# Patient Record
Sex: Female | Born: 1955 | Race: Black or African American | Hispanic: No | Smoking: Current every day smoker
Health system: Southern US, Community
[De-identification: ages and names within clinical notes are randomized; demographics above are authoritative.]

## PROBLEM LIST (undated history)

## (undated) DIAGNOSIS — R05 Cough: Secondary | ICD-10-CM

## (undated) DIAGNOSIS — J439 Emphysema, unspecified: Secondary | ICD-10-CM

## (undated) DIAGNOSIS — M549 Dorsalgia, unspecified: Secondary | ICD-10-CM

## (undated) DIAGNOSIS — M25569 Pain in unspecified knee: Secondary | ICD-10-CM

## (undated) DIAGNOSIS — R059 Cough, unspecified: Secondary | ICD-10-CM

## (undated) DIAGNOSIS — F32A Depression, unspecified: Secondary | ICD-10-CM

## (undated) DIAGNOSIS — F329 Major depressive disorder, single episode, unspecified: Secondary | ICD-10-CM

## (undated) DIAGNOSIS — G8929 Other chronic pain: Secondary | ICD-10-CM

## (undated) DIAGNOSIS — R0600 Dyspnea, unspecified: Secondary | ICD-10-CM

## (undated) DIAGNOSIS — C3490 Malignant neoplasm of unspecified part of unspecified bronchus or lung: Secondary | ICD-10-CM

## (undated) DIAGNOSIS — B182 Chronic viral hepatitis C: Secondary | ICD-10-CM

## (undated) DIAGNOSIS — M25519 Pain in unspecified shoulder: Secondary | ICD-10-CM

## (undated) DIAGNOSIS — R011 Cardiac murmur, unspecified: Secondary | ICD-10-CM

## (undated) DIAGNOSIS — Z9851 Tubal ligation status: Secondary | ICD-10-CM

## (undated) DIAGNOSIS — R1310 Dysphagia, unspecified: Secondary | ICD-10-CM

## (undated) DIAGNOSIS — I10 Essential (primary) hypertension: Secondary | ICD-10-CM

## (undated) HISTORY — DX: Cough, unspecified: R05.9

## (undated) HISTORY — DX: Dorsalgia, unspecified: M54.9

## (undated) HISTORY — DX: Chronic viral hepatitis C: B18.2

## (undated) HISTORY — PX: LUNG BIOPSY: SHX232

## (undated) HISTORY — DX: Emphysema, unspecified: J43.9

## (undated) HISTORY — DX: Malignant neoplasm of unspecified part of unspecified bronchus or lung: C34.90

## (undated) HISTORY — DX: Depression, unspecified: F32.A

## (undated) HISTORY — PX: HEMORROIDECTOMY: SUR656

## (undated) HISTORY — DX: Major depressive disorder, single episode, unspecified: F32.9

## (undated) HISTORY — DX: Dysphagia, unspecified: R13.10

## (undated) HISTORY — PX: TUBAL LIGATION: SHX77

## (undated) HISTORY — DX: Other chronic pain: G89.29

## (undated) HISTORY — DX: Dyspnea, unspecified: R06.00

## (undated) HISTORY — DX: Cough: R05

## (undated) HISTORY — DX: Cardiac murmur, unspecified: R01.1

---

## 2004-08-12 ENCOUNTER — Ambulatory Visit: Payer: Self-pay

## 2005-07-20 ENCOUNTER — Emergency Department: Payer: Self-pay | Admitting: Emergency Medicine

## 2005-07-22 ENCOUNTER — Emergency Department: Payer: Self-pay | Admitting: Emergency Medicine

## 2005-07-23 ENCOUNTER — Other Ambulatory Visit: Payer: Self-pay

## 2005-09-17 ENCOUNTER — Emergency Department: Payer: Self-pay | Admitting: Internal Medicine

## 2005-09-22 ENCOUNTER — Emergency Department: Payer: Self-pay | Admitting: Emergency Medicine

## 2005-10-17 ENCOUNTER — Emergency Department: Payer: Self-pay | Admitting: Emergency Medicine

## 2006-01-26 ENCOUNTER — Emergency Department: Payer: Self-pay | Admitting: Emergency Medicine

## 2006-05-24 ENCOUNTER — Emergency Department: Payer: Self-pay | Admitting: Internal Medicine

## 2007-04-03 ENCOUNTER — Other Ambulatory Visit: Payer: Self-pay

## 2007-04-03 ENCOUNTER — Inpatient Hospital Stay: Payer: Self-pay | Admitting: Internal Medicine

## 2008-02-06 ENCOUNTER — Emergency Department: Payer: Self-pay | Admitting: Emergency Medicine

## 2008-11-08 ENCOUNTER — Emergency Department: Payer: Self-pay | Admitting: Emergency Medicine

## 2009-11-06 ENCOUNTER — Ambulatory Visit: Payer: Self-pay | Admitting: Adult Health

## 2011-05-02 ENCOUNTER — Inpatient Hospital Stay: Payer: Self-pay | Admitting: Internal Medicine

## 2011-07-08 ENCOUNTER — Ambulatory Visit: Payer: Self-pay | Admitting: Internal Medicine

## 2011-07-17 DIAGNOSIS — C3490 Malignant neoplasm of unspecified part of unspecified bronchus or lung: Secondary | ICD-10-CM

## 2011-07-17 HISTORY — DX: Malignant neoplasm of unspecified part of unspecified bronchus or lung: C34.90

## 2011-07-21 ENCOUNTER — Ambulatory Visit: Payer: Self-pay | Admitting: Oncology

## 2011-07-21 LAB — CBC CANCER CENTER
Basophil %: 0.9 %
Eosinophil #: 0 x10 3/mm (ref 0.0–0.7)
Eosinophil %: 0.5 %
HGB: 15.5 g/dL (ref 12.0–16.0)
Lymphocyte #: 1.5 x10 3/mm (ref 1.0–3.6)
Lymphocyte %: 24.9 %
MCHC: 34.3 g/dL (ref 32.0–36.0)
Monocyte #: 0.8 x10 3/mm — ABNORMAL HIGH (ref 0.0–0.7)
Monocyte %: 13.4 %
Neutrophil #: 3.6 x10 3/mm (ref 1.4–6.5)
Neutrophil %: 60.3 %
RDW: 14.6 % — ABNORMAL HIGH (ref 11.5–14.5)
WBC: 6 x10 3/mm (ref 3.6–11.0)

## 2011-07-21 LAB — PROTIME-INR
INR: 1
Prothrombin Time: 14 secs (ref 11.5–14.7)

## 2011-07-22 LAB — CEA: CEA: 2.5 ng/mL (ref 0.0–4.7)

## 2011-07-23 ENCOUNTER — Ambulatory Visit: Payer: Self-pay | Admitting: Oncology

## 2011-08-03 ENCOUNTER — Ambulatory Visit: Payer: Self-pay | Admitting: Oncology

## 2011-08-03 LAB — PROTIME-INR
INR: 1.1
Prothrombin Time: 14.2 secs (ref 11.5–14.7)

## 2011-08-03 LAB — APTT: Activated PTT: 26.6 secs (ref 23.6–35.9)

## 2011-08-07 LAB — PATHOLOGY REPORT

## 2011-08-17 ENCOUNTER — Ambulatory Visit: Payer: Self-pay | Admitting: Oncology

## 2011-08-19 LAB — CBC CANCER CENTER
Basophil %: 0.4 %
Eosinophil #: 0 x10 3/mm (ref 0.0–0.7)
Eosinophil %: 0.4 %
HCT: 41.7 % (ref 35.0–47.0)
HGB: 14.5 g/dL (ref 12.0–16.0)
Lymphocyte #: 1 x10 3/mm (ref 1.0–3.6)
MCV: 88 fL (ref 80–100)
Neutrophil %: 65.9 %
Platelet: 204 x10 3/mm (ref 150–440)
RBC: 4.74 10*6/uL (ref 3.80–5.20)
WBC: 4.7 x10 3/mm (ref 3.6–11.0)

## 2011-08-19 LAB — BASIC METABOLIC PANEL
Anion Gap: 11 (ref 7–16)
BUN: 9 mg/dL (ref 7–18)
Chloride: 96 mmol/L — ABNORMAL LOW (ref 98–107)
Co2: 25 mmol/L (ref 21–32)
EGFR (African American): >60
Glucose: 129 mg/dL — ABNORMAL HIGH (ref 65–99)
Potassium: 3.2 mmol/L — ABNORMAL LOW (ref 3.5–5.1)
Sodium: 132 mmol/L — ABNORMAL LOW (ref 136–145)

## 2011-08-26 LAB — BASIC METABOLIC PANEL
Calcium, Total: 8.8 mg/dL (ref 8.5–10.1)
Chloride: 102 mmol/L (ref 98–107)
EGFR (African American): >60
EGFR (Non-African Amer.): >60
Glucose: 122 mg/dL — ABNORMAL HIGH (ref 65–99)
Potassium: 3.6 mmol/L (ref 3.5–5.1)
Sodium: 137 mmol/L (ref 136–145)

## 2011-08-26 LAB — CBC CANCER CENTER
Basophil #: 0 x10 3/mm (ref 0.0–0.1)
Basophil %: 0.7 %
Eosinophil #: 0 x10 3/mm (ref 0.0–0.7)
Eosinophil %: 1.1 %
HGB: 13.6 g/dL (ref 12.0–16.0)
Lymphocyte %: 15.8 %
MCH: 30.7 pg (ref 26.0–34.0)
MCHC: 34.6 g/dL (ref 32.0–36.0)
MCV: 89 fL (ref 80–100)
Neutrophil %: 72.9 %
Platelet: 289 x10 3/mm (ref 150–440)
RBC: 4.42 10*6/uL (ref 3.80–5.20)
RDW: 14.8 % — ABNORMAL HIGH (ref 11.5–14.5)

## 2011-09-02 LAB — CBC CANCER CENTER
Basophil %: 0.5 %
Eosinophil %: 0.6 %
HCT: 39.8 % (ref 35.0–47.0)
HGB: 13 g/dL (ref 12.0–16.0)
Lymphocyte %: 11.8 %
MCH: 29.7 pg (ref 26.0–34.0)
MCHC: 32.7 g/dL (ref 32.0–36.0)
Monocyte #: 0.3 x10 3/mm (ref 0.2–0.9)
Monocyte %: 13.5 %
Neutrophil #: 1.8 x10 3/mm (ref 1.4–6.5)
Neutrophil %: 73.6 %
WBC: 2.5 x10 3/mm — ABNORMAL LOW (ref 3.6–11.0)

## 2011-09-02 LAB — BASIC METABOLIC PANEL
Anion Gap: 11 (ref 7–16)
BUN: 9 mg/dL (ref 7–18)
Chloride: 99 mmol/L (ref 98–107)
Glucose: 168 mg/dL — ABNORMAL HIGH (ref 65–99)
Osmolality: 275 (ref 275–301)
Potassium: 3.6 mmol/L (ref 3.5–5.1)
Sodium: 136 mmol/L (ref 136–145)

## 2011-09-09 LAB — COMPREHENSIVE METABOLIC PANEL
Albumin: 3.3 g/dL — ABNORMAL LOW (ref 3.4–5.0)
Alkaline Phosphatase: 247 U/L — ABNORMAL HIGH (ref 50–136)
Anion Gap: 8 (ref 7–16)
BUN: 13 mg/dL (ref 7–18)
Bilirubin,Total: 0.4 mg/dL (ref 0.2–1.0)
Co2: 29 mmol/L (ref 21–32)
Creatinine: 0.51 mg/dL — ABNORMAL LOW (ref 0.60–1.30)
EGFR (African American): >60
EGFR (Non-African Amer.): >60
Glucose: 122 mg/dL — ABNORMAL HIGH (ref 65–99)
Osmolality: 281 (ref 275–301)
Potassium: 3.8 mmol/L (ref 3.5–5.1)
SGOT(AST): 66 U/L — ABNORMAL HIGH (ref 15–37)
Sodium: 140 mmol/L (ref 136–145)

## 2011-09-09 LAB — APTT: Activated PTT: 25.8 secs (ref 23.6–35.9)

## 2011-09-09 LAB — CBC CANCER CENTER
Eosinophil %: 0.1 %
HCT: 38.6 % (ref 35.0–47.0)
HGB: 12.9 g/dL (ref 12.0–16.0)
Lymphocyte #: 0.4 x10 3/mm — ABNORMAL LOW (ref 1.0–3.6)
MCH: 30.3 pg (ref 26.0–34.0)
MCHC: 33.4 g/dL (ref 32.0–36.0)
MCV: 91 fL (ref 80–100)
Monocyte #: 0.4 x10 3/mm (ref 0.2–0.9)
Neutrophil %: 72.8 %
RBC: 4.24 10*6/uL (ref 3.80–5.20)
RDW: 14.7 % — ABNORMAL HIGH (ref 11.5–14.5)

## 2011-09-09 LAB — PROTIME-INR
INR: 1
Prothrombin Time: 13.1 secs (ref 11.5–14.7)

## 2011-09-16 ENCOUNTER — Ambulatory Visit: Payer: Self-pay | Admitting: Oncology

## 2011-09-16 ENCOUNTER — Ambulatory Visit: Payer: Self-pay

## 2011-09-16 LAB — CBC CANCER CENTER
Basophil #: 0 x10 3/mm (ref 0.0–0.1)
Basophil %: 0.8 %
Eosinophil #: 0 x10 3/mm (ref 0.0–0.7)
Eosinophil %: 0.3 %
HGB: 12.3 g/dL (ref 12.0–16.0)
Lymphocyte #: 0.3 x10 3/mm — ABNORMAL LOW (ref 1.0–3.6)
Lymphocyte %: 14.2 %
MCH: 30.4 pg (ref 26.0–34.0)
MCHC: 33.5 g/dL (ref 32.0–36.0)
Monocyte #: 0.3 x10 3/mm (ref 0.2–0.9)
Monocyte %: 18 %
Neutrophil #: 1.3 x10 3/mm — ABNORMAL LOW (ref 1.4–6.5)
Neutrophil %: 66.7 %
Platelet: 202 x10 3/mm (ref 150–440)
RBC: 4.06 10*6/uL (ref 3.80–5.20)
RDW: 15.3 % — ABNORMAL HIGH (ref 11.5–14.5)
WBC: 1.9 x10 3/mm — CL (ref 3.6–11.0)

## 2011-09-16 LAB — COMPREHENSIVE METABOLIC PANEL
Alkaline Phosphatase: 200 U/L — ABNORMAL HIGH (ref 50–136)
BUN: 10 mg/dL (ref 7–18)
Bilirubin,Total: 0.5 mg/dL (ref 0.2–1.0)
Calcium, Total: 9 mg/dL (ref 8.5–10.1)
Chloride: 106 mmol/L (ref 98–107)
Co2: 24 mmol/L (ref 21–32)
EGFR (Non-African Amer.): >60
Total Protein: 8.2 g/dL (ref 6.4–8.2)

## 2011-09-16 LAB — PROTIME-INR: INR: 0.8

## 2011-09-18 LAB — CBC CANCER CENTER
Basophil #: 0 x10 3/mm (ref 0.0–0.1)
Basophil %: 0.4 %
Eosinophil %: 0.2 %
HCT: 37.5 % (ref 35.0–47.0)
HGB: 12.5 g/dL (ref 12.0–16.0)
Lymphocyte #: 0.4 x10 3/mm — ABNORMAL LOW (ref 1.0–3.6)
Lymphocyte %: 15 %
MCHC: 33.3 g/dL (ref 32.0–36.0)
MCV: 91 fL (ref 80–100)
RBC: 4.11 10*6/uL (ref 3.80–5.20)

## 2011-09-18 LAB — COMPREHENSIVE METABOLIC PANEL
Albumin: 3.4 g/dL (ref 3.4–5.0)
Alkaline Phosphatase: 228 U/L — ABNORMAL HIGH (ref 50–136)
BUN: 9 mg/dL (ref 7–18)
Bilirubin,Total: 0.6 mg/dL (ref 0.2–1.0)
Calcium, Total: 9.2 mg/dL (ref 8.5–10.1)
Chloride: 102 mmol/L (ref 98–107)
Co2: 25 mmol/L (ref 21–32)
Creatinine: 0.59 mg/dL — ABNORMAL LOW (ref 0.60–1.30)
EGFR (African American): >60
Glucose: 102 mg/dL — ABNORMAL HIGH (ref 65–99)
Osmolality: 275 (ref 275–301)
Potassium: 3.8 mmol/L (ref 3.5–5.1)
SGOT(AST): 92 U/L — ABNORMAL HIGH (ref 15–37)
SGPT (ALT): 87 U/L — ABNORMAL HIGH
Total Protein: 8.4 g/dL — ABNORMAL HIGH (ref 6.4–8.2)

## 2011-09-23 LAB — CBC CANCER CENTER
Basophil #: 0 x10 3/mm (ref 0.0–0.1)
Basophil %: 0.7 %
Eosinophil #: 0 x10 3/mm (ref 0.0–0.7)
Eosinophil %: 0.4 %
HCT: 35.9 % (ref 35.0–47.0)
HGB: 12 g/dL (ref 12.0–16.0)
Lymphocyte #: 0.3 x10 3/mm — ABNORMAL LOW (ref 1.0–3.6)
Lymphocyte %: 15.4 %
MCH: 30.8 pg (ref 26.0–34.0)
MCHC: 33.4 g/dL (ref 32.0–36.0)
MCV: 92 fL (ref 80–100)
Monocyte #: 0.1 x10 3/mm — ABNORMAL LOW (ref 0.2–0.9)
Monocyte %: 8.4 %
Neutrophil #: 1.2 x10 3/mm — ABNORMAL LOW (ref 1.4–6.5)
Neutrophil %: 75.1 %
Platelet: 187 x10 3/mm (ref 150–440)
RBC: 3.89 10*6/uL (ref 3.80–5.20)
RDW: 16.4 % — ABNORMAL HIGH (ref 11.5–14.5)
WBC: 1.6 x10 3/mm — CL (ref 3.6–11.0)

## 2011-09-24 ENCOUNTER — Ambulatory Visit: Payer: Self-pay | Admitting: Vascular Surgery

## 2011-09-30 LAB — COMPREHENSIVE METABOLIC PANEL
Alkaline Phosphatase: 197 U/L — ABNORMAL HIGH (ref 50–136)
Anion Gap: 10 (ref 7–16)
Bilirubin,Total: 0.5 mg/dL (ref 0.2–1.0)
Calcium, Total: 8.8 mg/dL (ref 8.5–10.1)
Chloride: 103 mmol/L (ref 98–107)
Creatinine: 0.59 mg/dL — ABNORMAL LOW (ref 0.60–1.30)
EGFR (African American): >60
Osmolality: 281 (ref 275–301)
Potassium: 4 mmol/L (ref 3.5–5.1)
Total Protein: 8.1 g/dL (ref 6.4–8.2)

## 2011-09-30 LAB — CBC CANCER CENTER
Basophil #: 0 x10 3/mm (ref 0.0–0.1)
HCT: 35.5 % (ref 35.0–47.0)
Lymphocyte #: 0.4 x10 3/mm — ABNORMAL LOW (ref 1.0–3.6)
MCHC: 33.5 g/dL (ref 32.0–36.0)
MCV: 94 fL (ref 80–100)
Neutrophil #: 1.2 x10 3/mm — ABNORMAL LOW (ref 1.4–6.5)
Neutrophil %: 53.2 %
WBC: 2.3 x10 3/mm — ABNORMAL LOW (ref 3.6–11.0)

## 2011-10-07 LAB — COMPREHENSIVE METABOLIC PANEL
Alkaline Phosphatase: 163 U/L — ABNORMAL HIGH (ref 50–136)
BUN: 12 mg/dL (ref 7–18)
Calcium, Total: 9 mg/dL (ref 8.5–10.1)
Chloride: 103 mmol/L (ref 98–107)
EGFR (African American): >60
EGFR (Non-African Amer.): >60
Glucose: 103 mg/dL — ABNORMAL HIGH (ref 65–99)
SGPT (ALT): 90 U/L — ABNORMAL HIGH
Total Protein: 7.9 g/dL (ref 6.4–8.2)

## 2011-10-07 LAB — CBC CANCER CENTER
Eosinophil %: 0.3 %
HCT: 34.7 % — ABNORMAL LOW (ref 35.0–47.0)
HGB: 11.6 g/dL — ABNORMAL LOW (ref 12.0–16.0)
Lymphocyte #: 0.3 x10 3/mm — ABNORMAL LOW (ref 1.0–3.6)
Lymphocyte %: 12.3 %
Monocyte #: 0.3 x10 3/mm (ref 0.2–0.9)
Monocyte %: 12.5 %
Neutrophil %: 74.8 %
RDW: 20.6 % — ABNORMAL HIGH (ref 11.5–14.5)
WBC: 2.7 x10 3/mm — ABNORMAL LOW (ref 3.6–11.0)

## 2011-10-17 ENCOUNTER — Ambulatory Visit: Payer: Self-pay | Admitting: Oncology

## 2011-11-04 LAB — CBC CANCER CENTER
Basophil #: 0 x10 3/mm (ref 0.0–0.1)
Eosinophil #: 0 x10 3/mm (ref 0.0–0.7)
HCT: 39.4 % (ref 35.0–47.0)
Monocyte #: 0.5 x10 3/mm (ref 0.2–0.9)
Neutrophil #: 2.1 x10 3/mm (ref 1.4–6.5)
Neutrophil %: 57.5 %
Platelet: 158 x10 3/mm (ref 150–440)
RBC: 3.91 10*6/uL (ref 3.80–5.20)
RDW: 21.5 % — ABNORMAL HIGH (ref 11.5–14.5)

## 2011-11-04 LAB — COMPREHENSIVE METABOLIC PANEL
Albumin: 3.6 g/dL (ref 3.4–5.0)
Anion Gap: 9 (ref 7–16)
BUN: 12 mg/dL (ref 7–18)
Bilirubin,Total: 0.6 mg/dL (ref 0.2–1.0)
Calcium, Total: 9.5 mg/dL (ref 8.5–10.1)
Chloride: 103 mmol/L (ref 98–107)
EGFR (Non-African Amer.): >60
Glucose: 99 mg/dL (ref 65–99)
Potassium: 4.2 mmol/L (ref 3.5–5.1)
SGPT (ALT): 66 U/L
Sodium: 136 mmol/L (ref 136–145)
Total Protein: 8.7 g/dL — ABNORMAL HIGH (ref 6.4–8.2)

## 2011-11-16 ENCOUNTER — Ambulatory Visit: Payer: Self-pay | Admitting: Oncology

## 2011-12-02 LAB — CBC CANCER CENTER
Basophil #: 0 x10 3/mm (ref 0.0–0.1)
Eosinophil #: 0 x10 3/mm (ref 0.0–0.7)
Eosinophil %: 1.1 %
HCT: 42.9 % (ref 35.0–47.0)
HGB: 14.4 g/dL (ref 12.0–16.0)
MCH: 34.8 pg — ABNORMAL HIGH (ref 26.0–34.0)
MCHC: 33.6 g/dL (ref 32.0–36.0)
MCV: 104 fL — ABNORMAL HIGH (ref 80–100)
Monocyte #: 0.4 x10 3/mm (ref 0.2–0.9)
Platelet: 176 x10 3/mm (ref 150–440)
RDW: 15.7 % — ABNORMAL HIGH (ref 11.5–14.5)

## 2011-12-02 LAB — COMPREHENSIVE METABOLIC PANEL
Albumin: 3.6 g/dL (ref 3.4–5.0)
Alkaline Phosphatase: 186 U/L — ABNORMAL HIGH (ref 50–136)
Bilirubin,Total: 0.6 mg/dL (ref 0.2–1.0)
Co2: 24 mmol/L (ref 21–32)
Creatinine: 0.66 mg/dL (ref 0.60–1.30)
EGFR (Non-African Amer.): >60
Glucose: 94 mg/dL (ref 65–99)
Osmolality: 275 (ref 275–301)
Sodium: 138 mmol/L (ref 136–145)
Total Protein: 8.7 g/dL — ABNORMAL HIGH (ref 6.4–8.2)

## 2011-12-09 LAB — CBC CANCER CENTER
Basophil %: 0.7 %
Eosinophil #: 0 x10 3/mm (ref 0.0–0.7)
Eosinophil %: 0.9 %
Lymphocyte #: 0.6 x10 3/mm — ABNORMAL LOW (ref 1.0–3.6)
Lymphocyte %: 21.9 %
MCH: 35.1 pg — ABNORMAL HIGH (ref 26.0–34.0)
MCV: 105 fL — ABNORMAL HIGH (ref 80–100)
Monocyte %: 15.1 %
Neutrophil #: 1.8 x10 3/mm (ref 1.4–6.5)
Platelet: 184 x10 3/mm (ref 150–440)
RBC: 4.03 10*6/uL (ref 3.80–5.20)
RDW: 14.5 % (ref 11.5–14.5)
WBC: 2.9 x10 3/mm — ABNORMAL LOW (ref 3.6–11.0)

## 2011-12-09 LAB — COMPREHENSIVE METABOLIC PANEL
Albumin: 3.6 g/dL (ref 3.4–5.0)
Alkaline Phosphatase: 197 U/L — ABNORMAL HIGH (ref 50–136)
Anion Gap: 10 (ref 7–16)
BUN: 10 mg/dL (ref 7–18)
Calcium, Total: 9.4 mg/dL (ref 8.5–10.1)
Chloride: 103 mmol/L (ref 98–107)
Creatinine: 0.67 mg/dL (ref 0.60–1.30)
EGFR (African American): >60
Glucose: 138 mg/dL — ABNORMAL HIGH (ref 65–99)
Potassium: 2.9 mmol/L — ABNORMAL LOW (ref 3.5–5.1)
SGOT(AST): 113 U/L — ABNORMAL HIGH (ref 15–37)
SGPT (ALT): 87 U/L — ABNORMAL HIGH
Total Protein: 8.7 g/dL — ABNORMAL HIGH (ref 6.4–8.2)

## 2011-12-16 LAB — CBC CANCER CENTER
Basophil #: 0 x10 3/mm (ref 0.0–0.1)
Eosinophil #: 0 x10 3/mm (ref 0.0–0.7)
HGB: 13.6 g/dL (ref 12.0–16.0)
Lymphocyte %: 31.1 %
MCH: 36.1 pg — ABNORMAL HIGH (ref 26.0–34.0)
MCV: 105 fL — ABNORMAL HIGH (ref 80–100)
Monocyte #: 0.1 x10 3/mm — ABNORMAL LOW (ref 0.2–0.9)
Monocyte %: 5.6 %
Neutrophil #: 0.8 x10 3/mm — ABNORMAL LOW (ref 1.4–6.5)
Neutrophil %: 60.5 %
RBC: 3.76 10*6/uL — ABNORMAL LOW (ref 3.80–5.20)

## 2011-12-17 ENCOUNTER — Ambulatory Visit: Payer: Self-pay | Admitting: Oncology

## 2011-12-23 LAB — CBC CANCER CENTER
Basophil %: 1.3 %
Eosinophil #: 0 x10 3/mm (ref 0.0–0.7)
HCT: 39.2 % (ref 35.0–47.0)
HGB: 13.7 g/dL (ref 12.0–16.0)
MCH: 36.5 pg — ABNORMAL HIGH (ref 26.0–34.0)
MCHC: 35 g/dL (ref 32.0–36.0)
Monocyte #: 0.8 x10 3/mm (ref 0.2–0.9)
Neutrophil #: 0.2 x10 3/mm — ABNORMAL LOW (ref 1.4–6.5)

## 2011-12-30 LAB — BASIC METABOLIC PANEL
Anion Gap: 10 (ref 7–16)
BUN: 12 mg/dL (ref 7–18)
Calcium, Total: 9.1 mg/dL (ref 8.5–10.1)
Co2: 25 mmol/L (ref 21–32)
Creatinine: 0.65 mg/dL (ref 0.60–1.30)
EGFR (Non-African Amer.): >60
Glucose: 102 mg/dL — ABNORMAL HIGH (ref 65–99)
Osmolality: 272 (ref 275–301)
Potassium: 3.7 mmol/L (ref 3.5–5.1)
Sodium: 136 mmol/L (ref 136–145)

## 2011-12-30 LAB — CBC CANCER CENTER
Eosinophil %: 0.2 %
Lymphocyte %: 25.6 %
MCH: 34.6 pg — ABNORMAL HIGH (ref 26.0–34.0)
Monocyte #: 0.6 x10 3/mm (ref 0.2–0.9)
Monocyte %: 17.4 %
Neutrophil %: 56.2 %
Platelet: 112 x10 3/mm — ABNORMAL LOW (ref 150–440)
RBC: 3.9 10*6/uL (ref 3.80–5.20)
WBC: 3.2 x10 3/mm — ABNORMAL LOW (ref 3.6–11.0)

## 2011-12-30 LAB — MAGNESIUM: Magnesium: 1.7 mg/dL — ABNORMAL LOW

## 2012-01-06 LAB — CBC CANCER CENTER
Basophil #: 0 x10 3/mm (ref 0.0–0.1)
Eosinophil #: 0 x10 3/mm (ref 0.0–0.7)
HCT: 37.1 % (ref 35.0–47.0)
Lymphocyte #: 0.4 x10 3/mm — ABNORMAL LOW (ref 1.0–3.6)
Lymphocyte %: 29.4 %
MCHC: 33.7 g/dL (ref 32.0–36.0)
MCV: 103 fL — ABNORMAL HIGH (ref 80–100)
Monocyte #: 0.1 x10 3/mm — ABNORMAL LOW (ref 0.2–0.9)
Monocyte %: 4.9 %
Neutrophil #: 0.9 x10 3/mm — ABNORMAL LOW (ref 1.4–6.5)
Platelet: 63 x10 3/mm — ABNORMAL LOW (ref 150–440)
RDW: 13.3 % (ref 11.5–14.5)
WBC: 1.5 x10 3/mm — CL (ref 3.6–11.0)

## 2012-01-06 LAB — COMPREHENSIVE METABOLIC PANEL
Alkaline Phosphatase: 204 U/L — ABNORMAL HIGH (ref 50–136)
BUN: 10 mg/dL (ref 7–18)
Bilirubin,Total: 0.6 mg/dL (ref 0.2–1.0)
Chloride: 101 mmol/L (ref 98–107)
Co2: 28 mmol/L (ref 21–32)
Creatinine: 0.68 mg/dL (ref 0.60–1.30)
EGFR (African American): >60
Osmolality: 275 (ref 275–301)
Potassium: 3.5 mmol/L (ref 3.5–5.1)
SGPT (ALT): 122 U/L — ABNORMAL HIGH (ref 12–78)
Sodium: 138 mmol/L (ref 136–145)
Total Protein: 8.5 g/dL — ABNORMAL HIGH (ref 6.4–8.2)

## 2012-01-13 LAB — CBC CANCER CENTER
Basophil #: 0 x10 3/mm (ref 0.0–0.1)
HCT: 35.8 % (ref 35.0–47.0)
HGB: 12 g/dL (ref 12.0–16.0)
Lymphocyte #: 0.5 x10 3/mm — ABNORMAL LOW (ref 1.0–3.6)
MCH: 34.6 pg — ABNORMAL HIGH (ref 26.0–34.0)
MCHC: 33.7 g/dL (ref 32.0–36.0)
MCV: 103 fL — ABNORMAL HIGH (ref 80–100)
Monocyte #: 0.7 x10 3/mm (ref 0.2–0.9)
Monocyte %: 43.3 %
RDW: 13.4 % (ref 11.5–14.5)

## 2012-01-13 LAB — COMPREHENSIVE METABOLIC PANEL
Albumin: 3.3 g/dL — ABNORMAL LOW (ref 3.4–5.0)
Alkaline Phosphatase: 233 U/L — ABNORMAL HIGH (ref 50–136)
Anion Gap: 9 (ref 7–16)
BUN: 8 mg/dL (ref 7–18)
Bilirubin,Total: 0.5 mg/dL (ref 0.2–1.0)
Creatinine: 0.7 mg/dL (ref 0.60–1.30)
EGFR (African American): >60
Glucose: 102 mg/dL — ABNORMAL HIGH (ref 65–99)
Osmolality: 270 (ref 275–301)
Potassium: 3.4 mmol/L — ABNORMAL LOW (ref 3.5–5.1)
SGOT(AST): 110 U/L — ABNORMAL HIGH (ref 15–37)
SGPT (ALT): 90 U/L — ABNORMAL HIGH (ref 12–78)
Total Protein: 8.6 g/dL — ABNORMAL HIGH (ref 6.4–8.2)

## 2012-01-17 ENCOUNTER — Ambulatory Visit: Payer: Self-pay | Admitting: Oncology

## 2012-01-20 LAB — CBC CANCER CENTER
Basophil #: 0 x10 3/mm (ref 0.0–0.1)
Basophil %: 0.4 %
Eosinophil #: 0 x10 3/mm (ref 0.0–0.7)
HCT: 36.4 % (ref 35.0–47.0)
HGB: 12.3 g/dL (ref 12.0–16.0)
Lymphocyte %: 13.6 %
MCH: 35 pg — ABNORMAL HIGH (ref 26.0–34.0)
MCHC: 33.9 g/dL (ref 32.0–36.0)
MCV: 103 fL — ABNORMAL HIGH (ref 80–100)
Monocyte #: 0.5 x10 3/mm (ref 0.2–0.9)
Neutrophil #: 2.6 x10 3/mm (ref 1.4–6.5)
Neutrophil %: 71.6 %
RBC: 3.52 10*6/uL — ABNORMAL LOW (ref 3.80–5.20)
RDW: 13.8 % (ref 11.5–14.5)
WBC: 3.7 x10 3/mm (ref 3.6–11.0)

## 2012-01-20 LAB — COMPREHENSIVE METABOLIC PANEL
Alkaline Phosphatase: 222 U/L — ABNORMAL HIGH (ref 50–136)
Calcium, Total: 9.3 mg/dL (ref 8.5–10.1)
Creatinine: 0.9 mg/dL (ref 0.60–1.30)
EGFR (African American): >60
EGFR (Non-African Amer.): >60
Glucose: 102 mg/dL — ABNORMAL HIGH (ref 65–99)
SGOT(AST): 108 U/L — ABNORMAL HIGH (ref 15–37)
SGPT (ALT): 93 U/L — ABNORMAL HIGH (ref 12–78)
Sodium: 140 mmol/L (ref 136–145)
Total Protein: 9 g/dL — ABNORMAL HIGH (ref 6.4–8.2)

## 2012-02-16 ENCOUNTER — Ambulatory Visit: Payer: Self-pay | Admitting: Oncology

## 2012-03-18 ENCOUNTER — Ambulatory Visit: Payer: Self-pay | Admitting: Oncology

## 2012-06-09 ENCOUNTER — Ambulatory Visit: Payer: Self-pay | Admitting: Oncology

## 2012-06-13 ENCOUNTER — Ambulatory Visit: Payer: Self-pay | Admitting: Oncology

## 2012-06-18 ENCOUNTER — Ambulatory Visit: Payer: Self-pay | Admitting: Oncology

## 2012-07-26 ENCOUNTER — Ambulatory Visit: Payer: Self-pay

## 2012-08-16 ENCOUNTER — Ambulatory Visit: Payer: Self-pay | Admitting: Oncology

## 2012-09-12 LAB — COMPREHENSIVE METABOLIC PANEL
Albumin: 3.7 g/dL (ref 3.4–5.0)
Anion Gap: 14 (ref 7–16)
BUN: 10 mg/dL (ref 7–18)
Bilirubin,Total: 0.9 mg/dL (ref 0.2–1.0)
Calcium, Total: 9.7 mg/dL (ref 8.5–10.1)
Chloride: 98 mmol/L (ref 98–107)
Co2: 23 mmol/L (ref 21–32)
EGFR (Non-African Amer.): >60
Glucose: 116 mg/dL — ABNORMAL HIGH (ref 65–99)
Potassium: 3.2 mmol/L — ABNORMAL LOW (ref 3.5–5.1)
SGOT(AST): 164 U/L — ABNORMAL HIGH (ref 15–37)
SGPT (ALT): 122 U/L — ABNORMAL HIGH (ref 12–78)
Total Protein: 9.7 g/dL — ABNORMAL HIGH (ref 6.4–8.2)

## 2012-09-12 LAB — CBC CANCER CENTER
Basophil %: 1.1 %
Eosinophil %: 1.9 %
HCT: 46.4 % (ref 35.0–47.0)
HGB: 16 g/dL (ref 12.0–16.0)
Lymphocyte #: 1.1 x10 3/mm (ref 1.0–3.6)
Lymphocyte %: 28.2 %
MCH: 33.2 pg (ref 26.0–34.0)
MCV: 96 fL (ref 80–100)
Neutrophil #: 2.2 x10 3/mm (ref 1.4–6.5)
RBC: 4.83 10*6/uL (ref 3.80–5.20)

## 2012-09-15 ENCOUNTER — Ambulatory Visit: Payer: Self-pay | Admitting: Oncology

## 2012-09-21 ENCOUNTER — Ambulatory Visit: Payer: Self-pay | Admitting: Oncology

## 2012-10-16 ENCOUNTER — Ambulatory Visit: Payer: Self-pay | Admitting: Oncology

## 2012-11-30 ENCOUNTER — Other Ambulatory Visit: Payer: Self-pay | Admitting: Family Medicine

## 2012-12-16 ENCOUNTER — Ambulatory Visit: Payer: Self-pay | Admitting: Oncology

## 2012-12-22 LAB — PROTIME-INR: Prothrombin Time: 13.7 secs (ref 11.5–14.7)

## 2012-12-22 LAB — CBC CANCER CENTER
Eosinophil #: 0.1 x10 3/mm (ref 0.0–0.7)
HCT: 43.7 % (ref 35.0–47.0)
HGB: 15.5 g/dL (ref 12.0–16.0)
Lymphocyte #: 1 x10 3/mm (ref 1.0–3.6)
MCH: 33.7 pg (ref 26.0–34.0)
MCV: 95 fL (ref 80–100)
Monocyte %: 11.3 %
Neutrophil %: 52 %
Platelet: 213 x10 3/mm (ref 150–440)
RBC: 4.59 10*6/uL (ref 3.80–5.20)
RDW: 14.4 % (ref 11.5–14.5)
WBC: 3 x10 3/mm — ABNORMAL LOW (ref 3.6–11.0)

## 2012-12-22 LAB — COMPREHENSIVE METABOLIC PANEL
Alkaline Phosphatase: 205 U/L — ABNORMAL HIGH (ref 50–136)
Anion Gap: 12 (ref 7–16)
Bilirubin,Total: 0.6 mg/dL (ref 0.2–1.0)
Calcium, Total: 9.4 mg/dL (ref 8.5–10.1)
Co2: 26 mmol/L (ref 21–32)
Potassium: 3.1 mmol/L — ABNORMAL LOW (ref 3.5–5.1)
SGPT (ALT): 120 U/L — ABNORMAL HIGH (ref 12–78)

## 2012-12-27 ENCOUNTER — Ambulatory Visit: Payer: Self-pay | Admitting: Urgent Care

## 2013-01-12 LAB — COMPREHENSIVE METABOLIC PANEL
Albumin: 3.4 g/dL (ref 3.4–5.0)
Alkaline Phosphatase: 206 U/L — ABNORMAL HIGH (ref 50–136)
Anion Gap: 9 (ref 7–16)
Bilirubin,Total: 0.5 mg/dL (ref 0.2–1.0)
Calcium, Total: 9.6 mg/dL (ref 8.5–10.1)
Chloride: 101 mmol/L (ref 98–107)
Co2: 28 mmol/L (ref 21–32)
Creatinine: 0.69 mg/dL (ref 0.60–1.30)
EGFR (African American): >60
Osmolality: 272 (ref 275–301)
SGOT(AST): 87 U/L — ABNORMAL HIGH (ref 15–37)
SGPT (ALT): 94 U/L — ABNORMAL HIGH (ref 12–78)
Sodium: 138 mmol/L (ref 136–145)
Total Protein: 9.3 g/dL — ABNORMAL HIGH (ref 6.4–8.2)

## 2013-01-12 LAB — CBC CANCER CENTER
Basophil #: 0.1 x10 3/mm (ref 0.0–0.1)
Basophil %: 1.1 %
Eosinophil %: 4.7 %
HCT: 42.7 % (ref 35.0–47.0)
HGB: 14.8 g/dL (ref 12.0–16.0)
Lymphocyte %: 20.2 %
MCHC: 34.6 g/dL (ref 32.0–36.0)
MCV: 95 fL (ref 80–100)
Monocyte #: 0.6 x10 3/mm (ref 0.2–0.9)
Monocyte %: 13.3 %
Neutrophil #: 2.9 x10 3/mm (ref 1.4–6.5)
Platelet: 252 x10 3/mm (ref 150–440)

## 2013-01-16 ENCOUNTER — Ambulatory Visit: Payer: Self-pay | Admitting: Oncology

## 2013-02-16 ENCOUNTER — Ambulatory Visit: Payer: Self-pay | Admitting: Oncology

## 2013-02-16 LAB — CBC CANCER CENTER
Eosinophil #: 0.1 x10 3/mm (ref 0.0–0.7)
Eosinophil %: 2.1 %
HGB: 16 g/dL (ref 12.0–16.0)
Lymphocyte #: 1.1 x10 3/mm (ref 1.0–3.6)
Lymphocyte %: 21.2 %
MCH: 33.3 pg (ref 26.0–34.0)
MCHC: 34.5 g/dL (ref 32.0–36.0)
Monocyte #: 0.6 x10 3/mm (ref 0.2–0.9)
Neutrophil %: 63.6 %
RBC: 4.8 10*6/uL (ref 3.80–5.20)
RDW: 14.6 % — ABNORMAL HIGH (ref 11.5–14.5)

## 2013-02-16 LAB — COMPREHENSIVE METABOLIC PANEL
Anion Gap: 9 (ref 7–16)
Bilirubin,Total: 0.4 mg/dL (ref 0.2–1.0)
Chloride: 101 mmol/L (ref 98–107)
Co2: 27 mmol/L (ref 21–32)
Creatinine: 0.7 mg/dL (ref 0.60–1.30)
EGFR (African American): >60
EGFR (Non-African Amer.): >60
Glucose: 148 mg/dL — ABNORMAL HIGH (ref 65–99)
Sodium: 137 mmol/L (ref 136–145)
Total Protein: 9.3 g/dL — ABNORMAL HIGH (ref 6.4–8.2)

## 2013-03-18 ENCOUNTER — Ambulatory Visit: Payer: Self-pay | Admitting: Oncology

## 2013-05-18 ENCOUNTER — Ambulatory Visit: Payer: Self-pay | Admitting: Oncology

## 2013-05-22 LAB — URINALYSIS, COMPLETE
Bacteria: NONE SEEN
Glucose,UR: NEGATIVE mg/dL (ref 0–75)
Ketone: NEGATIVE
NITRITE: NEGATIVE
Ph: 6 (ref 4.5–8.0)
Protein: 75
Specific Gravity: 1.025 (ref 1.003–1.030)

## 2013-05-22 LAB — BASIC METABOLIC PANEL
ANION GAP: 4 — AB (ref 7–16)
BUN: 18 mg/dL (ref 7–18)
CALCIUM: 9.2 mg/dL (ref 8.5–10.1)
Chloride: 98 mmol/L (ref 98–107)
Co2: 28 mmol/L (ref 21–32)
Creatinine: 0.84 mg/dL (ref 0.60–1.30)
EGFR (African American): >60
EGFR (Non-African Amer.): >60
GLUCOSE: 116 mg/dL — AB (ref 65–99)
Osmolality: 264 (ref 275–301)
Potassium: 3 mmol/L — ABNORMAL LOW (ref 3.5–5.1)
Sodium: 130 mmol/L — ABNORMAL LOW (ref 136–145)

## 2013-05-22 LAB — CBC
HCT: 42.6 % (ref 35.0–47.0)
HGB: 14.7 g/dL (ref 12.0–16.0)
MCH: 32 pg (ref 26.0–34.0)
MCHC: 34.5 g/dL (ref 32.0–36.0)
MCV: 93 fL (ref 80–100)
Platelet: 221 10*3/uL (ref 150–440)
RBC: 4.6 10*6/uL (ref 3.80–5.20)
RDW: 14.2 % (ref 11.5–14.5)
WBC: 10.7 10*3/uL (ref 3.6–11.0)

## 2013-05-22 LAB — TROPONIN I: Troponin-I: <0.02 ng/mL

## 2013-05-23 ENCOUNTER — Inpatient Hospital Stay: Payer: Self-pay | Admitting: Specialist

## 2013-05-23 LAB — BASIC METABOLIC PANEL
Anion Gap: 6 — ABNORMAL LOW (ref 7–16)
BUN: 17 mg/dL (ref 7–18)
CALCIUM: 9.6 mg/dL (ref 8.5–10.1)
CHLORIDE: 101 mmol/L (ref 98–107)
Co2: 24 mmol/L (ref 21–32)
Creatinine: 0.78 mg/dL (ref 0.60–1.30)
EGFR (African American): >60
EGFR (Non-African Amer.): >60
GLUCOSE: 199 mg/dL — AB (ref 65–99)
OSMOLALITY: 270 (ref 275–301)
Potassium: 3.4 mmol/L — ABNORMAL LOW (ref 3.5–5.1)
Sodium: 131 mmol/L — ABNORMAL LOW (ref 136–145)

## 2013-05-23 LAB — MAGNESIUM: Magnesium: 2.2 mg/dL

## 2013-05-23 LAB — RAPID INFLUENZA A&B ANTIGENS (ARMC ONLY)

## 2013-05-24 LAB — CBC WITH DIFFERENTIAL/PLATELET
BASOS PCT: 0.4 %
Basophil #: 0 10*3/uL (ref 0.0–0.1)
EOS ABS: 0 10*3/uL (ref 0.0–0.7)
EOS PCT: 0.2 %
HCT: 36.4 % (ref 35.0–47.0)
HGB: 12.7 g/dL (ref 12.0–16.0)
Lymphocyte #: 1.2 10*3/uL (ref 1.0–3.6)
Lymphocyte %: 14 %
MCH: 32.4 pg (ref 26.0–34.0)
MCHC: 34.9 g/dL (ref 32.0–36.0)
MCV: 93 fL (ref 80–100)
Monocyte #: 1 x10 3/mm — ABNORMAL HIGH (ref 0.2–0.9)
Monocyte %: 12.5 %
Neutrophil #: 6.1 10*3/uL (ref 1.4–6.5)
Neutrophil %: 72.9 %
PLATELETS: 222 10*3/uL (ref 150–440)
RBC: 3.92 10*6/uL (ref 3.80–5.20)
RDW: 14 % (ref 11.5–14.5)
WBC: 8.4 10*3/uL (ref 3.6–11.0)

## 2013-05-24 LAB — BASIC METABOLIC PANEL
Anion Gap: 7 (ref 7–16)
BUN: 11 mg/dL (ref 7–18)
CO2: 22 mmol/L (ref 21–32)
CREATININE: 0.73 mg/dL (ref 0.60–1.30)
Calcium, Total: 9 mg/dL (ref 8.5–10.1)
Chloride: 107 mmol/L (ref 98–107)
EGFR (Non-African Amer.): >60
Glucose: 102 mg/dL — ABNORMAL HIGH (ref 65–99)
OSMOLALITY: 272 (ref 275–301)
POTASSIUM: 3.4 mmol/L — AB (ref 3.5–5.1)
Sodium: 136 mmol/L (ref 136–145)

## 2013-05-24 LAB — URINE CULTURE

## 2013-05-27 LAB — EXPECTORATED SPUTUM ASSESSMENT W REFEX TO RESP CULTURE

## 2013-05-29 ENCOUNTER — Ambulatory Visit: Payer: Self-pay | Admitting: Oncology

## 2013-06-07 LAB — CBC CANCER CENTER
BASOS ABS: 0.1 x10 3/mm (ref 0.0–0.1)
BASOS PCT: 0.9 %
EOS ABS: 0.1 x10 3/mm (ref 0.0–0.7)
Eosinophil %: 1 %
HCT: 41.9 % (ref 35.0–47.0)
HGB: 14.1 g/dL (ref 12.0–16.0)
Lymphocyte #: 1.3 x10 3/mm (ref 1.0–3.6)
Lymphocyte %: 21.5 %
MCH: 31.8 pg (ref 26.0–34.0)
MCHC: 33.7 g/dL (ref 32.0–36.0)
MCV: 94 fL (ref 80–100)
MONO ABS: 0.7 x10 3/mm (ref 0.2–0.9)
Monocyte %: 11.3 %
NEUTROS PCT: 65.3 %
Neutrophil #: 4.1 x10 3/mm (ref 1.4–6.5)
PLATELETS: 240 x10 3/mm (ref 150–440)
RBC: 4.45 10*6/uL (ref 3.80–5.20)
RDW: 14.5 % (ref 11.5–14.5)
WBC: 6.2 x10 3/mm (ref 3.6–11.0)

## 2013-06-07 LAB — COMPREHENSIVE METABOLIC PANEL
ALBUMIN: 2.9 g/dL — AB (ref 3.4–5.0)
ANION GAP: 10 (ref 7–16)
Alkaline Phosphatase: 159 U/L — ABNORMAL HIGH
BILIRUBIN TOTAL: 0.4 mg/dL (ref 0.2–1.0)
BUN: 9 mg/dL (ref 7–18)
Calcium, Total: 9.9 mg/dL (ref 8.5–10.1)
Chloride: 101 mmol/L (ref 98–107)
Co2: 25 mmol/L (ref 21–32)
Creatinine: 0.72 mg/dL (ref 0.60–1.30)
EGFR (African American): >60
EGFR (Non-African Amer.): >60
Glucose: 119 mg/dL — ABNORMAL HIGH (ref 65–99)
Osmolality: 272 (ref 275–301)
Potassium: 3.7 mmol/L (ref 3.5–5.1)
SGOT(AST): 56 U/L — ABNORMAL HIGH (ref 15–37)
SGPT (ALT): 57 U/L (ref 12–78)
SODIUM: 136 mmol/L (ref 136–145)
Total Protein: 8.6 g/dL — ABNORMAL HIGH (ref 6.4–8.2)

## 2013-06-07 LAB — PROTEIN, URINE, RANDOM: Protein, Random Urine: 45 mg/dL — ABNORMAL HIGH

## 2013-06-14 LAB — CBC CANCER CENTER
BASOS ABS: 0 x10 3/mm (ref 0.0–0.1)
Basophil %: 0.4 %
Eosinophil #: 0 x10 3/mm (ref 0.0–0.7)
Eosinophil %: 0.2 %
HCT: 43.8 % (ref 35.0–47.0)
HGB: 15 g/dL (ref 12.0–16.0)
LYMPHS ABS: 1.1 x10 3/mm (ref 1.0–3.6)
LYMPHS PCT: 12 %
MCH: 31.6 pg (ref 26.0–34.0)
MCHC: 34.2 g/dL (ref 32.0–36.0)
MCV: 93 fL (ref 80–100)
Monocyte #: 1.7 x10 3/mm — ABNORMAL HIGH (ref 0.2–0.9)
Monocyte %: 18.9 %
Neutrophil #: 6.3 x10 3/mm (ref 1.4–6.5)
Neutrophil %: 68.5 %
Platelet: 151 x10 3/mm (ref 150–440)
RBC: 4.74 10*6/uL (ref 3.80–5.20)
RDW: 14.6 % — AB (ref 11.5–14.5)
WBC: 9.2 x10 3/mm (ref 3.6–11.0)

## 2013-06-14 LAB — COMPREHENSIVE METABOLIC PANEL
ALBUMIN: 2.9 g/dL — AB (ref 3.4–5.0)
ANION GAP: 7 (ref 7–16)
Alkaline Phosphatase: 149 U/L — ABNORMAL HIGH
BUN: 11 mg/dL (ref 7–18)
Bilirubin,Total: 0.8 mg/dL (ref 0.2–1.0)
CALCIUM: 8.4 mg/dL — AB (ref 8.5–10.1)
CHLORIDE: 94 mmol/L — AB (ref 98–107)
CREATININE: 0.73 mg/dL (ref 0.60–1.30)
Co2: 28 mmol/L (ref 21–32)
EGFR (African American): >60
EGFR (Non-African Amer.): >60
GLUCOSE: 121 mg/dL — AB (ref 65–99)
Osmolality: 260 (ref 275–301)
POTASSIUM: 3.2 mmol/L — AB (ref 3.5–5.1)
SGOT(AST): 72 U/L — ABNORMAL HIGH (ref 15–37)
SGPT (ALT): 80 U/L — ABNORMAL HIGH (ref 12–78)
SODIUM: 129 mmol/L — AB (ref 136–145)
Total Protein: 8 g/dL (ref 6.4–8.2)

## 2013-06-18 ENCOUNTER — Ambulatory Visit: Payer: Self-pay | Admitting: Oncology

## 2013-06-21 LAB — CBC CANCER CENTER
BASOS PCT: 1.1 %
Basophil #: 0.1 x10 3/mm (ref 0.0–0.1)
EOS ABS: 0 x10 3/mm (ref 0.0–0.7)
EOS PCT: 0 %
HCT: 43.7 % (ref 35.0–47.0)
HGB: 14.7 g/dL (ref 12.0–16.0)
Lymphocyte #: 1.3 x10 3/mm (ref 1.0–3.6)
Lymphocyte %: 14.7 %
MCH: 31.4 pg (ref 26.0–34.0)
MCHC: 33.7 g/dL (ref 32.0–36.0)
MCV: 93 fL (ref 80–100)
MONO ABS: 1.2 x10 3/mm — AB (ref 0.2–0.9)
Monocyte %: 13.7 %
Neutrophil #: 6.2 x10 3/mm (ref 1.4–6.5)
Neutrophil %: 70.5 %
PLATELETS: 187 x10 3/mm (ref 150–440)
RBC: 4.69 10*6/uL (ref 3.80–5.20)
RDW: 15 % — ABNORMAL HIGH (ref 11.5–14.5)
WBC: 8.8 x10 3/mm (ref 3.6–11.0)

## 2013-06-28 LAB — CBC CANCER CENTER
Basophil #: 0.1 x10 3/mm (ref 0.0–0.1)
Basophil %: 1.1 %
Eosinophil #: 0 x10 3/mm (ref 0.0–0.7)
Eosinophil %: 0.3 %
HCT: 41.8 % (ref 35.0–47.0)
HGB: 14 g/dL (ref 12.0–16.0)
Lymphocyte #: 1.1 x10 3/mm (ref 1.0–3.6)
Lymphocyte %: 17.2 %
MCH: 31.2 pg (ref 26.0–34.0)
MCHC: 33.5 g/dL (ref 32.0–36.0)
MCV: 93 fL (ref 80–100)
Monocyte #: 0.5 x10 3/mm (ref 0.2–0.9)
Monocyte %: 8.7 %
Neutrophil #: 4.5 x10 3/mm (ref 1.4–6.5)
Neutrophil %: 72.7 %
PLATELETS: 310 x10 3/mm (ref 150–440)
RBC: 4.5 10*6/uL (ref 3.80–5.20)
RDW: 14.9 % — ABNORMAL HIGH (ref 11.5–14.5)
WBC: 6.2 x10 3/mm (ref 3.6–11.0)

## 2013-07-12 LAB — CBC CANCER CENTER
BASOS PCT: 1.1 %
Basophil #: 0.1 x10 3/mm (ref 0.0–0.1)
Eosinophil #: 0.1 x10 3/mm (ref 0.0–0.7)
Eosinophil %: 1.5 %
HCT: 41.5 % (ref 35.0–47.0)
HGB: 13.9 g/dL (ref 12.0–16.0)
Lymphocyte #: 1.1 x10 3/mm (ref 1.0–3.6)
Lymphocyte %: 17.4 %
MCH: 31.7 pg (ref 26.0–34.0)
MCHC: 33.4 g/dL (ref 32.0–36.0)
MCV: 95 fL (ref 80–100)
MONO ABS: 0.9 x10 3/mm (ref 0.2–0.9)
MONOS PCT: 13.9 %
NEUTROS ABS: 4.2 x10 3/mm (ref 1.4–6.5)
Neutrophil %: 66.1 %
Platelet: 281 x10 3/mm (ref 150–440)
RBC: 4.38 10*6/uL (ref 3.80–5.20)
RDW: 15.4 % — ABNORMAL HIGH (ref 11.5–14.5)
WBC: 6.3 x10 3/mm (ref 3.6–11.0)

## 2013-07-12 LAB — COMPREHENSIVE METABOLIC PANEL
ALBUMIN: 3.3 g/dL — AB (ref 3.4–5.0)
ALT: 59 U/L (ref 12–78)
ANION GAP: 12 (ref 7–16)
AST: 57 U/L — AB (ref 15–37)
Alkaline Phosphatase: 186 U/L — ABNORMAL HIGH
BUN: 11 mg/dL (ref 7–18)
Bilirubin,Total: 1.2 mg/dL — ABNORMAL HIGH (ref 0.2–1.0)
CALCIUM: 9 mg/dL (ref 8.5–10.1)
CHLORIDE: 99 mmol/L (ref 98–107)
CREATININE: 0.76 mg/dL (ref 0.60–1.30)
Co2: 24 mmol/L (ref 21–32)
EGFR (African American): >60
EGFR (Non-African Amer.): >60
Glucose: 137 mg/dL — ABNORMAL HIGH (ref 65–99)
Osmolality: 272 (ref 275–301)
Potassium: 3.2 mmol/L — ABNORMAL LOW (ref 3.5–5.1)
Sodium: 135 mmol/L — ABNORMAL LOW (ref 136–145)
TOTAL PROTEIN: 8.7 g/dL — AB (ref 6.4–8.2)

## 2013-07-12 LAB — PROTEIN, URINE, RANDOM: Protein, Random Urine: 39 mg/dL — ABNORMAL HIGH (ref 0–12)

## 2013-07-16 ENCOUNTER — Ambulatory Visit: Payer: Self-pay | Admitting: Oncology

## 2013-07-19 LAB — CBC CANCER CENTER
Bands: 24 %
Basophil: 1 %
EOS PCT: 1 %
HCT: 40.9 % (ref 35.0–47.0)
HGB: 13.8 g/dL (ref 12.0–16.0)
Lymphocytes: 12 %
MCH: 31.5 pg (ref 26.0–34.0)
MCHC: 33.6 g/dL (ref 32.0–36.0)
MCV: 94 fL (ref 80–100)
METAMYELOCYTE: 4 %
MYELOCYTE: 1 %
Monocytes: 26 %
Platelet: 169 x10 3/mm (ref 150–440)
RBC: 4.36 10*6/uL (ref 3.80–5.20)
RDW: 15 % — ABNORMAL HIGH (ref 11.5–14.5)
Segmented Neutrophils: 27 %
VARIANT LYMPHOCYTE - H4-RLYMPH: 4 %
WBC: 10.9 x10 3/mm (ref 3.6–11.0)

## 2013-07-26 LAB — CBC CANCER CENTER
BASOS ABS: 0.1 x10 3/mm (ref 0.0–0.1)
BASOS PCT: 0.4 %
Eosinophil #: 0 x10 3/mm (ref 0.0–0.7)
Eosinophil %: 0 %
HCT: 38.8 % (ref 35.0–47.0)
HGB: 13.3 g/dL (ref 12.0–16.0)
LYMPHS PCT: 7.1 %
Lymphocyte #: 0.9 x10 3/mm — ABNORMAL LOW (ref 1.0–3.6)
MCH: 31.4 pg (ref 26.0–34.0)
MCHC: 34.3 g/dL (ref 32.0–36.0)
MCV: 92 fL (ref 80–100)
MONOS PCT: 14.7 %
Monocyte #: 1.8 x10 3/mm — ABNORMAL HIGH (ref 0.2–0.9)
NEUTROS ABS: 9.6 x10 3/mm — AB (ref 1.4–6.5)
NEUTROS PCT: 77.8 %
Platelet: 232 x10 3/mm (ref 150–440)
RBC: 4.23 10*6/uL (ref 3.80–5.20)
RDW: 14.8 % — AB (ref 11.5–14.5)
WBC: 12.3 x10 3/mm — AB (ref 3.6–11.0)

## 2013-07-26 LAB — COMPREHENSIVE METABOLIC PANEL
ALBUMIN: 2.4 g/dL — AB (ref 3.4–5.0)
ALK PHOS: 125 U/L — AB
ALT: 41 U/L (ref 12–78)
ANION GAP: 11 (ref 7–16)
BILIRUBIN TOTAL: 0.9 mg/dL (ref 0.2–1.0)
BUN: 11 mg/dL (ref 7–18)
CALCIUM: 9.1 mg/dL (ref 8.5–10.1)
CHLORIDE: 92 mmol/L — AB (ref 98–107)
CO2: 25 mmol/L (ref 21–32)
Creatinine: 0.83 mg/dL (ref 0.60–1.30)
Glucose: 130 mg/dL — ABNORMAL HIGH (ref 65–99)
OSMOLALITY: 258 (ref 275–301)
Potassium: 2.8 mmol/L — ABNORMAL LOW (ref 3.5–5.1)
SGOT(AST): 72 U/L — ABNORMAL HIGH (ref 15–37)
Sodium: 128 mmol/L — ABNORMAL LOW (ref 136–145)
TOTAL PROTEIN: 8 g/dL (ref 6.4–8.2)

## 2013-07-26 LAB — MAGNESIUM: Magnesium: 2.4 mg/dL

## 2013-08-02 LAB — CBC CANCER CENTER
Basophil #: 0.1 x10 3/mm (ref 0.0–0.1)
Basophil %: 1 %
EOS ABS: 0 x10 3/mm (ref 0.0–0.7)
Eosinophil %: 0.2 %
HCT: 40.4 % (ref 35.0–47.0)
HGB: 13.8 g/dL (ref 12.0–16.0)
Lymphocyte #: 1.3 x10 3/mm (ref 1.0–3.6)
Lymphocyte %: 17.3 %
MCH: 32 pg (ref 26.0–34.0)
MCHC: 34.3 g/dL (ref 32.0–36.0)
MCV: 93 fL (ref 80–100)
Monocyte #: 1.7 x10 3/mm — ABNORMAL HIGH (ref 0.2–0.9)
Monocyte %: 21.4 %
NEUTROS PCT: 60.1 %
Neutrophil #: 4.7 x10 3/mm (ref 1.4–6.5)
Platelet: 462 x10 3/mm — ABNORMAL HIGH (ref 150–440)
RBC: 4.32 10*6/uL (ref 3.80–5.20)
RDW: 15.8 % — ABNORMAL HIGH (ref 11.5–14.5)
WBC: 7.8 x10 3/mm (ref 3.6–11.0)

## 2013-08-09 LAB — COMPREHENSIVE METABOLIC PANEL
ALK PHOS: 177 U/L — AB
AST: 89 U/L — AB (ref 15–37)
Albumin: 2.7 g/dL — ABNORMAL LOW (ref 3.4–5.0)
Anion Gap: 11 (ref 7–16)
BUN: 9 mg/dL (ref 7–18)
Bilirubin,Total: 0.4 mg/dL (ref 0.2–1.0)
CALCIUM: 9.2 mg/dL (ref 8.5–10.1)
CHLORIDE: 103 mmol/L (ref 98–107)
CREATININE: 0.75 mg/dL (ref 0.60–1.30)
Co2: 26 mmol/L (ref 21–32)
EGFR (African American): >60
EGFR (Non-African Amer.): >60
Glucose: 148 mg/dL — ABNORMAL HIGH (ref 65–99)
Osmolality: 281 (ref 275–301)
POTASSIUM: 3.5 mmol/L (ref 3.5–5.1)
SGPT (ALT): 53 U/L (ref 12–78)
SODIUM: 140 mmol/L (ref 136–145)
Total Protein: 8.2 g/dL (ref 6.4–8.2)

## 2013-08-09 LAB — CBC CANCER CENTER
Basophil #: 0 x10 3/mm (ref 0.0–0.1)
Basophil %: 0.9 %
Eosinophil #: 0.1 x10 3/mm (ref 0.0–0.7)
Eosinophil %: 1.8 %
HCT: 37.2 % (ref 35.0–47.0)
HGB: 12.4 g/dL (ref 12.0–16.0)
Lymphocyte #: 1.2 x10 3/mm (ref 1.0–3.6)
Lymphocyte %: 23 %
MCH: 31.2 pg (ref 26.0–34.0)
MCHC: 33.3 g/dL (ref 32.0–36.0)
MCV: 94 fL (ref 80–100)
MONOS PCT: 12.7 %
Monocyte #: 0.6 x10 3/mm (ref 0.2–0.9)
NEUTROS ABS: 3.1 x10 3/mm (ref 1.4–6.5)
Neutrophil %: 61.6 %
Platelet: 330 x10 3/mm (ref 150–440)
RBC: 3.96 10*6/uL (ref 3.80–5.20)
RDW: 15.7 % — AB (ref 11.5–14.5)
WBC: 5.1 x10 3/mm (ref 3.6–11.0)

## 2013-08-16 ENCOUNTER — Ambulatory Visit: Payer: Self-pay | Admitting: Oncology

## 2013-08-16 LAB — CBC CANCER CENTER
Basophil #: 0 x10 3/mm (ref 0.0–0.1)
Basophil %: 0.9 %
Eosinophil #: 0.1 x10 3/mm (ref 0.0–0.7)
Eosinophil %: 2.9 %
HCT: 38.8 % (ref 35.0–47.0)
HGB: 12.9 g/dL (ref 12.0–16.0)
Lymphocyte #: 1.1 x10 3/mm (ref 1.0–3.6)
Lymphocyte %: 25.8 %
MCH: 31.7 pg (ref 26.0–34.0)
MCHC: 33.2 g/dL (ref 32.0–36.0)
MCV: 95 fL (ref 80–100)
Monocyte #: 0.7 x10 3/mm (ref 0.2–0.9)
Monocyte %: 15 %
Neutrophil #: 2.4 x10 3/mm (ref 1.4–6.5)
Neutrophil %: 55.4 %
PLATELETS: 329 x10 3/mm (ref 150–440)
RBC: 4.06 10*6/uL (ref 3.80–5.20)
RDW: 16.5 % — AB (ref 11.5–14.5)
WBC: 4.4 x10 3/mm (ref 3.6–11.0)

## 2013-08-30 LAB — CBC CANCER CENTER
BASOS ABS: 0 x10 3/mm (ref 0.0–0.1)
Basophil %: 1.1 %
Eosinophil #: 0.1 x10 3/mm (ref 0.0–0.7)
Eosinophil %: 1.4 %
HCT: 43.1 % (ref 35.0–47.0)
HGB: 14 g/dL (ref 12.0–16.0)
Lymphocyte #: 1.1 x10 3/mm (ref 1.0–3.6)
Lymphocyte %: 27.1 %
MCH: 31.1 pg (ref 26.0–34.0)
MCHC: 32.5 g/dL (ref 32.0–36.0)
MCV: 96 fL (ref 80–100)
Monocyte #: 0.6 x10 3/mm (ref 0.2–0.9)
Monocyte %: 13.5 %
Neutrophil #: 2.4 x10 3/mm (ref 1.4–6.5)
Neutrophil %: 56.9 %
Platelet: 235 x10 3/mm (ref 150–440)
RBC: 4.5 10*6/uL (ref 3.80–5.20)
RDW: 16.5 % — ABNORMAL HIGH (ref 11.5–14.5)
WBC: 4.1 x10 3/mm (ref 3.6–11.0)

## 2013-08-30 LAB — COMPREHENSIVE METABOLIC PANEL
ALK PHOS: 182 U/L — AB
ANION GAP: 13 (ref 7–16)
Albumin: 3.4 g/dL (ref 3.4–5.0)
BILIRUBIN TOTAL: 0.4 mg/dL (ref 0.2–1.0)
BUN: 8 mg/dL (ref 7–18)
CREATININE: 0.77 mg/dL (ref 0.60–1.30)
Calcium, Total: 9.6 mg/dL (ref 8.5–10.1)
Chloride: 104 mmol/L (ref 98–107)
Co2: 25 mmol/L (ref 21–32)
EGFR (African American): >60
EGFR (Non-African Amer.): >60
Glucose: 136 mg/dL — ABNORMAL HIGH (ref 65–99)
Osmolality: 284 (ref 275–301)
Potassium: 3.3 mmol/L — ABNORMAL LOW (ref 3.5–5.1)
SGOT(AST): 92 U/L — ABNORMAL HIGH (ref 15–37)
SGPT (ALT): 76 U/L (ref 12–78)
Sodium: 142 mmol/L (ref 136–145)
Total Protein: 9.3 g/dL — ABNORMAL HIGH (ref 6.4–8.2)

## 2013-09-13 LAB — COMPREHENSIVE METABOLIC PANEL
ALK PHOS: 157 U/L — AB
ANION GAP: 9 (ref 7–16)
AST: 73 U/L — AB (ref 15–37)
Albumin: 3.1 g/dL — ABNORMAL LOW (ref 3.4–5.0)
BILIRUBIN TOTAL: 0.3 mg/dL (ref 0.2–1.0)
BUN: 17 mg/dL (ref 7–18)
CHLORIDE: 105 mmol/L (ref 98–107)
CO2: 26 mmol/L (ref 21–32)
Calcium, Total: 9.4 mg/dL (ref 8.5–10.1)
Creatinine: 1.05 mg/dL (ref 0.60–1.30)
EGFR (African American): >60
EGFR (Non-African Amer.): 59 — ABNORMAL LOW
Glucose: 103 mg/dL — ABNORMAL HIGH (ref 65–99)
Osmolality: 281 (ref 275–301)
POTASSIUM: 3.4 mmol/L — AB (ref 3.5–5.1)
SGPT (ALT): 85 U/L — ABNORMAL HIGH (ref 12–78)
Sodium: 140 mmol/L (ref 136–145)
TOTAL PROTEIN: 8.8 g/dL — AB (ref 6.4–8.2)

## 2013-09-13 LAB — CBC CANCER CENTER
BASOS PCT: 1.3 %
Basophil #: 0.1 x10 3/mm (ref 0.0–0.1)
EOS ABS: 0.1 x10 3/mm (ref 0.0–0.7)
Eosinophil %: 1.7 %
HCT: 41.2 % (ref 35.0–47.0)
HGB: 14.1 g/dL (ref 12.0–16.0)
Lymphocyte #: 1.2 x10 3/mm (ref 1.0–3.6)
Lymphocyte %: 27.3 %
MCH: 32.1 pg (ref 26.0–34.0)
MCHC: 34.2 g/dL (ref 32.0–36.0)
MCV: 94 fL (ref 80–100)
MONO ABS: 0.7 x10 3/mm (ref 0.2–0.9)
MONOS PCT: 15 %
Neutrophil #: 2.4 x10 3/mm (ref 1.4–6.5)
Neutrophil %: 54.7 %
PLATELETS: 241 x10 3/mm (ref 150–440)
RBC: 4.39 10*6/uL (ref 3.80–5.20)
RDW: 15.3 % — ABNORMAL HIGH (ref 11.5–14.5)
WBC: 4.5 x10 3/mm (ref 3.6–11.0)

## 2013-09-15 ENCOUNTER — Ambulatory Visit: Payer: Self-pay | Admitting: Oncology

## 2013-09-27 LAB — COMPREHENSIVE METABOLIC PANEL
ALBUMIN: 3.5 g/dL (ref 3.4–5.0)
ANION GAP: 11 (ref 7–16)
Alkaline Phosphatase: 197 U/L — ABNORMAL HIGH
BUN: 9 mg/dL (ref 7–18)
Bilirubin,Total: 0.7 mg/dL (ref 0.2–1.0)
CO2: 25 mmol/L (ref 21–32)
Calcium, Total: 9.5 mg/dL (ref 8.5–10.1)
Chloride: 103 mmol/L (ref 98–107)
Creatinine: 0.68 mg/dL (ref 0.60–1.30)
EGFR (Non-African Amer.): >60
Glucose: 102 mg/dL — ABNORMAL HIGH (ref 65–99)
OSMOLALITY: 276 (ref 275–301)
POTASSIUM: 3.9 mmol/L (ref 3.5–5.1)
SGOT(AST): 96 U/L — ABNORMAL HIGH (ref 15–37)
SGPT (ALT): 80 U/L — ABNORMAL HIGH (ref 12–78)
Sodium: 139 mmol/L (ref 136–145)
TOTAL PROTEIN: 9.7 g/dL — AB (ref 6.4–8.2)

## 2013-09-27 LAB — CBC CANCER CENTER
Basophil #: 0 x10 3/mm (ref 0.0–0.1)
Basophil %: 0.2 %
EOS PCT: 0.7 %
Eosinophil #: 0 x10 3/mm (ref 0.0–0.7)
HCT: 45.5 % (ref 35.0–47.0)
HGB: 15.5 g/dL (ref 12.0–16.0)
LYMPHS PCT: 32.8 %
Lymphocyte #: 1.3 x10 3/mm (ref 1.0–3.6)
MCH: 31.9 pg (ref 26.0–34.0)
MCHC: 34.1 g/dL (ref 32.0–36.0)
MCV: 94 fL (ref 80–100)
Monocyte #: 0.5 x10 3/mm (ref 0.2–0.9)
Monocyte %: 12.9 %
NEUTROS ABS: 2.1 x10 3/mm (ref 1.4–6.5)
Neutrophil %: 53.4 %
PLATELETS: 234 x10 3/mm (ref 150–440)
RBC: 4.86 10*6/uL (ref 3.80–5.20)
RDW: 15.1 % — ABNORMAL HIGH (ref 11.5–14.5)
WBC: 4 x10 3/mm (ref 3.6–11.0)

## 2013-10-11 LAB — CBC CANCER CENTER
Basophil #: 0.3 x10 3/mm — ABNORMAL HIGH (ref 0.0–0.1)
Basophil %: 6.9 %
EOS PCT: 3 %
Eosinophil #: 0.1 x10 3/mm (ref 0.0–0.7)
HCT: 43.9 % (ref 35.0–47.0)
HGB: 14.9 g/dL (ref 12.0–16.0)
LYMPHS ABS: 0.6 x10 3/mm — AB (ref 1.0–3.6)
Lymphocyte %: 14.6 %
MCH: 31.9 pg (ref 26.0–34.0)
MCHC: 34 g/dL (ref 32.0–36.0)
MCV: 94 fL (ref 80–100)
MONO ABS: 0.6 x10 3/mm (ref 0.2–0.9)
MONOS PCT: 16.4 %
NEUTROS PCT: 59.1 %
Neutrophil #: 2.3 x10 3/mm (ref 1.4–6.5)
Platelet: 243 x10 3/mm (ref 150–440)
RBC: 4.68 10*6/uL (ref 3.80–5.20)
RDW: 14.2 % (ref 11.5–14.5)
WBC: 3.8 x10 3/mm (ref 3.6–11.0)

## 2013-10-16 ENCOUNTER — Ambulatory Visit: Payer: Self-pay | Admitting: Oncology

## 2013-10-25 LAB — COMPREHENSIVE METABOLIC PANEL
ALBUMIN: 3.6 g/dL (ref 3.4–5.0)
ALK PHOS: 192 U/L — AB
ALT: 85 U/L — AB (ref 12–78)
ANION GAP: 9 (ref 7–16)
BILIRUBIN TOTAL: 0.8 mg/dL (ref 0.2–1.0)
BUN: 16 mg/dL (ref 7–18)
CALCIUM: 10 mg/dL (ref 8.5–10.1)
CHLORIDE: 100 mmol/L (ref 98–107)
CO2: 28 mmol/L (ref 21–32)
Creatinine: 0.87 mg/dL (ref 0.60–1.30)
EGFR (African American): >60
GLUCOSE: 105 mg/dL — AB (ref 65–99)
Osmolality: 275 (ref 275–301)
POTASSIUM: 3 mmol/L — AB (ref 3.5–5.1)
SGOT(AST): 87 U/L — ABNORMAL HIGH (ref 15–37)
Sodium: 137 mmol/L (ref 136–145)
Total Protein: 10.2 g/dL — ABNORMAL HIGH (ref 6.4–8.2)

## 2013-10-25 LAB — CBC CANCER CENTER
BASOS PCT: 1.2 %
Basophil #: 0 x10 3/mm (ref 0.0–0.1)
Eosinophil #: 0 x10 3/mm (ref 0.0–0.7)
Eosinophil %: 1.2 %
HCT: 47.7 % — ABNORMAL HIGH (ref 35.0–47.0)
HGB: 16.3 g/dL — AB (ref 12.0–16.0)
LYMPHS PCT: 31.4 %
Lymphocyte #: 1.2 x10 3/mm (ref 1.0–3.6)
MCH: 31.9 pg (ref 26.0–34.0)
MCHC: 34.2 g/dL (ref 32.0–36.0)
MCV: 93 fL (ref 80–100)
MONOS PCT: 14.7 %
Monocyte #: 0.5 x10 3/mm (ref 0.2–0.9)
Neutrophil #: 1.9 x10 3/mm (ref 1.4–6.5)
Neutrophil %: 51.5 %
PLATELETS: 239 x10 3/mm (ref 150–440)
RBC: 5.12 10*6/uL (ref 3.80–5.20)
RDW: 14.5 % (ref 11.5–14.5)
WBC: 3.7 x10 3/mm (ref 3.6–11.0)

## 2013-11-08 LAB — CBC CANCER CENTER
Basophil #: 0 x10 3/mm (ref 0.0–0.1)
Basophil %: 0.2 %
EOS ABS: 0.1 x10 3/mm (ref 0.0–0.7)
EOS PCT: 2.9 %
HCT: 45.8 % (ref 35.0–47.0)
HGB: 16 g/dL (ref 12.0–16.0)
LYMPHS ABS: 1.6 x10 3/mm (ref 1.0–3.6)
LYMPHS PCT: 44.8 %
MCH: 31.6 pg (ref 26.0–34.0)
MCHC: 35 g/dL (ref 32.0–36.0)
MCV: 90 fL (ref 80–100)
MONO ABS: 0.4 x10 3/mm (ref 0.2–0.9)
MONOS PCT: 11.5 %
NEUTROS PCT: 40.6 %
Neutrophil #: 1.4 x10 3/mm (ref 1.4–6.5)
Platelet: 221 x10 3/mm (ref 150–440)
RBC: 5.07 10*6/uL (ref 3.80–5.20)
RDW: 14.2 % (ref 11.5–14.5)
WBC: 3.5 x10 3/mm — AB (ref 3.6–11.0)

## 2013-11-08 LAB — COMPREHENSIVE METABOLIC PANEL
AST: 103 U/L — AB (ref 15–37)
Albumin: 3.3 g/dL — ABNORMAL LOW (ref 3.4–5.0)
Alkaline Phosphatase: 185 U/L — ABNORMAL HIGH
Anion Gap: 5 — ABNORMAL LOW (ref 7–16)
BUN: 7 mg/dL (ref 7–18)
Bilirubin,Total: 0.5 mg/dL (ref 0.2–1.0)
CHLORIDE: 103 mmol/L (ref 98–107)
CREATININE: 0.77 mg/dL (ref 0.60–1.30)
Calcium, Total: 9.6 mg/dL (ref 8.5–10.1)
Co2: 30 mmol/L (ref 21–32)
EGFR (African American): >60
Glucose: 99 mg/dL (ref 65–99)
Osmolality: 274 (ref 275–301)
POTASSIUM: 3 mmol/L — AB (ref 3.5–5.1)
SGPT (ALT): 73 U/L (ref 12–78)
Sodium: 138 mmol/L (ref 136–145)
TOTAL PROTEIN: 9.4 g/dL — AB (ref 6.4–8.2)

## 2013-11-15 ENCOUNTER — Ambulatory Visit: Payer: Self-pay | Admitting: Oncology

## 2013-11-22 LAB — COMPREHENSIVE METABOLIC PANEL
ALBUMIN: 3.4 g/dL (ref 3.4–5.0)
ALK PHOS: 197 U/L — AB
ANION GAP: 9 (ref 7–16)
AST: 122 U/L — AB (ref 15–37)
BILIRUBIN TOTAL: 0.7 mg/dL (ref 0.2–1.0)
BUN: 18 mg/dL (ref 7–18)
CALCIUM: 9.3 mg/dL (ref 8.5–10.1)
CHLORIDE: 101 mmol/L (ref 98–107)
CREATININE: 0.77 mg/dL (ref 0.60–1.30)
Co2: 29 mmol/L (ref 21–32)
EGFR (Non-African Amer.): >60
GLUCOSE: 100 mg/dL — AB (ref 65–99)
Osmolality: 280 (ref 275–301)
POTASSIUM: 3.4 mmol/L — AB (ref 3.5–5.1)
SGPT (ALT): 118 U/L — ABNORMAL HIGH (ref 12–78)
Sodium: 139 mmol/L (ref 136–145)
Total Protein: 9.7 g/dL — ABNORMAL HIGH (ref 6.4–8.2)

## 2013-11-22 LAB — CBC CANCER CENTER
Basophil #: 0.1 x10 3/mm (ref 0.0–0.1)
Basophil %: 1.3 %
Eosinophil #: 0.1 x10 3/mm (ref 0.0–0.7)
Eosinophil %: 1.4 %
HCT: 45.1 % (ref 35.0–47.0)
HGB: 15.3 g/dL (ref 12.0–16.0)
LYMPHS ABS: 1 x10 3/mm (ref 1.0–3.6)
Lymphocyte %: 25.3 %
MCH: 31.5 pg (ref 26.0–34.0)
MCHC: 34 g/dL (ref 32.0–36.0)
MCV: 93 fL (ref 80–100)
Monocyte #: 0.6 x10 3/mm (ref 0.2–0.9)
Monocyte %: 13.9 %
Neutrophil #: 2.3 x10 3/mm (ref 1.4–6.5)
Neutrophil %: 58.1 %
Platelet: 165 x10 3/mm (ref 150–440)
RBC: 4.87 10*6/uL (ref 3.80–5.20)
RDW: 14 % (ref 11.5–14.5)
WBC: 4 x10 3/mm (ref 3.6–11.0)

## 2013-12-06 LAB — CBC CANCER CENTER
Basophil #: 0 x10 3/mm (ref 0.0–0.1)
Basophil %: 1 %
EOS PCT: 3 %
Eosinophil #: 0.1 x10 3/mm (ref 0.0–0.7)
HCT: 46.5 % (ref 35.0–47.0)
HGB: 15.8 g/dL (ref 12.0–16.0)
LYMPHS ABS: 1.4 x10 3/mm (ref 1.0–3.6)
Lymphocyte %: 32.9 %
MCH: 31.2 pg (ref 26.0–34.0)
MCHC: 33.9 g/dL (ref 32.0–36.0)
MCV: 92 fL (ref 80–100)
MONOS PCT: 12.9 %
Monocyte #: 0.5 x10 3/mm (ref 0.2–0.9)
NEUTROS ABS: 2.1 x10 3/mm (ref 1.4–6.5)
Neutrophil %: 50.2 %
Platelet: 259 x10 3/mm (ref 150–440)
RBC: 5.06 10*6/uL (ref 3.80–5.20)
RDW: 14.4 % (ref 11.5–14.5)
WBC: 4.2 x10 3/mm (ref 3.6–11.0)

## 2013-12-16 ENCOUNTER — Ambulatory Visit: Payer: Self-pay | Admitting: Oncology

## 2013-12-20 LAB — CBC CANCER CENTER
Basophil #: 0 x10 3/mm (ref 0.0–0.1)
Basophil %: 0.2 %
Eosinophil #: 0.1 x10 3/mm (ref 0.0–0.7)
Eosinophil %: 2 %
HCT: 46.3 % (ref 35.0–47.0)
HGB: 15.7 g/dL (ref 12.0–16.0)
LYMPHS PCT: 27.5 %
Lymphocyte #: 1 x10 3/mm (ref 1.0–3.6)
MCH: 31.7 pg (ref 26.0–34.0)
MCHC: 33.8 g/dL (ref 32.0–36.0)
MCV: 94 fL (ref 80–100)
MONO ABS: 0.4 x10 3/mm (ref 0.2–0.9)
Monocyte %: 10.7 %
Neutrophil #: 2.1 x10 3/mm (ref 1.4–6.5)
Neutrophil %: 59.6 %
Platelet: 234 x10 3/mm (ref 150–440)
RBC: 4.94 10*6/uL (ref 3.80–5.20)
RDW: 15.5 % — AB (ref 11.5–14.5)
WBC: 3.5 x10 3/mm — AB (ref 3.6–11.0)

## 2013-12-20 LAB — COMPREHENSIVE METABOLIC PANEL
Albumin: 3.4 g/dL (ref 3.4–5.0)
Alkaline Phosphatase: 168 U/L — ABNORMAL HIGH
Anion Gap: 10 (ref 7–16)
BUN: 12 mg/dL (ref 7–18)
Bilirubin,Total: 0.7 mg/dL (ref 0.2–1.0)
CALCIUM: 9.6 mg/dL (ref 8.5–10.1)
Chloride: 101 mmol/L (ref 98–107)
Co2: 27 mmol/L (ref 21–32)
Creatinine: 0.96 mg/dL (ref 0.60–1.30)
EGFR (Non-African Amer.): >60
Glucose: 151 mg/dL — ABNORMAL HIGH (ref 65–99)
Osmolality: 278 (ref 275–301)
Potassium: 3.3 mmol/L — ABNORMAL LOW (ref 3.5–5.1)
SGOT(AST): 98 U/L — ABNORMAL HIGH (ref 15–37)
SGPT (ALT): 94 U/L — ABNORMAL HIGH
Sodium: 138 mmol/L (ref 136–145)
Total Protein: 9.4 g/dL — ABNORMAL HIGH (ref 6.4–8.2)

## 2014-01-03 LAB — CBC CANCER CENTER
BASOS ABS: 0.1 x10 3/mm (ref 0.0–0.1)
Basophil %: 1.4 %
EOS ABS: 0.1 x10 3/mm (ref 0.0–0.7)
Eosinophil %: 1.6 %
HCT: 44.5 % (ref 35.0–47.0)
HGB: 15 g/dL (ref 12.0–16.0)
LYMPHS PCT: 29.7 %
Lymphocyte #: 1.4 x10 3/mm (ref 1.0–3.6)
MCH: 31.8 pg (ref 26.0–34.0)
MCHC: 33.8 g/dL (ref 32.0–36.0)
MCV: 94 fL (ref 80–100)
Monocyte #: 0.7 x10 3/mm (ref 0.2–0.9)
Monocyte %: 15.6 %
NEUTROS ABS: 2.4 x10 3/mm (ref 1.4–6.5)
NEUTROS PCT: 51.7 %
PLATELETS: 230 x10 3/mm (ref 150–440)
RBC: 4.73 10*6/uL (ref 3.80–5.20)
RDW: 15.8 % — AB (ref 11.5–14.5)
WBC: 4.6 x10 3/mm (ref 3.6–11.0)

## 2014-01-03 LAB — BASIC METABOLIC PANEL
Anion Gap: 12 (ref 7–16)
BUN: 11 mg/dL (ref 7–18)
CREATININE: 0.82 mg/dL (ref 0.60–1.30)
Calcium, Total: 8.9 mg/dL (ref 8.5–10.1)
Chloride: 100 mmol/L (ref 98–107)
Co2: 25 mmol/L (ref 21–32)
EGFR (African American): >60
GLUCOSE: 83 mg/dL (ref 65–99)
Osmolality: 272 (ref 275–301)
POTASSIUM: 3.2 mmol/L — AB (ref 3.5–5.1)
Sodium: 137 mmol/L (ref 136–145)

## 2014-01-16 ENCOUNTER — Ambulatory Visit: Payer: Self-pay | Admitting: Oncology

## 2014-01-17 LAB — CBC CANCER CENTER
Basophil #: 0 x10 3/mm (ref 0.0–0.1)
Basophil %: 0.8 %
EOS PCT: 1.7 %
Eosinophil #: 0.1 x10 3/mm (ref 0.0–0.7)
HCT: 47.1 % — AB (ref 35.0–47.0)
HGB: 15.9 g/dL (ref 12.0–16.0)
LYMPHS PCT: 29.3 %
Lymphocyte #: 1.1 x10 3/mm (ref 1.0–3.6)
MCH: 31.9 pg (ref 26.0–34.0)
MCHC: 33.8 g/dL (ref 32.0–36.0)
MCV: 95 fL (ref 80–100)
MONO ABS: 0.6 x10 3/mm (ref 0.2–0.9)
Monocyte %: 14.7 %
NEUTROS ABS: 2 x10 3/mm (ref 1.4–6.5)
NEUTROS PCT: 53.5 %
Platelet: 220 x10 3/mm (ref 150–440)
RBC: 4.97 10*6/uL (ref 3.80–5.20)
RDW: 15.6 % — ABNORMAL HIGH (ref 11.5–14.5)
WBC: 3.7 x10 3/mm (ref 3.6–11.0)

## 2014-01-17 LAB — COMPREHENSIVE METABOLIC PANEL
ALT: 80 U/L — AB
Albumin: 3.6 g/dL (ref 3.4–5.0)
Alkaline Phosphatase: 153 U/L — ABNORMAL HIGH
Anion Gap: 11 (ref 7–16)
BUN: 11 mg/dL (ref 7–18)
Bilirubin,Total: 0.9 mg/dL (ref 0.2–1.0)
CALCIUM: 9.5 mg/dL (ref 8.5–10.1)
Chloride: 100 mmol/L (ref 98–107)
Co2: 26 mmol/L (ref 21–32)
Creatinine: 0.8 mg/dL (ref 0.60–1.30)
EGFR (Non-African Amer.): >60
Glucose: 105 mg/dL — ABNORMAL HIGH (ref 65–99)
Osmolality: 274 (ref 275–301)
Potassium: 3.9 mmol/L (ref 3.5–5.1)
SGOT(AST): 81 U/L — ABNORMAL HIGH (ref 15–37)
Sodium: 137 mmol/L (ref 136–145)
Total Protein: 9.6 g/dL — ABNORMAL HIGH (ref 6.4–8.2)

## 2014-01-31 LAB — CBC CANCER CENTER
Basophil #: 0 x10 3/mm (ref 0.0–0.1)
Basophil %: 1.4 %
EOS ABS: 0.1 x10 3/mm (ref 0.0–0.7)
Eosinophil %: 1.8 %
HCT: 43.9 % (ref 35.0–47.0)
HGB: 14.6 g/dL (ref 12.0–16.0)
Lymphocyte #: 0.9 x10 3/mm — ABNORMAL LOW (ref 1.0–3.6)
Lymphocyte %: 26.8 %
MCH: 32.1 pg (ref 26.0–34.0)
MCHC: 33.4 g/dL (ref 32.0–36.0)
MCV: 96 fL (ref 80–100)
MONOS PCT: 10.7 %
Monocyte #: 0.4 x10 3/mm (ref 0.2–0.9)
Neutrophil #: 2.1 x10 3/mm (ref 1.4–6.5)
Neutrophil %: 59.3 %
Platelet: 223 x10 3/mm (ref 150–440)
RBC: 4.57 10*6/uL (ref 3.80–5.20)
RDW: 15.3 % — ABNORMAL HIGH (ref 11.5–14.5)
WBC: 3.5 x10 3/mm — ABNORMAL LOW (ref 3.6–11.0)

## 2014-01-31 LAB — COMPREHENSIVE METABOLIC PANEL
ALK PHOS: 170 U/L — AB
ALT: 78 U/L — AB
ANION GAP: 10 (ref 7–16)
Albumin: 3.3 g/dL — ABNORMAL LOW (ref 3.4–5.0)
BILIRUBIN TOTAL: 0.4 mg/dL (ref 0.2–1.0)
BUN: 15 mg/dL (ref 7–18)
CO2: 26 mmol/L (ref 21–32)
Calcium, Total: 9.3 mg/dL (ref 8.5–10.1)
Chloride: 101 mmol/L (ref 98–107)
Creatinine: 0.79 mg/dL (ref 0.60–1.30)
EGFR (African American): >60
EGFR (Non-African Amer.): >60
Glucose: 173 mg/dL — ABNORMAL HIGH (ref 65–99)
OSMOLALITY: 279 (ref 275–301)
POTASSIUM: 3.3 mmol/L — AB (ref 3.5–5.1)
SGOT(AST): 98 U/L — ABNORMAL HIGH (ref 15–37)
Sodium: 137 mmol/L (ref 136–145)
TOTAL PROTEIN: 8.9 g/dL — AB (ref 6.4–8.2)

## 2014-01-31 LAB — TSH: Thyroid Stimulating Horm: 1.03 u[IU]/mL

## 2014-02-14 LAB — CBC CANCER CENTER
Basophil #: 0 x10 3/mm (ref 0.0–0.1)
Basophil %: 1.2 %
Eosinophil #: 0.1 x10 3/mm (ref 0.0–0.7)
Eosinophil %: 2.2 %
HCT: 45.3 % (ref 35.0–47.0)
HGB: 15 g/dL (ref 12.0–16.0)
Lymphocyte #: 1.1 x10 3/mm (ref 1.0–3.6)
Lymphocyte %: 29.1 %
MCH: 31.9 pg (ref 26.0–34.0)
MCHC: 33.1 g/dL (ref 32.0–36.0)
MCV: 96 fL (ref 80–100)
MONO ABS: 0.6 x10 3/mm (ref 0.2–0.9)
MONOS PCT: 15.6 %
NEUTROS ABS: 2 x10 3/mm (ref 1.4–6.5)
NEUTROS PCT: 51.9 %
Platelet: 204 x10 3/mm (ref 150–440)
RBC: 4.7 10*6/uL (ref 3.80–5.20)
RDW: 14.9 % — AB (ref 11.5–14.5)
WBC: 3.8 x10 3/mm (ref 3.6–11.0)

## 2014-02-14 LAB — COMPREHENSIVE METABOLIC PANEL
ALBUMIN: 3.7 g/dL (ref 3.4–5.0)
ALT: 114 U/L — AB
ANION GAP: 10 (ref 7–16)
Alkaline Phosphatase: 178 U/L — ABNORMAL HIGH
BILIRUBIN TOTAL: 0.7 mg/dL (ref 0.2–1.0)
BUN: 24 mg/dL — ABNORMAL HIGH (ref 7–18)
CREATININE: 0.91 mg/dL (ref 0.60–1.30)
Calcium, Total: 9.5 mg/dL (ref 8.5–10.1)
Chloride: 101 mmol/L (ref 98–107)
Co2: 25 mmol/L (ref 21–32)
EGFR (African American): >60
EGFR (Non-African Amer.): >60
Glucose: 178 mg/dL — ABNORMAL HIGH (ref 65–99)
OSMOLALITY: 280 (ref 275–301)
Potassium: 3.3 mmol/L — ABNORMAL LOW (ref 3.5–5.1)
SGOT(AST): 128 U/L — ABNORMAL HIGH (ref 15–37)
Sodium: 136 mmol/L (ref 136–145)
Total Protein: 9.2 g/dL — ABNORMAL HIGH (ref 6.4–8.2)

## 2014-02-15 ENCOUNTER — Ambulatory Visit: Payer: Self-pay | Admitting: Oncology

## 2014-02-28 LAB — COMPREHENSIVE METABOLIC PANEL
ANION GAP: 9 (ref 7–16)
Albumin: 3.7 g/dL (ref 3.4–5.0)
Alkaline Phosphatase: 157 U/L — ABNORMAL HIGH
BUN: 22 mg/dL — ABNORMAL HIGH (ref 7–18)
Bilirubin,Total: 0.7 mg/dL (ref 0.2–1.0)
CHLORIDE: 99 mmol/L (ref 98–107)
CO2: 28 mmol/L (ref 21–32)
Calcium, Total: 9.5 mg/dL (ref 8.5–10.1)
Creatinine: 0.91 mg/dL (ref 0.60–1.30)
EGFR (African American): >60
Glucose: 101 mg/dL — ABNORMAL HIGH (ref 65–99)
OSMOLALITY: 275 (ref 275–301)
POTASSIUM: 3.1 mmol/L — AB (ref 3.5–5.1)
SGOT(AST): 98 U/L — ABNORMAL HIGH (ref 15–37)
SGPT (ALT): 98 U/L — ABNORMAL HIGH
SODIUM: 136 mmol/L (ref 136–145)
Total Protein: 9.4 g/dL — ABNORMAL HIGH (ref 6.4–8.2)

## 2014-02-28 LAB — CBC CANCER CENTER
BASOS ABS: 0 x10 3/mm (ref 0.0–0.1)
BASOS PCT: 0.3 %
EOS ABS: 0.1 x10 3/mm (ref 0.0–0.7)
Eosinophil %: 3.1 %
HCT: 44.7 % (ref 35.0–47.0)
HGB: 15.2 g/dL (ref 12.0–16.0)
LYMPHS PCT: 30.4 %
Lymphocyte #: 1.2 x10 3/mm (ref 1.0–3.6)
MCH: 32.3 pg (ref 26.0–34.0)
MCHC: 34 g/dL (ref 32.0–36.0)
MCV: 95 fL (ref 80–100)
MONO ABS: 0.7 x10 3/mm (ref 0.2–0.9)
MONOS PCT: 17.2 %
NEUTROS PCT: 49 %
Neutrophil #: 1.9 x10 3/mm (ref 1.4–6.5)
Platelet: 240 x10 3/mm (ref 150–440)
RBC: 4.7 10*6/uL (ref 3.80–5.20)
RDW: 14.1 % (ref 11.5–14.5)
WBC: 3.9 x10 3/mm (ref 3.6–11.0)

## 2014-03-18 ENCOUNTER — Ambulatory Visit: Payer: Self-pay | Admitting: Oncology

## 2014-03-26 ENCOUNTER — Ambulatory Visit: Payer: Self-pay | Admitting: Oncology

## 2014-03-28 ENCOUNTER — Ambulatory Visit: Payer: Self-pay | Admitting: Oncology

## 2014-03-28 LAB — COMPREHENSIVE METABOLIC PANEL
ALT: 111 U/L — AB
ANION GAP: 8 (ref 7–16)
Albumin: 3.6 g/dL (ref 3.4–5.0)
Alkaline Phosphatase: 195 U/L — ABNORMAL HIGH
BUN: 15 mg/dL (ref 7–18)
Bilirubin,Total: 0.7 mg/dL (ref 0.2–1.0)
CHLORIDE: 101 mmol/L (ref 98–107)
CO2: 29 mmol/L (ref 21–32)
CREATININE: 0.8 mg/dL (ref 0.60–1.30)
Calcium, Total: 9.3 mg/dL (ref 8.5–10.1)
EGFR (Non-African Amer.): >60
GLUCOSE: 86 mg/dL (ref 65–99)
Osmolality: 276 (ref 275–301)
POTASSIUM: 3.1 mmol/L — AB (ref 3.5–5.1)
SGOT(AST): 118 U/L — ABNORMAL HIGH (ref 15–37)
Sodium: 138 mmol/L (ref 136–145)
Total Protein: 9.8 g/dL — ABNORMAL HIGH (ref 6.4–8.2)

## 2014-03-28 LAB — CBC CANCER CENTER
Basophil #: 0 x10 3/mm (ref 0.0–0.1)
Basophil %: 1 %
EOS ABS: 0.1 x10 3/mm (ref 0.0–0.7)
EOS PCT: 1.9 %
HCT: 47.7 % — AB (ref 35.0–47.0)
HGB: 16.5 g/dL — AB (ref 12.0–16.0)
Lymphocyte #: 1.1 x10 3/mm (ref 1.0–3.6)
Lymphocyte %: 20.9 %
MCH: 33.3 pg (ref 26.0–34.0)
MCHC: 34.6 g/dL (ref 32.0–36.0)
MCV: 96 fL (ref 80–100)
MONO ABS: 0.8 x10 3/mm (ref 0.2–0.9)
Monocyte %: 16.1 %
NEUTROS ABS: 3 x10 3/mm (ref 1.4–6.5)
NEUTROS PCT: 60.1 %
Platelet: 234 x10 3/mm (ref 150–440)
RBC: 4.96 10*6/uL (ref 3.80–5.20)
RDW: 14.3 % (ref 11.5–14.5)
WBC: 5 x10 3/mm (ref 3.6–11.0)

## 2014-04-11 LAB — COMPREHENSIVE METABOLIC PANEL
ALK PHOS: 167 U/L — AB
ALT: 101 U/L — AB
AST: 113 U/L — AB (ref 15–37)
Albumin: 3.5 g/dL (ref 3.4–5.0)
Anion Gap: 9 (ref 7–16)
BUN: 9 mg/dL (ref 7–18)
Bilirubin,Total: 0.4 mg/dL (ref 0.2–1.0)
CALCIUM: 8.9 mg/dL (ref 8.5–10.1)
CREATININE: 0.62 mg/dL (ref 0.60–1.30)
Chloride: 105 mmol/L (ref 98–107)
Co2: 26 mmol/L (ref 21–32)
EGFR (African American): >60
EGFR (Non-African Amer.): >60
GLUCOSE: 107 mg/dL — AB (ref 65–99)
Osmolality: 279 (ref 275–301)
Potassium: 3.3 mmol/L — ABNORMAL LOW (ref 3.5–5.1)
Sodium: 140 mmol/L (ref 136–145)
TOTAL PROTEIN: 9 g/dL — AB (ref 6.4–8.2)

## 2014-04-11 LAB — CBC CANCER CENTER
Basophil #: 0.1 x10 3/mm (ref 0.0–0.1)
Basophil %: 2.2 %
EOS PCT: 4 %
Eosinophil #: 0.2 x10 3/mm (ref 0.0–0.7)
HCT: 43.4 % (ref 35.0–47.0)
HGB: 14.8 g/dL (ref 12.0–16.0)
LYMPHS ABS: 1.4 x10 3/mm (ref 1.0–3.6)
LYMPHS PCT: 32.1 %
MCH: 32.5 pg (ref 26.0–34.0)
MCHC: 34 g/dL (ref 32.0–36.0)
MCV: 96 fL (ref 80–100)
MONO ABS: 0.5 x10 3/mm (ref 0.2–0.9)
Monocyte %: 12.3 %
Neutrophil #: 2.1 x10 3/mm (ref 1.4–6.5)
Neutrophil %: 49.4 %
Platelet: 236 x10 3/mm (ref 150–440)
RBC: 4.54 10*6/uL (ref 3.80–5.20)
RDW: 14.4 % (ref 11.5–14.5)
WBC: 4.3 x10 3/mm (ref 3.6–11.0)

## 2014-04-17 ENCOUNTER — Ambulatory Visit: Payer: Self-pay | Admitting: Oncology

## 2014-04-25 LAB — COMPREHENSIVE METABOLIC PANEL
ALT: 127 U/L — AB
AST: 143 U/L — AB (ref 15–37)
Albumin: 3.9 g/dL (ref 3.4–5.0)
Alkaline Phosphatase: 198 U/L — ABNORMAL HIGH
Anion Gap: 10 (ref 7–16)
BILIRUBIN TOTAL: 1.3 mg/dL — AB (ref 0.2–1.0)
BUN: 21 mg/dL — AB (ref 7–18)
CALCIUM: 9.3 mg/dL (ref 8.5–10.1)
CHLORIDE: 96 mmol/L — AB (ref 98–107)
Co2: 26 mmol/L (ref 21–32)
Creatinine: 1.18 mg/dL (ref 0.60–1.30)
EGFR (African American): >60
EGFR (Non-African Amer.): 50 — ABNORMAL LOW
Glucose: 97 mg/dL (ref 65–99)
Osmolality: 267 (ref 275–301)
Potassium: 4.1 mmol/L (ref 3.5–5.1)
Sodium: 132 mmol/L — ABNORMAL LOW (ref 136–145)
Total Protein: 10.1 g/dL — ABNORMAL HIGH (ref 6.4–8.2)

## 2014-04-25 LAB — CBC CANCER CENTER
BASOS ABS: 0 x10 3/mm (ref 0.0–0.1)
Basophil %: 0.3 %
EOS ABS: 0.1 x10 3/mm (ref 0.0–0.7)
EOS PCT: 1.4 %
HCT: 45.4 % (ref 35.0–47.0)
HGB: 15.4 g/dL (ref 12.0–16.0)
LYMPHS PCT: 25 %
Lymphocyte #: 1 x10 3/mm (ref 1.0–3.6)
MCH: 32 pg (ref 26.0–34.0)
MCHC: 34 g/dL (ref 32.0–36.0)
MCV: 94 fL (ref 80–100)
MONO ABS: 0.8 x10 3/mm (ref 0.2–0.9)
Monocyte %: 19.1 %
Neutrophil #: 2.2 x10 3/mm (ref 1.4–6.5)
Neutrophil %: 54.2 %
PLATELETS: 234 x10 3/mm (ref 150–440)
RBC: 4.82 10*6/uL (ref 3.80–5.20)
RDW: 14.2 % (ref 11.5–14.5)
WBC: 4 x10 3/mm (ref 3.6–11.0)

## 2014-05-02 LAB — CBC CANCER CENTER
BASOS ABS: 0 x10 3/mm (ref 0.0–0.1)
BASOS PCT: 1.2 %
EOS PCT: 2.3 %
Eosinophil #: 0.1 x10 3/mm (ref 0.0–0.7)
HCT: 42 % (ref 35.0–47.0)
HGB: 14 g/dL (ref 12.0–16.0)
LYMPHS PCT: 29.6 %
Lymphocyte #: 1.1 x10 3/mm (ref 1.0–3.6)
MCH: 32.5 pg (ref 26.0–34.0)
MCHC: 33.4 g/dL (ref 32.0–36.0)
MCV: 97 fL (ref 80–100)
MONO ABS: 0.5 x10 3/mm (ref 0.2–0.9)
MONOS PCT: 13.6 %
NEUTROS PCT: 53.3 %
Neutrophil #: 2 x10 3/mm (ref 1.4–6.5)
Platelet: 220 x10 3/mm (ref 150–440)
RBC: 4.32 10*6/uL (ref 3.80–5.20)
RDW: 14.2 % (ref 11.5–14.5)
WBC: 3.8 x10 3/mm (ref 3.6–11.0)

## 2014-05-02 LAB — COMPREHENSIVE METABOLIC PANEL
ALBUMIN: 3.4 g/dL (ref 3.4–5.0)
Alkaline Phosphatase: 194 U/L — ABNORMAL HIGH
Anion Gap: 7 (ref 7–16)
BUN: 8 mg/dL (ref 7–18)
Bilirubin,Total: 0.4 mg/dL (ref 0.2–1.0)
Calcium, Total: 8.9 mg/dL (ref 8.5–10.1)
Chloride: 107 mmol/L (ref 98–107)
Co2: 25 mmol/L (ref 21–32)
Creatinine: 0.65 mg/dL (ref 0.60–1.30)
EGFR (African American): >60
EGFR (Non-African Amer.): >60
GLUCOSE: 131 mg/dL — AB (ref 65–99)
Osmolality: 278 (ref 275–301)
Potassium: 3.2 mmol/L — ABNORMAL LOW (ref 3.5–5.1)
SGOT(AST): 159 U/L — ABNORMAL HIGH (ref 15–37)
SGPT (ALT): 126 U/L — ABNORMAL HIGH
Sodium: 139 mmol/L (ref 136–145)
TOTAL PROTEIN: 9.1 g/dL — AB (ref 6.4–8.2)

## 2014-05-18 ENCOUNTER — Ambulatory Visit: Payer: Self-pay | Admitting: Oncology

## 2014-05-29 ENCOUNTER — Ambulatory Visit: Payer: Self-pay | Admitting: Oncology

## 2014-05-30 LAB — COMPREHENSIVE METABOLIC PANEL
ALK PHOS: 211 U/L — AB
ALT: 147 U/L — AB
ANION GAP: 11 (ref 7–16)
Albumin: 3.6 g/dL (ref 3.4–5.0)
BUN: 9 mg/dL (ref 7–18)
Bilirubin,Total: 0.4 mg/dL (ref 0.2–1.0)
CREATININE: 0.71 mg/dL (ref 0.60–1.30)
Calcium, Total: 8.8 mg/dL (ref 8.5–10.1)
Chloride: 102 mmol/L (ref 98–107)
Co2: 26 mmol/L (ref 21–32)
EGFR (African American): >60
GLUCOSE: 118 mg/dL — AB (ref 65–99)
OSMOLALITY: 277 (ref 275–301)
Potassium: 3.1 mmol/L — ABNORMAL LOW (ref 3.5–5.1)
SGOT(AST): 215 U/L — ABNORMAL HIGH (ref 15–37)
Sodium: 139 mmol/L (ref 136–145)
Total Protein: 9.5 g/dL — ABNORMAL HIGH (ref 6.4–8.2)

## 2014-05-30 LAB — CBC CANCER CENTER
BASOS ABS: 0 x10 3/mm (ref 0.0–0.1)
Basophil %: 0.3 %
EOS ABS: 0.1 x10 3/mm (ref 0.0–0.7)
Eosinophil %: 3.1 %
HCT: 44.7 % (ref 35.0–47.0)
HGB: 15.1 g/dL (ref 12.0–16.0)
LYMPHS PCT: 38.8 %
Lymphocyte #: 1.3 x10 3/mm (ref 1.0–3.6)
MCH: 32.4 pg (ref 26.0–34.0)
MCHC: 33.9 g/dL (ref 32.0–36.0)
MCV: 96 fL (ref 80–100)
MONO ABS: 0.4 x10 3/mm (ref 0.2–0.9)
Monocyte %: 10.9 %
NEUTROS PCT: 46.9 %
Neutrophil #: 1.6 x10 3/mm (ref 1.4–6.5)
PLATELETS: 231 x10 3/mm (ref 150–440)
RBC: 4.67 10*6/uL (ref 3.80–5.20)
RDW: 14.6 % — AB (ref 11.5–14.5)
WBC: 3.4 x10 3/mm — ABNORMAL LOW (ref 3.6–11.0)

## 2014-06-18 ENCOUNTER — Ambulatory Visit: Payer: Self-pay | Admitting: Oncology

## 2014-08-29 ENCOUNTER — Ambulatory Visit
Admit: 2014-08-29 | Disposition: A | Discharge status: Home / Self Care | Payer: Self-pay | Attending: Oncology | Admitting: Oncology

## 2014-08-29 LAB — COMPREHENSIVE METABOLIC PANEL
ALK PHOS: 168 U/L — AB
Albumin: 3.6 g/dL
Anion Gap: 6 — ABNORMAL LOW (ref 7–16)
BUN: 8 mg/dL
Bilirubin,Total: 0.6 mg/dL
CHLORIDE: 105 mmol/L
CREATININE: 0.64 mg/dL
Calcium, Total: 9 mg/dL
Co2: 24 mmol/L
EGFR (African American): >60
GLUCOSE: 98 mg/dL
POTASSIUM: 3.7 mmol/L
SGOT(AST): 111 U/L — ABNORMAL HIGH
SGPT (ALT): 78 U/L — ABNORMAL HIGH
Sodium: 135 mmol/L
TOTAL PROTEIN: 9.3 g/dL — AB

## 2014-08-29 LAB — CBC CANCER CENTER
BASOS PCT: 1.4 %
Basophil #: 0.1 x10 3/mm (ref 0.0–0.1)
EOS PCT: 1.2 %
Eosinophil #: 0.1 x10 3/mm (ref 0.0–0.7)
HCT: 41.9 % (ref 35.0–47.0)
HGB: 14.2 g/dL (ref 12.0–16.0)
LYMPHS PCT: 26.9 %
Lymphocyte #: 1.3 x10 3/mm (ref 1.0–3.6)
MCH: 31.7 pg (ref 26.0–34.0)
MCHC: 33.8 g/dL (ref 32.0–36.0)
MCV: 94 fL (ref 80–100)
MONOS PCT: 14.5 %
Monocyte #: 0.7 x10 3/mm (ref 0.2–0.9)
NEUTROS PCT: 56 %
Neutrophil #: 2.6 x10 3/mm (ref 1.4–6.5)
PLATELETS: 257 x10 3/mm (ref 150–440)
RBC: 4.48 10*6/uL (ref 3.80–5.20)
RDW: 13.9 % (ref 11.5–14.5)
WBC: 4.7 x10 3/mm (ref 3.6–11.0)

## 2014-08-29 LAB — CREATININE, SERUM: Creatine, Serum: 0.64

## 2014-09-05 ENCOUNTER — Other Ambulatory Visit: Payer: Self-pay | Admitting: Oncology

## 2014-09-05 DIAGNOSIS — C3490 Malignant neoplasm of unspecified part of unspecified bronchus or lung: Secondary | ICD-10-CM

## 2014-09-08 NOTE — Consult Note (Signed)
History of Present Illness:  Reason for Consult Chief Complaint/Diagnosis:  1. 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 Faith Rogue, cardiothoracic surgeon) 2. Started on radiation and concurrent chemotherapy with carboplatinum Taxol from April of 2013 3. Finished radiation and chemotherapy with carboplatinum and Taxol in May of 2013 4. Stage IIIB (T4N3M0) NSCLC who completed a course of EBRT on Oct 07, 2011 with a total dose of 7000cGy.   5. Starting consolidation with 2 cycles of carboplatinum and Abraxane 6.patient is off chemotherapy since November of 2013.  Repeat CT scan shows stable disease(January, 2014)   HPI   Patient with previous history of non-small cell carcinoma of lung started having cough yellowish expectoration and hemoptysis.  Low-grade fever.  Patient was admitted in the hospital had a CT scan which did not reveal any evidence of pulmonary embolism.  I was asked to evaluate patient because of abnormal CT scan.starting antibiotics patient condition is somewhat improved  PFSH:  Comments No family history of colorectal cancer, breast cancer, or ovarian cancer.   Comments Patient is a chronic smoker one pack per day for more than 40 years.  Also drinks beer.  Does not use any illegal decrease in her drug   Additional Past Medical and Surgical History No significant past medical history except for mentioned above.  History of hypertension.  COPD.   Review of Systems:  General fever  weakness  fatigue  Low-grade fever   Performance Status (ECOG) 1   HEENT as per HPI   Lungs as per HPI   Cardiac no complaints   GI as per HPI   GU no complaints   Musculoskeletal joint pain   Extremities no complaints   Skin no complaints   Neuro no complaints   Psych anxiety   Review of Systems   neck and shoulder pain  NURSING NOTES: **Vital Signs.:   06-Jan-15 05:25   Vital  Signs Type: Routine   Temperature Temperature (F): 97.6   Celsius: 36.4   Temperature Source: oral   Pulse Pulse: 95   Respirations Respirations: 20   Systolic BP Systolic BP: 737   Diastolic BP (mmHg) Diastolic BP (mmHg): 74   Mean BP: 85   Pulse Ox % Pulse Ox %: 92   Pulse Ox Activity Level: At rest   Oxygen Delivery: Room Air/ 21 %   Physical Exam:  General Patient is alert oriented not in any acute distress   HEENT: normal   Lungs: rales  crepitations  rhonchi   Cardiac: regular rate, rhythm   Abdomen: soft  nontender  positive bowel sounds   Skin: intact   Extremities: No edema, rash or cyanosis   Psych: normal appearance     Hepatitis C:    Back Pain, Chronic:    Swallowing Difficulty:    Gastric Reflux:    Heart Murmur:    Lung Cancer:    HTN:    Depression:    copd:    Lung Biopsy:    Tubal Ligation:    Hemorrhoidectomy:    No Known Allergies:     oxyCODONE 5 mg oral capsule: 1 cap(s) orally every 6 hours, Status: Active, Quantity: 40, Refills: None   Advair Diskus 250 mcg-50 mcg inhalation powder: 1 puff(s) inhaled 2 times a day, As Needed, Status: Active, Quantity: 1, Refills: None   Combivent inhalation aerosol with adapter: 2 puff(s) inhaled every 4 hours, Status: Active, Quantity:  1, Refills: None   hydrochlorothiazide 12.5 mg oral tablet: 1 tab(s) orally once a day, Status: Active, Quantity: 0, Refills: None  Laboratory Results: Routine Micro:  06-Jan-15 02:15   Micro Text Report INFLUENZA A+B ANTIGENS   COMMENT                   NEGATIVE FOR INFLUENZA A (ANTIGEN ABSENT)   COMMENT                   NEGATIVE FOR INFLUENZA B (ANTIGEN ABSENT)   ANTIBIOTIC                       Comment 1.. NEGATIVE FOR INFLUENZA A (ANTIGEN ABSENT) A negative result does not exclude influenza. Correlation with clinical impression is required.  Comment 2.. NEGATIVE FOR INFLUENZA B (ANTIGEN ABSENT)  Result(s) reported on 23 May 2013 at  02:54AM.  Routine Chem:  06-Jan-15 09:43   Magnesium, Serum 2.2 (1.8-2.4 THERAPEUTIC RANGE: 4-7 mg/dL TOXIC: > 10 mg/dL  -----------------------)  Glucose, Serum  199  BUN 17  Creatinine (comp) 0.78  Sodium, Serum  131  Potassium, Serum  3.4  Chloride, Serum 101  CO2, Serum 24  Calcium (Total), Serum 9.6  Anion Gap  6  Osmolality (calc) 270  eGFR (African American) >60  eGFR (Non-African American) >60 (eGFR values <52m/min/1.73 m2 may be an indication of chronic kidney disease (CKD). Calculated eGFR is useful in patients with stable renal function. The eGFR calculation will not be reliable in acutely ill patients when serum creatinine is changing rapidly. It is not useful in  patients on dialysis. 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  scan has been reviewed independently.  There is a right upper lobe consultation.  Possibility of progressive cancer cannot be ruled outcertainly has acute pulmonary infection superimposed on most likely progressive malignancy. with antibiotics and steroid and inhaler treatment. scan ordered for tomorrowon the PET scan result we decide whether patient needed bronchoscopy or needle biopsy  Plan:   ..........  Electronic Signatures: CJobe Gibbon(MD)  (Signed 06-Jan-15 13:02)  Authored: HISTORY OF PRESENT ILLNESS, PFSH, ROS, NURSING NOTES, PE, PAST MEDICAL HISTORY, ALLERGIES, HOME MEDICATIONS, LABS, OTHER RESULTS, ASSESSMENT AND PLAN   Last Updated: 06-Jan-15 13:02 by CJobe Gibbon(MD)

## 2014-09-08 NOTE — H&P (Signed)
PATIENT NAME:  Jeanette Jones, Jeanette Jones MR#:  [id number] DATE OF BIRTH:  1955-12-17  DATE OF ADMISSION:  05/23/2013  REFERRING PHYSICIAN: Dr. Delman Kitten.   PRIMARY CARE PHYSICIAN: The patient has been seen at [facility] by Dr. Torrie Mayers.    CHIEF COMPLAINT: Cough, shortness of breath, blood-tinged sputum.   HISTORY OF PRESENT ILLNESS: This is a 59 year old female with significant past medical history of lung cancer stage IIIA. Cancer is unresectable, status post chemo and radiation, following with Dr. Oliva Bustard. History of COPD, hepatitis C, hypertension, active tobacco use. Presents with complaint of cough, blood-tinged sputum, shortness of breath for the last week. As well, she reports she is having sore throat with sinus drainage as well. In the ED, the patient was found to have low-grade temperature at 99.1. She was significantly tachycardic, around 115 per minute on presentation. Given the fact she has hemoptysis and history of lung cancer, the patient had CT chest with IV contrast which came back negative for PE, but it did show right lung pneumonia with suspicion for a mass. As well, the patient's urinalysis was negative. The patient received IV fluids, but despite that, she remained tachycardic, so hospitalists were requested to admit the patient.   PAST MEDICAL HISTORY:  1. Hepatitis C.  2. Back pain, chronic.  3. Gastric reflux.  4. Heart murmur.  5. Lung cancer.  6. Hypertension.  7. Depression.  8. COPD.   9. Tubal ligation.  10. Hemorrhoidectomy.   11. Lung biopsy.   ALLERGIES: No known drug allergies.   SOCIAL HISTORY: The patient still smokes 1 pack per day for more than 30 years. As well, she drinks alcohol. The patient used to drink heavily in the past. She used to drink 240 ounces every day, but she says last drink was before 2 weeks, nothing since then. Denies any withdrawal symptoms. No illicit drug use.   FAMILY HISTORY: Significant for cancer.   HOME MEDICATIONS:   1. Advair 250/50 one puff b.i.d.  2. Oxycodone as needed for pain.  3. Hydrochlorothiazide 12.5 mg oral daily.  4. Combivent as needed.   REVIEW OF SYSTEMS:  CONSTITUTIONAL: The patient reports fever, chills, fatigue, weakness. Denies weight gain, weight loss.  EYES: Denies blurry vision, double vision, inflammation, glaucoma.  ENT: Denies tinnitus, ear pain, hearing loss. Complains of nasal discharge and sore throat.  RESPIRATORY: Denies any wheezing but reports cough, blood-tinged sputum, dyspnea.  CARDIOVASCULAR: Denies chest pain, edema, arrhythmia, palpitation, syncope.  GASTROINTESTINAL: Denies nausea, vomiting, diarrhea, abdominal pain, hematemesis.  GENITOURINARY: Denies renal colic. Reports dysuria and polyuria.  ENDOCRINE: Denies  polydipsia, heat or cold intolerance.  HEMATOLOGY: Denies anemia, easy bruising, bleeding diathesis.  INTEGUMENT: Denies acne, rash or skin lesions.  MUSCULOSKELETAL: Denies any swelling, gout or arthritis.  NEUROLOGIC: Denies CVA, TIA, tingling, numbness, dysarthria.  PSYCHIATRIC: Denies anxiety, insomnia, bipolar disorder.   PHYSICAL EXAMINATION:  VITAL SIGNS: Temperature 99, T-max 99.2, pulse 108, respiratory rate 26, blood pressure 117/77, saturating 96% on room air.  GENERAL: Well-nourished female, looks comfortable in bed, in no apparent distress.  HEENT: Head atraumatic, normocephalic. Pupils equal, reactive to light. Pink conjunctivae. Anicteric sclerae. Dry oral mucosa with no pharyngeal erythema.  NECK: Supple. No thyromegaly. No JVD. No carotid bruits.  CHEST: Good air entry bilaterally. No wheezing. Has right lung rales and rhonchi.  CARDIOVASCULAR: S1, S2 heard. Tachycardic but regular. Has mild murmur. No rubs. No gallops.  ABDOMEN: Soft, nontender, nondistended. Bowel sounds present.  EXTREMITIES: No edema.  No clubbing. No cyanosis. Pedal pulses +2 bilaterally.  PSYCHIATRIC: Appropriate affect. Intact judgment and insight.   NEUROLOGIC: Awake, alert, oriented x 4. Motor 5 out of 5. No focal deficits.  SKIN: Dry skin.  MUSCULOSKELETAL: No joint effusion or erythema.   PERTINENT LABORATORIES: Glucose 116, BUN 18, creatinine 0.84, sodium 130, potassium 3, chloride 98, CO2 28. Troponin less than 0.02. White blood cells 10.7, hemoglobin 14.7, hematocrit 42.6, platelets 221. Urinalysis +3 leukocyte esterase, 161 white blood cells.   IMAGING: CT angiogram negative for PE, showing right lung opacity. There is progression of consolidation in the anterior segment of the right upper lobe.    ASSESSMENT AND PLAN:  1. Pneumonia: The patient's blood cultures were sent. She will be started on intravenous levofloxacin.  2. Urinary tract infection: The patient was started on levofloxacin. Will follow on the urine culture.  3. Lung cancer: CT chest showing progression of the mass. Will consult oncology.  4. Hypokalemia: Will replace.  5. Tobacco abuse: The patient was counseled for 3 to 5 minutes. She will be started on nicotine patch.  6. Chronic obstructive pulmonary disease: The patient has no active wheezing. She will be continued on Advair and nebulizer treatments.  7. Hypertension: Blood pressure is on the lower side. Will hold her meds.  8. Deep vein thrombosis prophylaxis: Given the fact she is having hemoptysis, will not do any chemical anticoagulation. Will just keep her on thromboembolic deterrent hose and sequential compression device.  9. Code status was discussed with the patient. Reports she does not have a Living Will but reports she is DO NOT RESUSCITATE.   TOTAL TIME SPENT ON ADMISSION AND PATIENT CARE: 55 minutes.   ____________________________ Albertine Patricia, MD dse:gb D: 05/23/2013 00:52:37 ET T: 05/23/2013 01:34:31 ET JOB#: [id number]  cc: Albertine Patricia, MD, <Dictator> Savannah Morford Graciela Husbands MD ELECTRONICALLY SIGNED 05/25/2013 14:28

## 2014-09-08 NOTE — Discharge Summary (Signed)
PATIENT NAME:  Jeanette Jones, Jeanette Jones MR#:  [id number] DATE OF BIRTH:  Aug 22, 1955  DATE OF ADMISSION:  05/23/2013 DATE OF DISCHARGE:  05/25/2013  For a detailed note, please take a look at the history and physical done on admission by Dr. Waldron Labs.   DIAGNOSES AT DISCHARGE: Are as follows: Pneumonia, likely postobstructive pneumonia. Lung cancer. Hypertension. Chronic obstructive pulmonary disease.   The patient is being discharged home on a regular diet. Activities as tolerated. Follow up with  Dr. Oliva Bustard in the next 1 to 2 weeks.   DISCHARGE MEDICATIONS:  Combivent 2 puffs q.i.d., Advair 250/50, 1 puff b.i.d.; hydrochlorothiazide 12.5 mg daily, oxycodone 5 mg q.6 hours as needed, Levaquin 750 mg daily x 5 days, prednisone taper starting at 40 mg down to 10 mg over the next 4 days.   Consultants during the hospital course are Dr. Oliva Bustard from hematology/oncology.   PERTINENT STUDIES DONE DURING THE HOSPITAL COURSE: Are as follows: A CT scan of the chest done with contrast on January 5th showing areas of consolidation in the right upper and right lower lobe, suspect mass superimposed with consolidation in the right upper lobe. There is subcarinal and right peritracheal adenopathy, underlying emphysema and bronchiectasis. 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 These findings have progressed since August 2014.   HOSPITAL COURSE: This is a 59 year old female who presented to the hospital on 05/23/2013, due to shortness of breath and also blood-tinged sputum and cough.     1.  Pneumonia. This was likely a postobstructive pneumonia with underlying tumor. The patient does have a history of lung cancer, but it has been in remission. Apparently, her PET scan was positive on this admission. She was noted to have a right upper lobe mass. This was likely, therefore, postobstructive  pneumonia. The patient was started on IV Levaquin in the hospital, and maintained on it for about 48 hours. Sputum and blood cultures were obtained. Her sputum cultures remained negative. Her blood cultures also remain negative. After being treated with IV antibiotics, her clinical symptoms have significantly improved. She was, therefore, discharged on oral Levaquin for another 5 days for treatment of her underlying pneumonia.  2.  Lung cancer. This is likely a recurrent lung cancer. The patient had a PET scan done which showed a right upper lobe lung mass. The patient was seen by Dr. Oliva Bustard, and, as per his interpretation of the PET scan, he thought the tumor had recurred. He plans on seeing her as an outpatient in the next 1 week or so to discuss his plan of treatment for her at this point.  3.  Hyponatremia and hypokalemia. This was likely secondary to poor p.o. intake and also concomitant use of diuretics. The patient was hydrated with IV fluids. Her electrolytes were replaced, and her sodium and potassium have now normalized upon discharge.  4.  Chronic obstructive pulmonary disease. This was likely secondary to ongoing tobacco abuse. The patient did have some mild bronchospasm; therefore, she was given some low-dose IV steroids and maintained on some around-the-clock nebulizer treatments. She was discharged on a prednisone taper and oral antibiotics and continuation of her Advair.  5.  Hypertension. The patient remained hemodynamically stable. Her hydrochlorothiazide was initially held in the hospital as she was hypokalemic and hyponatremic, although she can resume her hydrochlorothiazide upon discharge, as her sodium and potassium have improved since admission.  6.  Urinary tract  infection. This is based off her urinalysis on admission. Her urine cultures, although were negative, she is being discharged on oral Levaquin, which should cover for the UTI too.    CODE STATUS: The patient is a DO NOT  INTUBATE/DO NOT RESUSCITATE.   She is being discharged home. She will follow up with Dr. Oliva Bustard in the next 1 to 2 weeks.    Time spent is 40 minutes.    ____________________________ Belia Heman. Verdell Carmine, MD vjs:dmm D: 05/30/2013 16:36:00 ET T: 05/30/2013 19:22:49 ET JOB#: [id number]  cc: Belia Heman. Verdell Carmine, MD, <Dictator> Martie Lee. Oliva Bustard, MD Henreitta Leber MD ELECTRONICALLY SIGNED 06/14/2013 11:23

## 2014-09-09 NOTE — Op Note (Signed)
PATIENT NAME:  Jeanette Jones, Jeanette Jones MR#:  [id number] DATE OF BIRTH:  [birth year]  DATE OF PROCEDURE:  09/24/2011   PREOPERATIVE DIAGNOSES: 1. Lung cancer.  2. Hypertension.  3. Chronic obstructive pulmonary disease.  POSTOPERATIVE DIAGNOSES:  1. Lung cancer.  2. Hypertension.  3. Chronic obstructive pulmonary disease.  PROCEDURES: 1. Ultrasound guidance for vascular access, left jugular vein.  2. Fluoroscopic guidance for placement of catheter.  3. Placement of a CT compatible Infuse-a-Port, left jugular vein.   SURGEON: Algernon Huxley, MD    ANESTHESIA: Local with moderate conscious sedation.   ESTIMATED BLOOD LOSS: Approximately 25 mL.   INDICATION FOR PROCEDURE: The patient is a 59 year old African American female with lung cancer. She needs a Port-A-Cath for chemotherapy and venous access. Risks and benefits were discussed. Informed consent was obtained.   DESCRIPTION OF PROCEDURE: The patient was brought to the Vascular Interventional Radiology Suite. Left neck and chest were sterilely prepped and draped and a sterile surgical field was created. Left jugular vein was visualized with ultrasound and found to be widely patent. It was then accessed under direct ultrasound guidance without difficulty with a Seldinger needle. A J-wire was placed. After skin nick and dilatation the peel-away sheath was placed over the wire. I then anesthetized an area in the left chest. Transverse incision was created and a pocket was created inferiorly. The catheter was then tunneled from the subclavicular incision to the access site. It was secured into the pocket with two Prolene sutures. After the catheter was connected to the port, the catheter was then cut to an appropriate length using fluoroscopic guidance placed through the peel-away sheath and the peel-away sheath was removed. The catheter tip was found to be in excellent location just into the right atrium without kinks. I then copiously irrigated the  incisions with antibiotic impregnated saline and closed the subclavicular incision with a 3-0 Vicryl and a 4-0 Monocryl and close the access incision with a single 4-0 Monocryl. The [place] needle was then used to access the port. It withdrew blood well and flushed easily with heparinized saline and Dermabond was placed as a dressing. The patient tolerated the procedure well and was taken to the recovery room in stable condition.   ____________________________ Algernon Huxley, MD jsd:drc D: 09/24/2011 12:58:16 ET T: 09/24/2011 13:10:07 ET JOB#: [id number]  cc: Algernon Huxley, MD, <Dictator> Martie Lee. [place]ok[place], MD Lew Dawes Genevive Bi, MD Algernon Huxley MD ELECTRONICALLY SIGNED 09/26/2011 7:41

## 2014-09-09 NOTE — Consult Note (Signed)
Reason for Visit: This 59 year old Female patient presents to the clinic for initial evaluation of  Lung cancer .   Referred by Dr. Oliva Bustard is.  Diagnosis:   Chief Complaint/Diagnosis   59 year old female with stage IIIB(T4, N3, M0) non-small cell lung cancer declined for surgery.   Pathology Report Pathology report reviewed non-small cell lung cancer favoring squamous cell    Imaging Report PET/CT scan reviewed    Referral Report Clinical notes reviewed    Planned Treatment Regimen Concurrent chemotherapy and radiation with curative intent    HPI   patient is a 59 year old female chronic smoker presented with increasing right-sided chest pain and hemoptysis. Initially was treated apparently for pneumonia although right upper lobe and right lower lobe mass is persisted. The Zocor both avid on PET/CT with also mediastinal adenopathy crossing the midline. Biopsy was performed and positive for non-small cell lung cancer favoring squamous cell carcinoma on cytology. Patient is been evaluated by thoracic oncology and is to decline for surgery based on her advanced disease. Her pulmonary function tests showed FEV1 of 72% of predicted.she seen today for consideration of radiation therapy. She is still having some right-sided chest pain and trace hemoptysis.  Past Hx:    HTN:    Depression:    copd:    Tubal Ligation:    Hemorrhoidectomy:   Past, Family and Social History:   Past Medical History positive    Cardiovascular hypertension    Respiratory COPD    Neurological/Psychiatric depression    Past Surgical History Hemorrhoidectomy, tubal ligation    Family History noncontributory    Social History positive    Social History Comments greater than 40-pack-year smoking histor daily beer use.y also    Additional Past Medical and Surgical History Seen by herself today   Allergies:   No Known Allergies:   Home Meds:  Home Medications: Medication Instructions Status   Humibid LA oral extended release tablet 1 tab(s) orally 2 times a day (every 12 hours) Active  Advair Diskus 250 mcg-50 mcg inhalation powder 1 puff(s) inhaled 2 times a day (every 12 hours) Active  Combivent inhalation aerosol with adapter 2 puff(s) inhaled every 4 hours while awake as needed- use with Opti-chamber Active  Norvasc 10 mg oral tablet 1 tab(s) orally once a day Active  tramadol 50 mg oral tablet 1 tab(s) orally every 4 hours, As Needed- for Pain  Active  ibuprofen  orally , As Needed- for Pain  Active   Review of Systems:   General negative    Performance Status (ECOG) 0    Skin negative    Breast negative    Ophthalmologic negative    ENMT negative    Respiratory and Thorax see HPI    Cardiovascular negative    Gastrointestinal negative    Genitourinary negative    Musculoskeletal negative    Neurological negative    Psychiatric see HPI    Hematology/Lymphatics negative    Endocrine negative    Allergic/Immunologic negative   Physical Exam:  General/Skin/HEENT:   General normal    Skin normal    Eyes normal    ENMT normal    Head and Neck normal    Additional PE Well-developed female in NAD. Teeth are in a fair state of repair. Lungs are clear to A&P cardiac examination shows regular rate and rhythm. No supraclavicular adenopathy is appreciated. Abdomen is benign with no organomegaly or masses noted.   Breasts/Resp/CV/GI/GU:   Respiratory and Thorax normal  Cardiovascular normal    Gastrointestinal normal    Genitourinary normal   MS/Neuro/Psych/Lymph:   Musculoskeletal normal    Neurological normal    Lymphatics normal   LAB Results:  Laboratory Results: Routine Chem:  16-Dec-12 02:39    Glucose, Serum 98   BUN 10   Creatinine (comp)   0.52   Sodium, Serum 137   Potassium, Serum 3.7   Chloride, Serum 102   CO2, Serum 23   Calcium (Total), Serum 9.3   Anion Gap 12   Osmolality (calc) 273   eGFR (African  American) >60   eGFR (Non-African American) >60   Cholesterol, Serum 105   Triglycerides, Serum 52   HDL (INHOUSE)   34   VLDL Cholesterol Calculated 10   LDL Cholesterol Calculated 61  Routine Hem:  16-Dec-12 02:39    Platelet Count (CBC) 185   WBC (CBC) 6.6   RBC (CBC) 4.48   Hemoglobin (CBC) 14.1   Hematocrit (CBC) 42.2   MCV 94   MCH 31.5   MCHC 33.3   RDW 14.0   Neutrophil % 47.3   Lymphocyte % 37.2   Monocyte % 14.4   Eosinophil % 0.5   Basophil % 0.6   Neutrophil # 3.1   Lymphocyte # 2.4   Monocyte #   0.9   Eosinophil # 0.0   Basophil # 0.0    06:21    Hemoglobin (CBC) 14.5   Hematocrit (CBC) 42.2   Other Results:  Radiology Results: Nuclear Med:    07-Mar-13 11:08, PET/CT Scan Lung Cancer Diagnosis   PET/CT Scan Lung Cancer Diagnosis    REASON FOR EXAM:    lung nodule evaluation  COMMENTS:       PROCEDURE: PET - PET/CT DX LUNG CA  - Jul 23 2011 11:08AM     RESULT: The patient has a fasting blood glucose level measuring 85 mg/dL.   The patient received a dose of 12.78 mCi of fluorine18 labeled   fluorodeoxyglucose in the right antecubital region at 9:40 a.m. with   imaging obtained between the hours of 10:48 a.m. and 11:05 a.m.   Noncontrast low-dose CT is performed over the same regions for the   purposes of attenuation correction and fusion. The noncontrast low-dose   CT, attenuation corrected PET images and fused PET/CT data demonstrate   abnormal localization of the right lung base where there is a soft tissue   nodule or small mass. The maximum SUV in this region is 11.45 with a mean   of 7.14. More superiorly there is a right upper lobe anterior     paramediastinal mass with some central decreased signal corresponding to   some low-attenuation on the CT which is consistent with probable   necrosis. This area shows a maximum uptake of 19.56 SUV with a mean of   11.99. Precarinal nodes show abnormal localization with a maximum SUV of   4.68 and a  mean of 3.11. The mass is contiguous with the right hilar   region and abuts the anterior pleural surface above the level of the   origin of the great vessels from the aorta. No additional areas of   abnormal tracer accumulation are identified other than the two right lung   regions and precarinal mediastinal localization. Normal-appearing GI, GU   and bony uptake isseen.    IMPRESSION:      Malignant disease in the right upper lobe and right lower lobe with   metastatic involvement  in the precarinal lymph nodes. The appearance     suggests possible primary malignancy in the paramediastinal right upper   lobe with a metastatic lesion to the right lung base. The possibility of   a metachronous primary is not excluded.    Thank you for the opportunity to contribute to the care of your patient.           Verified By: Sundra Aland, M.D., MD   Assessment and Plan:  Impression:   stage IIIB non-small cell lung cancer in 59 year old female  Plan:   patient's case was presented at our weekly tumor conference. She has been declined by thoracic surgery for resection based on pulmonary function tests and extent of surgery needed based on her IIIB status. I believe we can still incorporate both masses on IMRT treatment planning and delivery with the intent to spare as much normal lung volume on the right side as possible. We will incorporate both nodules as well as her mediastinal avid nodes by PET CT criteria. Risks and benefits of treatment including increasing fatigue cough some dysphasia was discussed with the patient and she seems to comprehend my treatment plan well.I have set her up for CT simulation early next week. I have discussed the case personally with Dr. Oliva Bustard and we will coordinate concurrent chemotherapy and radiation.  I would like to take this opportunity to thank you for allowing me to continue to participate in this patient's care.  Electronic Signatures: Armstead Peaks (MD)  (Signed 21-Mar-13 12:36)  Authored: HPI, Diagnosis, Past Hx, PFSH, Allergies, Home Meds, ROS, Physical Exam, LAB Results, Other Results, Encounter Assessment and Plan   Last Updated: 21-Mar-13 12:36 by Armstead Peaks (MD)

## 2014-09-09 NOTE — Op Note (Signed)
PATIENT NAME:  Jeanette Jones, Jeanette Jones MR#:  [id number] DATE OF BIRTH:  [birth year]  DATE OF PROCEDURE:  09/16/2011  PREOPERATIVE DIAGNOSIS: Lung cancer.   POSTOPERATIVE DIAGNOSIS: Lung cancer.   OPERATION PERFORMED: Attempt at left-sided Port-A-Cath placement.   SURGEON: Juanito Gonyer E. Genevive Bi, MD   ASSISTANT: None.   INDICATIONS FOR PROCEDURE: Ms. Mackowiak is a 59 year old woman with a history of unresectable lung cancer on the right side. She has a very large right upper lobe mass which is being treated with chemotherapy and radiation therapy and she required Port-A-Cath access for continuation of her chemotherapy. The indications and risks of the procedure were explained to the patient who gave her informed consent.   DESCRIPTION OF PROCEDURE: The patient was brought to the operating suite and placed in the supine position. Because she has a cough with recumbency, we elected to use a laryngeal mask airway for anesthesia. She was prepped and draped in the usual sterile fashion. I used the ultrasound probe to identify the left internal jugular vein. We chose the left side because the right side is where the tumor is and did not want to interfere with radiation ports. The left internal jugular vein was percutaneously catheterized and the wire was introduced. However, the wire would not thread into the right side of the heart and continued to go out the right arm. Because of this, we could not place the catheter in this position. I then attempted multiple times to percutaneously cannulate the subclavian vein which was unsuccessful. Fluoroscopy was used and I could not identify any untoward complications at the end of the multiple attempts. We, therefore, abandoned the procedure and elected to stop at this point and obtain a chest x-ray in the recovery room. Therefore, the patient was awakened from general endotracheal anesthesia and she was taken to the recovery room in stable condition.    ____________________________ Jeanette Dawes Genevive Bi, MD teo:drc D: 09/16/2011 12:56:20 ET T: 09/16/2011 13:06:25 ET JOB#: [id number] Louis Matte MD ELECTRONICALLY SIGNED 09/17/2011 14:31

## 2014-09-09 NOTE — H&P (Signed)
PATIENT NAME:  Jeanette Jones, Jeanette Jones MR#:  [id number] DATE OF BIRTH:  11/11/1955  DATE OF ADMISSION:  05/02/2011  PRIMARY CARE PHYSICIAN: None  ER PHYSICIAN: Dr. Renard Hamper   CHIEF COMPLAINT:  Right-sided chest pain.   HISTORY OF PRESENT ILLNESS: This is a 59 year old female with history of tobacco abuse and EtOH abuse who came in because of right-sided chest pain since yesterday. She said that whenever she coughs she gets chest pain. It is nonradiating in type. No relieving factors. Aggravated with cough.  She also has had trouble breathing and cough for the last two years. She said she has had a cough for two years on and off and also noted some hemoptysis two days ago. She denies any nausea or vomiting. She says that she does have some weight loss.   PAST MEDICAL HISTORY:  1. Hypertension.  2. Chronic obstructive pulmonary disease.  3. Depression   ALLERGIES: No known allergies.   SOCIAL HISTORY: Still smoking 1 pack per day, about 30 pack-years. Also drinks alcohol, about 30 ounces of beer every day. Last drink was yesterday morning. No drugs. The patient lives with an elderly man and works as a Press photographer.   PAST SURGICAL HISTORY:  Significant for tubal ligation.   FAMILY HISTORY: Significant for cancer.   REVIEW OF SYSTEMS:  CONSTITUTIONAL: Feels fatigued and also recently recovering from shingles. EYES: No blurred vision. ENT: Complains of being hard of hearing. RESPIRATORY: Has some cough, hemoptysis, and trouble breathing. CARDIOVASCULAR: Right-sided chest pain present. No orthopnea. No pedal edema. No palpitations. GI: No nausea. No vomiting. No abdominal pain. GU: No dysuria or hematuria. ENDOCRINE: No polyuria or nocturia. INTEGUMENT: No skin rashes. MUSCULOSKELETAL: Complains of low back pain on and off along with shoulder pain and knee pain. NEUROLOGIC: Recently recovered from shingles. Denies any weakness or dysarthria. PSYCH: No anxiety or insomnia but right now she does have some tremors  due to alcohol withdrawal.   PHYSICAL EXAMINATION:  VITAL SIGNS: Temperature 97.9, pulse is 87, respirations 20, blood pressure 163/102 when she came in.  Sats are 97% on room air.   GENERAL:  The patient is a 59 year old female who is slightly malnourished, not in distress.   HEENT: Head atraumatic, normocephalic. Pupils are equally reacting to light. Extraocular movements are intact.  ENT: No tympanic congestion. No turbinate hypertrophy. No oropharyngeal erythema.   NECK: Normal range of motion. No JVD. No carotid bruit.   CARDIOVASCULAR: S1 and S2 regular. No murmurs.   LUNGS: Bilaterally clear to auscultation. No wheeze. No rales.   ABDOMEN: Soft, nontender, nondistended. Bowel sounds present.   EXTREMITIES: The patient has no extremity edema. No cyanosis. No clubbing.   SKIN: Old healed shingles scars are present.  LYMPH:  No lymphadenopathy.  NEUROLOGICAL: Cranial nerves II-XII intact. Power 5/5 in upper and lower extremities.  Sensation intact.  DTRs 2+ bilaterally.  PSYCH: Oriented to time, place, and person.  LABORATORY, DIAGNOSTIC, AND RADIOLOGICAL DATA:  CT scan of the chest showed no main, right, or left pulmonary artery emboli. No thoracic aortic aneurysm. Mild prominent mediastinal lymph nodes. 4-cm spiculated right upper lobe mass with surrounding areas of somewhat nodular inflammation and 2.5-cm spiculated right lower lobe lesion is noted. Chronic prominence of left adrenal gland.  The patient's pulmonary findings may be related to inflammation versus infection like atypical infection, but are concerning for neoplastic process like metastatic lung carcinoma with associated evolving pneumonia in the right upper lobe.   Electrolytes: Sodium 134, potassium 3.9,  chloride 100, bicarbonate 24, BUN 9, creatinine 0.58, glucose 88, WBC 7.9, hemoglobin 15.6, hematocrit 45.5, platelets 207, INR 1.1. Troponin less than 0.02. Magnesium 1.6. LFTs showed alkaline phosphatase 218.    ASSESSMENT AND PLAN:  25. 59 year old female with right-sided chest pain, likely secondary to underlying pneumonia. The patient is admitted to the hospitalist service. We will start her on Rocephin and Zithromax along with Combivent and Spiriva.  2. Possible right upper lobe mass. Cannot rule out cancer. We will get a pulmonary consult. She also probably needs a biopsy.  3. Tobacco abuse.  Counseled the patient about smoking cessation. We will offer nicotine patch.  4. EtOH abuse in slight withdrawal at this time. Counseled the patient. We will use CIWA protocol.  5. Uncontrolled hypertension. The patient should be taking medicine for blood pressure. She is not taking it due to financial reasons. We are going to start her on Norvasc 5 mg p.o. daily and follow on telemetry.   TOTAL TIME SPENT:  60 minutes.   ____________________________ Epifanio Lesches, MD sk:bjt D: 05/02/2011 20:09:31 ET T: 05/03/2011 09:57:26 ET JOB#: [id number]  cc: Epifanio Lesches, MD, <Dictator> Epifanio Lesches MD ELECTRONICALLY SIGNED 05/21/2011 19:16

## 2014-09-10 DIAGNOSIS — G8929 Other chronic pain: Secondary | ICD-10-CM | POA: Insufficient documentation

## 2014-09-10 DIAGNOSIS — M549 Dorsalgia, unspecified: Secondary | ICD-10-CM

## 2014-09-10 DIAGNOSIS — R1310 Dysphagia, unspecified: Secondary | ICD-10-CM | POA: Insufficient documentation

## 2014-09-10 DIAGNOSIS — B182 Chronic viral hepatitis C: Secondary | ICD-10-CM | POA: Insufficient documentation

## 2014-09-21 ENCOUNTER — Other Ambulatory Visit: Payer: Self-pay | Admitting: *Deleted

## 2014-09-21 MED ORDER — HYDROCODONE-ACETAMINOPHEN 5-325 MG PO TABS: 1.0000 | ORAL_TABLET | Freq: Four times a day (QID) | ORAL | Status: DC | PRN

## 2014-09-21 NOTE — Telephone Encounter (Signed)
Having shoulder pain in non cancer side, plans on calling PMD regarding it but says she needs [place] to refill her pain med

## 2014-10-01 ENCOUNTER — Ambulatory Visit: Payer: Medicare Other

## 2014-10-05 ENCOUNTER — Other Ambulatory Visit: Payer: Self-pay | Admitting: *Deleted

## 2014-10-05 DIAGNOSIS — C3490 Malignant neoplasm of unspecified part of unspecified bronchus or lung: Secondary | ICD-10-CM

## 2014-10-08 ENCOUNTER — Telehealth: Payer: Self-pay | Admitting: *Deleted

## 2014-10-08 DIAGNOSIS — R040 Epistaxis: Secondary | ICD-10-CM

## 2014-10-08 NOTE — Telephone Encounter (Signed)
Add METC and PT per Dr Oliva Bustard

## 2014-10-09 ENCOUNTER — Encounter: Payer: Self-pay | Admitting: Oncology

## 2014-10-09 ENCOUNTER — Inpatient Hospital Stay: Payer: Medicare Other | Attending: Oncology

## 2014-10-09 ENCOUNTER — Encounter (INDEPENDENT_AMBULATORY_CARE_PROVIDER_SITE_OTHER): Payer: Self-pay

## 2014-10-09 ENCOUNTER — Inpatient Hospital Stay (HOSPITAL_BASED_OUTPATIENT_CLINIC_OR_DEPARTMENT_OTHER): Payer: Medicare Other | Admitting: Oncology

## 2014-10-09 VITALS — BP 116/83 | HR 90 | Temp 95.2°F | Wt 123.0 lb

## 2014-10-09 DIAGNOSIS — C3490 Malignant neoplasm of unspecified part of unspecified bronchus or lung: Secondary | ICD-10-CM | POA: Insufficient documentation

## 2014-10-09 DIAGNOSIS — R040 Epistaxis: Secondary | ICD-10-CM | POA: Diagnosis not present

## 2014-10-09 DIAGNOSIS — E876 Hypokalemia: Secondary | ICD-10-CM | POA: Diagnosis not present

## 2014-10-09 DIAGNOSIS — B1920 Unspecified viral hepatitis C without hepatic coma: Secondary | ICD-10-CM | POA: Diagnosis not present

## 2014-10-09 DIAGNOSIS — Z79899 Other long term (current) drug therapy: Secondary | ICD-10-CM | POA: Diagnosis not present

## 2014-10-09 DIAGNOSIS — I10 Essential (primary) hypertension: Secondary | ICD-10-CM | POA: Insufficient documentation

## 2014-10-09 DIAGNOSIS — F17210 Nicotine dependence, cigarettes, uncomplicated: Secondary | ICD-10-CM

## 2014-10-09 DIAGNOSIS — J449 Chronic obstructive pulmonary disease, unspecified: Secondary | ICD-10-CM | POA: Diagnosis not present

## 2014-10-09 LAB — COMPREHENSIVE METABOLIC PANEL
ALT: 79 U/L — ABNORMAL HIGH (ref 14–54)
ANION GAP: 6 (ref 5–15)
AST: 130 U/L — AB (ref 15–41)
Albumin: 4.1 g/dL (ref 3.5–5.0)
Alkaline Phosphatase: 173 U/L — ABNORMAL HIGH (ref 38–126)
BILIRUBIN TOTAL: 1 mg/dL (ref 0.3–1.2)
BUN: 9 mg/dL (ref 6–20)
CALCIUM: 9.2 mg/dL (ref 8.9–10.3)
CHLORIDE: 97 mmol/L — AB (ref 101–111)
CO2: 26 mmol/L (ref 22–32)
Creatinine, Ser: 0.67 mg/dL (ref 0.44–1.00)
GFR calc Af Amer: >60 mL/min (ref >60)
GFR calc non Af Amer: >60 mL/min (ref >60)
GLUCOSE: 103 mg/dL — AB (ref 65–99)
POTASSIUM: 3.2 mmol/L — AB (ref 3.5–5.1)
SODIUM: 129 mmol/L — AB (ref 135–145)
Total Protein: 9.9 g/dL — ABNORMAL HIGH (ref 6.5–8.1)

## 2014-10-09 LAB — CBC WITH DIFFERENTIAL/PLATELET
BASOS PCT: 1 %
Basophils Absolute: 0 10*3/uL (ref 0–0.1)
EOS PCT: 2 %
Eosinophils Absolute: 0.1 10*3/uL (ref 0–0.7)
HCT: 40.8 % (ref 35.0–47.0)
HEMOGLOBIN: 13.9 g/dL (ref 12.0–16.0)
LYMPHS ABS: 1.2 10*3/uL (ref 1.0–3.6)
LYMPHS PCT: 36 %
MCH: 32.1 pg (ref 26.0–34.0)
MCHC: 34.2 g/dL (ref 32.0–36.0)
MCV: 93.8 fL (ref 80.0–100.0)
MONO ABS: 0.4 10*3/uL (ref 0.2–0.9)
MONOS PCT: 12 %
Neutro Abs: 1.7 10*3/uL (ref 1.4–6.5)
Neutrophils Relative %: 49 %
PLATELETS: 242 10*3/uL (ref 150–440)
RBC: 4.35 MIL/uL (ref 3.80–5.20)
RDW: 14.5 % (ref 11.5–14.5)
WBC: 3.4 10*3/uL — ABNORMAL LOW (ref 3.6–11.0)

## 2014-10-09 LAB — PROTIME-INR
INR: 1.08
PROTHROMBIN TIME: 14.2 s (ref 11.4–15.0)

## 2014-10-09 MED ORDER — AMLODIPINE BESYLATE 5 MG PO TABS: 5.0000 mg | ORAL_TABLET | Freq: Every day | ORAL | Status: DC

## 2014-10-09 MED ORDER — HYDROCODONE-ACETAMINOPHEN 5-325 MG PO TABS: 1.0000 | ORAL_TABLET | Freq: Four times a day (QID) | ORAL | Status: DC | PRN

## 2014-10-09 MED ORDER — HYDROCHLOROTHIAZIDE 12.5 MG PO TABS: 12.5000 mg | ORAL_TABLET | Freq: Every day | ORAL | Status: DC

## 2014-10-09 NOTE — Progress Notes (Signed)
Pt is current smoker - states about 2 a day, mostly lets them burn up.  Does have living will.

## 2014-10-12 ENCOUNTER — Other Ambulatory Visit: Payer: Self-pay | Admitting: Family Medicine

## 2014-10-12 ENCOUNTER — Other Ambulatory Visit: Payer: Self-pay | Admitting: *Deleted

## 2014-10-12 ENCOUNTER — Inpatient Hospital Stay: Payer: Medicare Other

## 2014-10-12 ENCOUNTER — Encounter: Payer: Self-pay | Admitting: Family Medicine

## 2014-10-12 ENCOUNTER — Ambulatory Visit
Admission: RE | Admit: 2014-10-12 | Discharge: 2014-10-12 | Disposition: A | Discharge status: Home / Self Care | Payer: Medicare Other | Source: Ambulatory Visit | Attending: Oncology | Admitting: Oncology

## 2014-10-12 DIAGNOSIS — C3490 Malignant neoplasm of unspecified part of unspecified bronchus or lung: Secondary | ICD-10-CM

## 2014-10-12 DIAGNOSIS — M8580 Other specified disorders of bone density and structure, unspecified site: Secondary | ICD-10-CM | POA: Diagnosis not present

## 2014-10-12 DIAGNOSIS — I2510 Atherosclerotic heart disease of native coronary artery without angina pectoris: Secondary | ICD-10-CM | POA: Diagnosis not present

## 2014-10-12 DIAGNOSIS — M8448XA Pathological fracture, other site, initial encounter for fracture: Secondary | ICD-10-CM | POA: Insufficient documentation

## 2014-10-12 DIAGNOSIS — K760 Fatty (change of) liver, not elsewhere classified: Secondary | ICD-10-CM | POA: Diagnosis not present

## 2014-10-12 LAB — GLUCOSE, CAPILLARY: Glucose-Capillary: 85 mg/dL (ref 65–99)

## 2014-10-12 MED ORDER — FLUDEOXYGLUCOSE F - 18 (FDG) INJECTION
11.6400 | Freq: Once | INTRAVENOUS | Status: AC | PRN
  Administered 2014-10-12: 11.64 via INTRAVENOUS

## 2014-10-14 ENCOUNTER — Encounter: Payer: Self-pay | Admitting: Oncology

## 2014-10-14 NOTE — Progress Notes (Signed)
[place] @ [facility] Telephone:(336) [phone]  Fax:(336) [place] OB: [birth year]  MR#: [id number]  VOP#:[id number]  Patient Care Team: No Pcp Per Patient as PCP - General (General Practice)  CHIEF COMPLAINT:  Chief Complaint  Patient presents with  . Follow-up    Oncology History   Chief Complaint/Diagnosis:   1. 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 Faith Rogue, cardiothoracic surgeon) 2. Started on radiation and concurrent chemotherapy with carboplatinum Taxol from April of 2013 3. Finished radiation and chemotherapy with carboplatinum and Taxol in May of 2013 4. Stage IIIB (T4N3M0) NSCLC who completed a course of EBRT on Oct 07, 2011 with a total dose of 7000cGy.   5. Starting consolidation with 2 cycles of carboplatinum and Abraxane 6.patient is off chemotherapy since November of 2013.  Repeat CT scan shows stable disease(January, 2014) 7  Started been on Taxotere and  RAMICIRUMAB from June 07, 2013 8.chemotherapy with Taxotere  and  RAMUCIRUMAB discontinued in March of 2015 because of hemoptysis 9.Started [facility] April of 2015. 10NIVOLULAMABWas put on hold because of abnormal liver enzymes January of 2016     Carcinoma of lung   10/12/2014 Initial Diagnosis Carcinoma of lung    No flowsheet data found.  INTERVAL HISTORY: 59 year old lady with history of carcinoma lung.  Squamous cell carcinoma stage IIIA disease.  The patient is here as an add-on.  Patient did not go for routine CT scan which had been scheduled.  Patient complains of epistaxis.  Also has hepatitis C for which patient has not taken any treatment.  Continues to drink and smoke  REVIEW OF SYSTEMS:   Gen. status: Feeling weak and tired.  HEENT : Complains of epistaxis off and on Lungs: Dry hacking cough.  No hemoptysis.   Cardiovascular system: No chest pain.  No palpitation.  No  paroxysmal nocturnal dyspnea. Gastro intestinal system: No heartburn.  No nausea or vomiting.  No abdominal pain.  No diarrhea.  No constipation.  No rectal bleeding. Skin: No evidence of ecchymosis or rash. Neurological system: No dizziness.  No tingling.  His was.  no tingling numbness.  no focal weakness or any focal signs. Lower extreme no edema       As per HPI. Otherwise, a complete review of systems is negatve.  PAST MEDICAL HISTORY: Past Medical History  Diagnosis Date  . Hep C w/o coma, chronic   . Back pain, chronic   . Swallowing difficulty   . Emphysema of lung   . Heart murmur   . Lung cancer   . Depression   . Carcinoma of lung 10/12/2014    PAST SURGICAL HISTORY: Past Surgical History  Procedure Laterality Date  . Lung biopsy    . Tubal ligation    . Hemorroidectomy      FAMILY HISTORY  No Known Allergies:   Significant History/PMH:   Hepatitis C:    Back Pain, Chronic:    Swallowing Difficulty:    Gastric Reflux:    Heart Murmur:    Lung Cancer:    HTN:    Depression:    copd:    Lung Biopsy:    Tubal Ligation:    Hemorrhoidectomy:   Preventive Screening:  Has patient had any of the following test? Mammography  Pap Smear (1)   Last Mammography: 2011(1)   Last Pap Smear: 10 yrs ago?(1)   Smoking History:  Smoking History 1(1)Packs per day(1)Smoking Cessation Information Given to Patient (1)  PFSH: Family History: negative, No family history of colorectal cancer, breast cancer, ovarian cancer  Comments: No family history of colorectal cancer, breast cancer, or ovarian cancer.  Social History: positive alcohol, positive tobacco  Smoker: Refused smoking cessation counseling  Smoker- Packs Per Day: 1  Smoker- How Many Years: 80  Comments: Patient is a chronic smoker one pack per day for more than 40 years.  Also drinks beer.  Does not use any illegal decrease in her drug  Additional Past Medical and Surgical History:  Hypertension, COPD, chronic tobacco abuse.      ADVANCED DIRECTIVES: Patient does not have any advanced healthcare directive. Information has been given.   HEALTH MAINTENANCE: History  Substance Use Topics  . Smoking status: Current Every Day Smoker -- 0.25 packs/day    Types: Cigarettes  . Smokeless tobacco: Not on file  . Alcohol Use: Not on file   :  No Known Allergies  Current Outpatient Prescriptions  Medication Sig Dispense Refill  . amLODipine (NORVASC) 5 MG tablet Take 1 tablet (5 mg total) by mouth daily. 30 tablet 6  . Fluticasone-Salmeterol (ADVAIR) 250-50 MCG/DOSE AEPB Inhale 1 puff into the lungs 2 (two) times daily as needed.    Marland Kitchen HYDROcodone-acetaminophen (NORCO/VICODIN) 5-325 MG per tablet Take 1 tablet by mouth every 6 (six) hours as needed for moderate pain. 60 tablet 0  . Ipratropium-Albuterol (COMBIVENT) 20-100 MCG/ACT AERS respimat Inhale 2 puffs into the lungs 2 (two) times daily as needed for wheezing.    . hydrochlorothiazide (HYDRODIURIL) 12.5 MG tablet Take 1 tablet (12.5 mg total) by mouth daily. 30 tablet 6   No current facility-administered medications for this visit.    OBJECTIVE:  Filed Vitals:   10/09/14 1144  BP: 116/83  Pulse: 90  Temp: 95.2 F (35.1 C)     Body mass index is 22.52 kg/(m^2).    ECOG FS:1 - Symptomatic but completely ambulatory  PHYSICAL EXAM: Gen. status: Patient is alert and oriented somewhat apprehensive HEENT: There is no evidence of active bleeding.  No jaundice.    lungs: Emphysematous chest.  No rhonchi or rales diminished air entry on both sides Cardiac exam revealed the PMI to be normally situated and sized. The rhythm was regular and no extrasystoles were noted during several minutes of auscultation. The first and second heart sounds were normal and physiologic splitting of the second heart sound was noted. There were no murmurs, rubs, clicks, or gallops Abdominal exam revealed normal bowel sounds. The abdomen  was soft, non-tender, and without masses, organomegaly, or appreciable enlargement of the abdominal aorta. Examination of the skin revealed no evidence of significant rashes, suspicious appearing nevi or other concerning lesions. Neurologically, the patient was awake, alert, and oriented to person, place and time. There were no obvious focal neurologic abnormalities.   LAB RESULTS:  Appointment on 10/09/2014  Component Date Value Ref Range Status  . WBC 10/09/2014 3.4* 3.6 - 11.0 K/uL Final  . RBC 10/09/2014 4.35  3.80 - 5.20 MIL/uL Final  . Hemoglobin 10/09/2014 13.9  12.0 - 16.0 g/dL Final  . HCT 10/09/2014 40.8  35.0 - 47.0 % Final  . MCV 10/09/2014 93.8  80.0 - 100.0 fL Final  . MCH 10/09/2014 32.1  26.0 - 34.0 pg Final  . MCHC 10/09/2014 34.2  32.0 - 36.0 g/dL Final  . RDW 10/09/2014 14.5  11.5 - 14.5 % Final  . Platelets 10/09/2014 242  150 - 440 K/uL Final  . Neutrophils Relative % 10/09/2014 49   Final  . Neutro Abs 10/09/2014 1.7  1.4 - 6.5 K/uL Final  . Lymphocytes Relative 10/09/2014 36   Final  . Lymphs Abs 10/09/2014 1.2  1.0 - 3.6 K/uL Final  . Monocytes Relative 10/09/2014 12   Final  . Monocytes Absolute 10/09/2014 0.4  0.2 - 0.9 K/uL Final  . Eosinophils Relative 10/09/2014 2   Final  . Eosinophils Absolute 10/09/2014 0.1  0 - 0.7 K/uL Final  . Basophils Relative 10/09/2014 1   Final  . Basophils Absolute 10/09/2014 0.0  0 - 0.1 K/uL Final  . Sodium 10/09/2014 129* 135 - 145 mmol/L Final  . Potassium 10/09/2014 3.2* 3.5 - 5.1 mmol/L Final  . Chloride 10/09/2014 97* 101 - 111 mmol/L Final  . CO2 10/09/2014 26  22 - 32 mmol/L Final  . Glucose, Bld 10/09/2014 103* 65 - 99 mg/dL Final  . BUN 10/09/2014 9  6 - 20 mg/dL Final  . Creatinine, Ser 10/09/2014 0.67  0.44 - 1.00 mg/dL Final  . Calcium 10/09/2014 9.2  8.9 - 10.3 mg/dL Final  . Total Protein 10/09/2014 9.9* 6.5 - 8.1 g/dL Final  . Albumin 10/09/2014 4.1  3.5 - 5.0 g/dL Final  . AST 10/09/2014 130* 15 - 41 U/L  Final  . ALT 10/09/2014 79* 14 - 54 U/L Final  . Alkaline Phosphatase 10/09/2014 173* 38 - 126 U/L Final  . Total Bilirubin 10/09/2014 1.0  0.3 - 1.2 mg/dL Final  . GFR calc non Af Amer 10/09/2014 >60  >60 mL/min Final  . GFR calc Af Amer 10/09/2014 >60  >60 mL/min Final   Comment: (NOTE) The eGFR has been calculated using the CKD EPI equation. This calculation has not been validated in all clinical situations. eGFR's persistently <60 mL/min signify possible Chronic Kidney Disease.   . Anion gap 10/09/2014 6  5 - 15 Final  . Prothrombin Time 10/09/2014 14.2  11.4 - 15.0 seconds Final  . INR 10/09/2014 1.08   Final    No results found for: LABCA2 No results found for: CA199 Lab Results  Component Value Date   CEA 2.5 07/21/2011    STUDIES: Nm Pet Image Restag (ps) Skull Base To Thigh  10/12/2014   CLINICAL DATA:  Subsequent treatment strategy for malignant neoplasm of lung. Right upper lobe primary. Restaging. Hepatitis-C.  EXAM: NUCLEAR MEDICINE PET SKULL BASE TO THIGH  TECHNIQUE: 11.6 MCi F-18 FDG was injected intravenously. Full-ring PET imaging was performed from the skull base to thigh after the radiotracer. CT data was obtained and used for attenuation correction and anatomic localization.  FASTING BLOOD GLUCOSE:  Value: 85 mg/dl  COMPARISON:  05/29/2014  FINDINGS: NECK  No areas of abnormal hypermetabolism.  CHEST  Anterior right upper lobe/apical consolidation with air bronchograms again identified. This measures a S.U.V. max of 3.1 today. On the prior exam, measured a S.U.V. max of 2.8.  A right paratracheal node measures 9 mm and a S.U.V. max of 2.7 on image 81. On the prior exam, this measured 8 mm and a S.U.V. max of 2.6.  Subcarinal nodal tissue is difficult to measure secondary ill-defined nature. Hypermetabolism measures a S.U.V. max of 3.0 on image 85. On the prior exam, this measured a S.U.V. max of 3.2.  ABDOMEN/PELVIS  No findings to suggest hypermetabolic metastatic  disease within the abdomen or pelvis. Right pelvic hypermetabolism is in the vicinity of right adnexal calcifications. This measures a  S.U.V. max of 9.5 today. On the prior exam, measured a S.U.V. max of 7.5.  SKELETON  No abnormal marrow activity.  CT IMAGES PERFORMED FOR ATTENUATION CORRECTION  No cervical adenopathy.  A left Port-A-Cath which terminates at the high right atrium. Normal heart size with advanced multivessel coronary artery atherosclerosis. Ill definition of fat planes within the mediastinum is similar and likely radiation induced. Centrilobular emphysema. Medial right lung base consolidation is not significantly hypermetabolic and unchanged, including back to 01/30/2014 CT.  Advanced hepatic steatosis. Left adrenal thickening. Suspect uterine fibroids with foci of uterine calcification and hyper attenuation.  Osteopenia.  Right anterior first rib fracture on image 71 is chronic. Adjacent to presumed radiation change.  IMPRESSION: 1. No significant change in low-level hypermetabolism corresponding to right apical consolidation, likely radiation induced. No well-defined residual or recurrent disease. 2. Similarly, mediastinal nodes demonstrate persistent low-level hypermetabolism and are favored to be reactive. 3. A right pelvic focus of hypermetabolism is persistent since the prior exam. Although this could be secondary to the right ureter, an ovarian or adnexal process is suspected. Considerations include a hypermetabolic exophytic fibroid or an ovarian lesion. This could either be re-evaluated on follow-up PET or more entirely characterized with pelvic ultrasound. 4. Age advanced atherosclerosis, including within the coronary arteries. 5. Right anterior first rib fracture is favored to be radiation induced and was present on the prior exam. 6. Incidental findings, including hepatic steatosis, probable right lower lobe scarring, and uterine fibroids.   Electronically Signed   By: Abigail Miyamoto M.D.    On: 10/12/2014 16:47    ASSESSMENT: 59 year old lady with stage III persistent or recurrent lung cancer treated with pneumonia and a man.    epistaxis Hepatitis C Chronic alcohol and tobacco abuse  MEDICAL DECISION MAKING:  All lab data has been reviewed.    PET scan has been ordered and reviewed that he is very low level activity of hypermetabolic activity in the lung. There is some hyper metabolic activity in pelvic area which can be reevaluated with repeat PET scanning Epistaxes  Patient was referred to ENT evaluation  Hepatitis C  Patient was advised to continue follow-up with gastroenterologist  Patient expressed understanding and was in agreement with this plan. She also understands that She can call clinic at any time with any questions, concerns, or complaints.    No matching staging information was found for the patient.  Forest Gleason, MD   10/14/2014 6:59 PM

## 2014-10-16 ENCOUNTER — Inpatient Hospital Stay: Payer: Medicare Other

## 2014-10-16 ENCOUNTER — Inpatient Hospital Stay (HOSPITAL_BASED_OUTPATIENT_CLINIC_OR_DEPARTMENT_OTHER): Payer: Medicare Other | Admitting: Oncology

## 2014-10-16 VITALS — BP 129/75 | HR 88 | Temp 96.8°F | Resp 18 | Ht 62.0 in | Wt 124.0 lb

## 2014-10-16 DIAGNOSIS — I10 Essential (primary) hypertension: Secondary | ICD-10-CM

## 2014-10-16 DIAGNOSIS — C3490 Malignant neoplasm of unspecified part of unspecified bronchus or lung: Secondary | ICD-10-CM

## 2014-10-16 DIAGNOSIS — B1920 Unspecified viral hepatitis C without hepatic coma: Secondary | ICD-10-CM

## 2014-10-16 DIAGNOSIS — E876 Hypokalemia: Secondary | ICD-10-CM | POA: Diagnosis not present

## 2014-10-16 DIAGNOSIS — F17210 Nicotine dependence, cigarettes, uncomplicated: Secondary | ICD-10-CM

## 2014-10-16 DIAGNOSIS — C801 Malignant (primary) neoplasm, unspecified: Secondary | ICD-10-CM

## 2014-10-16 DIAGNOSIS — J449 Chronic obstructive pulmonary disease, unspecified: Secondary | ICD-10-CM

## 2014-10-16 DIAGNOSIS — R040 Epistaxis: Secondary | ICD-10-CM

## 2014-10-16 DIAGNOSIS — Z79899 Other long term (current) drug therapy: Secondary | ICD-10-CM

## 2014-10-16 MED ORDER — SODIUM CHLORIDE 0.9 % IJ SOLN
10.0000 mL | INTRAMUSCULAR | Status: DC | PRN
  Administered 2014-10-16: 10 mL via INTRAVENOUS
  Filled 2014-10-16: qty 10

## 2014-10-16 MED ORDER — HEPARIN SOD (PORK) LOCK FLUSH 100 UNIT/ML IV SOLN
500.0000 [IU] | Freq: Once | INTRAVENOUS | Status: AC
  Administered 2014-10-16: 500 [IU] via INTRAVENOUS
  Filled 2014-10-16: qty 5

## 2014-10-16 NOTE — Progress Notes (Signed)
Met with patient at her visit today.  Patient returned completed survey as required to complete [place] 1065.4 research study enrollment.  I provided patient with $100.00 gift card provided by [employer] as compensation for participating in the study. I thanked patient for her time and completion of survey.  I spent less than 5 minutes with patient.

## 2014-10-17 ENCOUNTER — Encounter: Payer: Self-pay | Admitting: Oncology

## 2014-10-17 NOTE — Progress Notes (Signed)
[name] @ [facility] Telephone:(336) [phone]  Fax:(336) [place] OB: 03/20/1956  MR#: [id number]  ENI#:[id number]  Patient Care Team: No Pcp Per Patient as PCP - General (General Practice)  CHIEF COMPLAINT:  Chief Complaint  Patient presents with  . Follow-up    lung ca    Oncology History   Chief Complaint/Diagnosis:   1. 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 Faith Rogue, cardiothoracic surgeon) 2. Started on radiation and concurrent chemotherapy with carboplatinum Taxol from April of 2013 3. Finished radiation and chemotherapy with carboplatinum and Taxol in May of 2013 4. Stage IIIB (T4N3M0) NSCLC who completed a course of EBRT on Oct 07, 2011 with a total dose of 7000cGy.   5. Starting consolidation with 2 cycles of carboplatinum and Abraxane 6.patient is off chemotherapy since November of 2013.  Repeat CT scan shows stable disease(January, 2014) 7  Started been on Taxotere and  RAMICIRUMAB from June 07, 2013 8.chemotherapy with Taxotere  and  RAMUCIRUMAB discontinued in March of 2015 because of hemoptysis 9.Started [facility] April of 2015. 10NIVOLULAMABWas put on hold because of abnormal liver enzymes January of 2016     Carcinoma of lung   10/12/2014 Initial Diagnosis Carcinoma of lung    No flowsheet data found.  INTERVAL HISTORY: 59 year old lady with history of carcinoma lung.  Squamous cell carcinoma stage IIIA disease.  The patient is here as an add-on.  Patient did not go for routine CT scan which had been scheduled.  Patient complains of epistaxis.  Also has hepatitis C for which patient has not taken any treatment.  Continues to drink and smoke Oct 16, 2014 Patient is here for ongoing evaluation regarding carcinoma of lung  Patient had a PET scan done. Patient has epistaxis which is resolved but going to see ENT physician Here to discuss  the result of the PET scan and further planning of treatment REVIEW OF SYSTEMS:   Gen. status: Feeling weak and tired.  HEENT : Complains of epistaxis off and on since been evaluated by ENT surgeon Lungs: Dry hacking cough.  No hemoptysis.   Cardiovascular system: No chest pain.  No palpitation.  No paroxysmal nocturnal dyspnea. Gastro intestinal system: No heartburn.  No nausea or vomiting.  No abdominal pain.  No diarrhea.  No constipation.  No rectal bleeding. Skin: No evidence of ecchymosis or rash. Neurological system: No dizziness.  No tingling.  His was.  no tingling numbness.  no focal weakness or any focal signs. Lower extreme no edema       As per HPI. Otherwise, a complete review of systems is negatve.  PAST MEDICAL HISTORY: Past Medical History  Diagnosis Date  . Hep C w/o coma, chronic   . Back pain, chronic   . Swallowing difficulty   . Emphysema of lung   . Heart murmur   . Lung cancer   . Depression   . Carcinoma of lung 10/12/2014    PAST SURGICAL HISTORY: Past Surgical History  Procedure Laterality Date  . Lung biopsy    . Tubal ligation    . Hemorroidectomy      FAMILY HISTORY  No Known Allergies:   Significant History/PMH:   Hepatitis C:    Back Pain, Chronic:    Swallowing Difficulty:    Gastric Reflux:    Heart Murmur:    Lung Cancer:    HTN:  Depression:    copd:    Lung Biopsy:    Tubal Ligation:    Hemorrhoidectomy:   Preventive Screening:  Has patient had any of the following test? Mammography  Pap Smear (1)   Last Mammography: 2011(1)   Last Pap Smear: 10 yrs ago?(1)   Smoking History: Smoking History 1(1)Packs per day(1)Smoking Cessation Information Given to Patient (1)  PFSH: Family History: negative, No family history of colorectal cancer, breast cancer, ovarian cancer  Comments: No family history of colorectal cancer, breast cancer, or ovarian cancer.  Social History: positive alcohol, positive tobacco    Smoker: Refused smoking cessation counseling  Smoker- Packs Per Day: 1  Smoker- How Many Years: 34  Comments: Patient is a chronic smoker one pack per day for more than 40 years.  Also drinks beer.  Does not use any illegal decrease in her drug  Additional Past Medical and Surgical History: Hypertension, COPD, chronic tobacco abuse.      ADVANCED DIRECTIVES: Patient does not have any advanced healthcare directive. Information has been given.   HEALTH MAINTENANCE: History  Substance Use Topics  . Smoking status: Current Every Day Smoker -- 0.25 packs/day    Types: Cigarettes  . Smokeless tobacco: Not on file  . Alcohol Use: Not on file   :  No Known Allergies  Current Outpatient Prescriptions  Medication Sig Dispense Refill  . amLODipine (NORVASC) 5 MG tablet Take 1 tablet (5 mg total) by mouth daily. 30 tablet 6  . Fluticasone-Salmeterol (ADVAIR) 250-50 MCG/DOSE AEPB Inhale 1 puff into the lungs 2 (two) times daily as needed.    . hydrochlorothiazide (HYDRODIURIL) 12.5 MG tablet Take 1 tablet (12.5 mg total) by mouth daily. 30 tablet 6  . HYDROcodone-acetaminophen (NORCO/VICODIN) 5-325 MG per tablet Take 1 tablet by mouth every 6 (six) hours as needed for moderate pain. 60 tablet 0  . Ipratropium-Albuterol (COMBIVENT) 20-100 MCG/ACT AERS respimat Inhale 2 puffs into the lungs 2 (two) times daily as needed for wheezing.     No current facility-administered medications for this visit.    OBJECTIVE:  Filed Vitals:   10/16/14 1556  BP: 129/75  Pulse: 88  Temp: 96.8 F (36 C)  Resp: 18     Body mass index is 22.67 kg/(m^2).    ECOG FS:1 - Symptomatic but completely ambulatory  PHYSICAL EXAM: Gen. status: Patient is alert and oriented somewhat apprehensive HEENT: There is no evidence of active bleeding.  No jaundice.    lungs: Emphysematous chest.  No rhonchi or rales diminished air entry on both sides Cardiac exam revealed the PMI to be normally situated and sized. The  rhythm was regular and no extrasystoles were noted during several minutes of auscultation. The first and second heart sounds were normal and physiologic splitting of the second heart sound was noted. There were no murmurs, rubs, clicks, or gallops Abdominal exam revealed normal bowel sounds. The abdomen was soft, non-tender, and without masses, organomegaly, or appreciable enlargement of the abdominal aorta. Examination of the skin revealed no evidence of significant rashes, suspicious appearing nevi or other concerning lesions. Neurologically, the patient was awake, alert, and oriented to person, place and time. There were no obvious focal neurologic abnormalities.   LAB RESULTS:  No visits with results within 3 Day(s) from this visit. Latest known visit with results is:  Hospital Outpatient Visit on 10/12/2014  Component Date Value Ref Range Status  . Glucose-Capillary 10/12/2014 85  65 - 99 mg/dL Final    No results  found for: LABCA2 No results found for: CA199 Lab Results  Component Value Date   CEA 2.5 07/21/2011    STUDIES: Nm Pet Image Restag (ps) Skull Base To Thigh  10/12/2014   CLINICAL DATA:  Subsequent treatment strategy for malignant neoplasm of lung. Right upper lobe primary. Restaging. Hepatitis-C.  EXAM: NUCLEAR MEDICINE PET SKULL BASE TO THIGH  TECHNIQUE: 11.6 MCi F-18 FDG was injected intravenously. Full-ring PET imaging was performed from the skull base to thigh after the radiotracer. CT data was obtained and used for attenuation correction and anatomic localization.  FASTING BLOOD GLUCOSE:  Value: 85 mg/dl  COMPARISON:  05/29/2014  FINDINGS: NECK  No areas of abnormal hypermetabolism.  CHEST  Anterior right upper lobe/apical consolidation with air bronchograms again identified. This measures a S.U.V. max of 3.1 today. On the prior exam, measured a S.U.V. max of 2.8.  A right paratracheal node measures 9 mm and a S.U.V. max of 2.7 on image 81. On the prior exam, this  measured 8 mm and a S.U.V. max of 2.6.  Subcarinal nodal tissue is difficult to measure secondary ill-defined nature. Hypermetabolism measures a S.U.V. max of 3.0 on image 85. On the prior exam, this measured a S.U.V. max of 3.2.  ABDOMEN/PELVIS  No findings to suggest hypermetabolic metastatic disease within the abdomen or pelvis. Right pelvic hypermetabolism is in the vicinity of right adnexal calcifications. This measures a S.U.V. max of 9.5 today. On the prior exam, measured a S.U.V. max of 7.5.  SKELETON  No abnormal marrow activity.  CT IMAGES PERFORMED FOR ATTENUATION CORRECTION  No cervical adenopathy.  A left Port-A-Cath which terminates at the high right atrium. Normal heart size with advanced multivessel coronary artery atherosclerosis. Ill definition of fat planes within the mediastinum is similar and likely radiation induced. Centrilobular emphysema. Medial right lung base consolidation is not significantly hypermetabolic and unchanged, including back to 01/30/2014 CT.  Advanced hepatic steatosis. Left adrenal thickening. Suspect uterine fibroids with foci of uterine calcification and hyper attenuation.  Osteopenia.  Right anterior first rib fracture on image 71 is chronic. Adjacent to presumed radiation change.  IMPRESSION: 1. No significant change in low-level hypermetabolism corresponding to right apical consolidation, likely radiation induced. No well-defined residual or recurrent disease. 2. Similarly, mediastinal nodes demonstrate persistent low-level hypermetabolism and are favored to be reactive. 3. A right pelvic focus of hypermetabolism is persistent since the prior exam. Although this could be secondary to the right ureter, an ovarian or adnexal process is suspected. Considerations include a hypermetabolic exophytic fibroid or an ovarian lesion. This could either be re-evaluated on follow-up PET or more entirely characterized with pelvic ultrasound. 4. Age advanced atherosclerosis, including  within the coronary arteries. 5. Right anterior first rib fracture is favored to be radiation induced and was present on the prior exam. 6. Incidental findings, including hepatic steatosis, probable right lower lobe scarring, and uterine fibroids.   Electronically Signed   By: Abigail Miyamoto M.D.   On: 10/12/2014 16:47    ASSESSMENT: 59 year old lady with stage III persistent or recurrent lung cancer treated with pneumonia and a man.    epistaxis Hepatitis C Chronic alcohol and tobacco abuse  MEDICAL DECISION MAKING:  All lab data has been reviewed.    PET scan has been ordered and reviewed that he is very low level activity of hypermetabolic activity in the lung. There is some hyper metabolic activity in pelvic area which can be reevaluated with repeat PET scanning Epistaxes  Patient was referred  to ENT evaluation  Hepatitis C  Patient was advised to continue follow-up with gastroenterologist  Patient expressed understanding and was in agreement with this plan. She also understands that She can call clinic at any time with any questions, concerns, or complaints.    No matching staging information was found for the patient.  Forest Gleason, MD   10/17/2014 9:05 PM

## 2014-11-16 ENCOUNTER — Telehealth: Payer: Self-pay | Admitting: *Deleted

## 2014-11-16 MED ORDER — HYDROCODONE-ACETAMINOPHEN 5-325 MG PO TABS: 1.0000 | ORAL_TABLET | Freq: Four times a day (QID) | ORAL | Status: DC | PRN

## 2014-11-16 MED ORDER — POTASSIUM CHLORIDE 20 MEQ PO PACK: 20.0000 meq | PACK | Freq: Every day | ORAL | Status: DC

## 2014-11-16 MED ORDER — POTASSIUM CHLORIDE CRYS ER 20 MEQ PO TBCR
20.0000 meq | EXTENDED_RELEASE_TABLET | Freq: Every day | ORAL | Status: DC
Start: 2014-11-16 — End: 2015-04-17

## 2014-11-16 NOTE — Telephone Encounter (Signed)
Escribed

## 2014-11-27 ENCOUNTER — Inpatient Hospital Stay: Payer: Medicare Other | Attending: Oncology

## 2014-11-27 DIAGNOSIS — C3490 Malignant neoplasm of unspecified part of unspecified bronchus or lung: Secondary | ICD-10-CM | POA: Diagnosis not present

## 2014-11-27 DIAGNOSIS — Z452 Encounter for adjustment and management of vascular access device: Secondary | ICD-10-CM | POA: Diagnosis not present

## 2014-11-27 DIAGNOSIS — C801 Malignant (primary) neoplasm, unspecified: Secondary | ICD-10-CM

## 2014-11-27 MED ORDER — HYDROCODONE-ACETAMINOPHEN 5-325 MG PO TABS: 1.0000 | ORAL_TABLET | Freq: Four times a day (QID) | ORAL | Status: DC | PRN

## 2014-11-27 MED ORDER — SODIUM CHLORIDE 0.9 % IJ SOLN
10.0000 mL | Freq: Once | INTRAMUSCULAR | Status: AC
  Administered 2014-11-27: 10 mL via INTRAVENOUS
  Filled 2014-11-27: qty 10

## 2014-11-27 MED ORDER — HEPARIN SOD (PORK) LOCK FLUSH 100 UNIT/ML IV SOLN
500.0000 [IU] | Freq: Once | INTRAVENOUS | Status: AC
  Administered 2014-11-27: 500 [IU] via INTRAVENOUS
  Filled 2014-11-27: qty 5

## 2014-12-24 ENCOUNTER — Telehealth: Payer: Self-pay | Admitting: *Deleted

## 2014-12-24 DIAGNOSIS — C801 Malignant (primary) neoplasm, unspecified: Secondary | ICD-10-CM

## 2014-12-24 DIAGNOSIS — C3490 Malignant neoplasm of unspecified part of unspecified bronchus or lung: Secondary | ICD-10-CM

## 2014-12-24 MED ORDER — HYDROCODONE-ACETAMINOPHEN 5-325 MG PO TABS: 1.0000 | ORAL_TABLET | Freq: Four times a day (QID) | ORAL | Status: DC | PRN

## 2014-12-24 NOTE — Telephone Encounter (Signed)
Informed that prescription is ready to pick up  

## 2015-01-11 ENCOUNTER — Other Ambulatory Visit: Payer: Self-pay | Admitting: *Deleted

## 2015-01-11 DIAGNOSIS — C3490 Malignant neoplasm of unspecified part of unspecified bronchus or lung: Secondary | ICD-10-CM

## 2015-01-15 ENCOUNTER — Inpatient Hospital Stay: Payer: Medicare Other | Attending: Oncology

## 2015-01-15 ENCOUNTER — Encounter: Payer: Self-pay | Admitting: Oncology

## 2015-01-15 ENCOUNTER — Inpatient Hospital Stay (HOSPITAL_BASED_OUTPATIENT_CLINIC_OR_DEPARTMENT_OTHER): Payer: Medicare Other | Admitting: Oncology

## 2015-01-15 ENCOUNTER — Inpatient Hospital Stay: Payer: Medicare Other

## 2015-01-15 VITALS — BP 143/84 | HR 82 | Temp 97.0°F | Wt 125.2 lb

## 2015-01-15 DIAGNOSIS — B182 Chronic viral hepatitis C: Secondary | ICD-10-CM | POA: Insufficient documentation

## 2015-01-15 DIAGNOSIS — Z79899 Other long term (current) drug therapy: Secondary | ICD-10-CM

## 2015-01-15 DIAGNOSIS — R040 Epistaxis: Secondary | ICD-10-CM | POA: Diagnosis not present

## 2015-01-15 DIAGNOSIS — F1010 Alcohol abuse, uncomplicated: Secondary | ICD-10-CM | POA: Diagnosis not present

## 2015-01-15 DIAGNOSIS — C801 Malignant (primary) neoplasm, unspecified: Secondary | ICD-10-CM

## 2015-01-15 DIAGNOSIS — C3490 Malignant neoplasm of unspecified part of unspecified bronchus or lung: Secondary | ICD-10-CM | POA: Diagnosis not present

## 2015-01-15 DIAGNOSIS — F17210 Nicotine dependence, cigarettes, uncomplicated: Secondary | ICD-10-CM

## 2015-01-15 LAB — CBC WITH DIFFERENTIAL/PLATELET
Basophils Absolute: 0 10*3/uL (ref 0–0.1)
Basophils Relative: 1 %
EOS PCT: 2 %
Eosinophils Absolute: 0.1 10*3/uL (ref 0–0.7)
HEMATOCRIT: 29.5 % — AB (ref 35.0–47.0)
Hemoglobin: 9.9 g/dL — ABNORMAL LOW (ref 12.0–16.0)
LYMPHS ABS: 1.2 10*3/uL (ref 1.0–3.6)
LYMPHS PCT: 28 %
MCH: 27.9 pg (ref 26.0–34.0)
MCHC: 33.7 g/dL (ref 32.0–36.0)
MCV: 82.8 fL (ref 80.0–100.0)
MONO ABS: 0.5 10*3/uL (ref 0.2–0.9)
MONOS PCT: 12 %
NEUTROS ABS: 2.5 10*3/uL (ref 1.4–6.5)
Neutrophils Relative %: 57 %
PLATELETS: 224 10*3/uL (ref 150–440)
RBC: 3.56 MIL/uL — ABNORMAL LOW (ref 3.80–5.20)
RDW: 15.9 % — AB (ref 11.5–14.5)
WBC: 4.4 10*3/uL (ref 3.6–11.0)

## 2015-01-15 LAB — COMPREHENSIVE METABOLIC PANEL
ALT: 49 U/L (ref 14–54)
ANION GAP: 6 (ref 5–15)
AST: 79 U/L — ABNORMAL HIGH (ref 15–41)
Albumin: 3.2 g/dL — ABNORMAL LOW (ref 3.5–5.0)
Alkaline Phosphatase: 159 U/L — ABNORMAL HIGH (ref 38–126)
BILIRUBIN TOTAL: 0.5 mg/dL (ref 0.3–1.2)
BUN: 13 mg/dL (ref 6–20)
CO2: 22 mmol/L (ref 22–32)
Calcium: 8 mg/dL — ABNORMAL LOW (ref 8.9–10.3)
Chloride: 107 mmol/L (ref 101–111)
Creatinine, Ser: 0.74 mg/dL (ref 0.44–1.00)
GFR calc Af Amer: >60 mL/min (ref >60)
GFR calc non Af Amer: >60 mL/min (ref >60)
Glucose, Bld: 100 mg/dL — ABNORMAL HIGH (ref 65–99)
POTASSIUM: 3.5 mmol/L (ref 3.5–5.1)
SODIUM: 135 mmol/L (ref 135–145)
Total Protein: 8.3 g/dL — ABNORMAL HIGH (ref 6.5–8.1)

## 2015-01-15 MED ORDER — HEPARIN SOD (PORK) LOCK FLUSH 100 UNIT/ML IV SOLN
INTRAVENOUS | Status: AC
  Filled 2015-01-15: qty 5

## 2015-01-15 MED ORDER — SODIUM CHLORIDE 0.9 % IJ SOLN
10.0000 mL | INTRAMUSCULAR | Status: DC | PRN
  Administered 2015-01-15: 10 mL via INTRAVENOUS
  Filled 2015-01-15: qty 10

## 2015-01-15 MED ORDER — HEPARIN SOD (PORK) LOCK FLUSH 100 UNIT/ML IV SOLN
500.0000 [IU] | Freq: Once | INTRAVENOUS | Status: AC
  Administered 2015-01-15: 500 [IU] via INTRAVENOUS

## 2015-01-15 MED ORDER — HYDROCODONE-ACETAMINOPHEN 5-325 MG PO TABS: 1.0000 | ORAL_TABLET | Freq: Four times a day (QID) | ORAL | Status: DC | PRN

## 2015-01-15 NOTE — Progress Notes (Signed)
Patient does not have living will.  Currently smokes.  Patient complains of back pain especially at night.  Requesting refill on Norco.

## 2015-01-20 ENCOUNTER — Encounter: Payer: Self-pay | Admitting: Oncology

## 2015-01-20 NOTE — Progress Notes (Signed)
[facility] [phone]  [phone]     Jeanette Jones OB: 08/23/1955  MR#: [id number]  CSN#:[id number]  Patient Care Team: No Pcp Per Patient as PCP - General (General Practice)  CHIEF COMPLAINT:  Chief Complaint  Patient presents with  . Follow-up    Chief Complaint/Diagnosis:   1. 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 Oakes, cardiothoracic surgeon) 2. Started on radiation and concurrent chemotherapy with carboplatinum Taxol from April of 2013 3. Finished radiation and chemotherapy with carboplatinum and Taxol in May of 2013 4. Stage IIIB (T4N3M0) NSCLC who completed a course of EBRT on Oct 07, 2011 with a total dose of 7000cGy.   5. Starting consolidation with 2 cycles of carboplatinum and Abraxane 6.patient is off chemotherapy since November of 2013.  Repeat CT scan shows stable disease(January, 2014) 7  Started been on Taxotere and  RAMICIRUMAB from June 07, 2013 8.chemotherapy with Taxotere  and  RAMUCIRUMAB discontinued in March of 2015 because of hemoptysis 9.Started onNIVOLULAMAB April of 2015. 10NIVOLULAMABWas put on hold because of abnormal liver enzymes January of 2016       No flowsheet data found.  INTERVAL HISTORY: 59-year-old lady with history of carcinoma lung.  Squamous cell carcinoma stage IIIA disease.  The patient is here as an add-on.  Patient did not go for routine CT scan which had been scheduled.  Patient complains of epistaxis.  Also has hepatitis C for which patient has not taken any treatment.  Continues to drink and smoke Oct 16, 2014 Patient is here for ongoing evaluation regarding carcinoma of lung  Patient had a PET scan done. Patient has epistaxis which is resolved but going to see ENT physician Here to discuss the result of the PET scan and further planning of treatment  January 15, 2015 Patient is here for  ongoing evaluation regarding carcinoma of lung.  Appetite has improved.  No nausea.  No vomiting.  No diarrhea. Patient still not getting any treatment for hepatitis C as patient was told that she has to be off drinking and smoking for at least 3 months prior to starting treatment. Since continues to drink and smoke.  Complains of back pain and taking Vicodin off and on REVIEW OF SYSTEMS:   Gen. status: Feeling weak and tired.  HEENT : Complains of epistaxis off and on since been evaluated by ENT surgeon Lungs: Dry hacking cough.  No hemoptysis.   Cardiovascular system: No chest pain.  No palpitation.  No paroxysmal nocturnal dyspnea. Gastro intestinal system: No heartburn.  No nausea or vomiting.  No abdominal pain.  No diarrhea.  No constipation.  No rectal bleeding. Skin: No evidence of ecchymosis or rash. Neurological system: No dizziness.  No tingling.  His was.  no tingling numbness.  no focal weakness or any focal signs. Lower extreme no edema       As per HPI. Otherwise, a complete review of systems is negatve.  PAST MEDICAL HISTORY: Past Medical History  Diagnosis Date  . Hep C w/o coma, chronic   . Back pain, chronic   . Swallowing difficulty   . Emphysema of lung   . Heart murmur   . Lung cancer   . Depression   . Carcinoma of lung 10/12/2014    PAST SURGICAL HISTORY: Past Surgical History  Procedure Laterality Date  . Lung biopsy    . Tubal ligation    .   Hemorroidectomy      FAMILY HISTORY  No Known Allergies:   Significant History/PMH:   Hepatitis C:    Back Pain, Chronic:    Swallowing Difficulty:    Gastric Reflux:    Heart Murmur:    Lung Cancer:    HTN:    Depression:    copd:    Lung Biopsy:    Tubal Ligation:    Hemorrhoidectomy:   Preventive Screening:  Has patient had any of the following test? Mammography  Pap Smear (1)   Last Mammography: 2011(1)   Last Pap Smear: 10 yrs ago?(1)   Smoking History: Smoking History  1(1)Packs per day(1)Smoking Cessation Information Given to Patient (1)  PFSH: Family History: negative, No family history of colorectal cancer, breast cancer, ovarian cancer  Comments: No family history of colorectal cancer, breast cancer, or ovarian cancer.  Social History: positive alcohol, positive tobacco  Smoker: Refused smoking cessation counseling  Smoker- Packs Per Day: 1  Smoker- How Many Years: 72  Comments: Patient is a chronic smoker one pack per day for more than 40 years.  Also drinks beer.  Does not use any illegal decrease in her drug  Additional Past Medical and Surgical History: Hypertension, COPD, chronic tobacco abuse.      ADVANCED DIRECTIVES: Patient does not have any advanced healthcare directive. Information has been given.   HEALTH MAINTENANCE: Social History  Substance Use Topics  . Smoking status: Current Every Day Smoker -- 0.25 packs/day    Types: Cigarettes  . Smokeless tobacco: None  . Alcohol Use: None   :  No Known Allergies  Current Outpatient Prescriptions  Medication Sig Dispense Refill  . amLODipine (NORVASC) 5 MG tablet Take 1 tablet (5 mg total) by mouth daily. 30 tablet 6  . Fluticasone-Salmeterol (ADVAIR) 250-50 MCG/DOSE AEPB Inhale 1 puff into the lungs 2 (two) times daily as needed.    . hydrochlorothiazide (HYDRODIURIL) 12.5 MG tablet Take 1 tablet (12.5 mg total) by mouth daily. 30 tablet 6  . HYDROcodone-acetaminophen (NORCO/VICODIN) 5-325 MG per tablet Take 1 tablet by mouth every 6 (six) hours as needed for moderate pain. 30 tablet 0  . Ipratropium-Albuterol (COMBIVENT) 20-100 MCG/ACT AERS respimat Inhale 2 puffs into the lungs 2 (two) times daily as needed for wheezing.    Marland Kitchen omeprazole (PRILOSEC) 40 MG capsule Take 40 mg by mouth daily.    . potassium chloride (KLOR-CON) 20 MEQ packet Take 20 mEq by mouth daily. 30 packet 1  . potassium chloride SA (K-DUR,KLOR-CON) 20 MEQ tablet Take 1 tablet (20 mEq total) by mouth daily. 30  tablet 1   No current facility-administered medications for this visit.    OBJECTIVE:  Filed Vitals:   01/15/15 1451  BP: 143/84  Pulse: 82  Temp: 97 F (36.1 C)     Body mass index is 22.9 kg/(m^2).    ECOG FS:1 - Symptomatic but completely ambulatory  PHYSICAL EXAM: Gen. status: Patient is alert and oriented somewhat apprehensive HEENT: There is no evidence of active bleeding.  No jaundice.    lungs: Emphysematous chest.  No rhonchi or rales diminished air entry on both sides Cardiac exam revealed the PMI to be normally situated and sized. The rhythm was regular and no extrasystoles were noted during several minutes of auscultation. The first and second heart sounds were normal and physiologic splitting of the second heart sound was noted. There were no murmurs, rubs, clicks, or gallops Abdominal exam revealed normal bowel sounds. The abdomen was soft,  non-tender, and without masses, organomegaly, or appreciable enlargement of the abdominal aorta. Examination of the skin revealed no evidence of significant rashes, suspicious appearing nevi or other concerning lesions. Neurologically, the patient was awake, alert, and oriented to person, place and time. There were no obvious focal neurologic abnormalities.   LAB RESULTS:  Infusion on 01/15/2015  Component Date Value Ref Range Status  . Sodium 01/15/2015 135  135 - 145 mmol/L Final  . Potassium 01/15/2015 3.5  3.5 - 5.1 mmol/L Final  . Chloride 01/15/2015 107  101 - 111 mmol/L Final  . CO2 01/15/2015 22  22 - 32 mmol/L Final  . Glucose, Bld 01/15/2015 100* 65 - 99 mg/dL Final  . BUN 01/15/2015 13  6 - 20 mg/dL Final  . Creatinine, Ser 01/15/2015 0.74  0.44 - 1.00 mg/dL Final  . Calcium 01/15/2015 8.0* 8.9 - 10.3 mg/dL Final  . Total Protein 01/15/2015 8.3* 6.5 - 8.1 g/dL Final  . Albumin 01/15/2015 3.2* 3.5 - 5.0 g/dL Final  . AST 01/15/2015 79* 15 - 41 U/L Final  . ALT 01/15/2015 49  14 - 54 U/L Final  . Alkaline Phosphatase  01/15/2015 159* 38 - 126 U/L Final  . Total Bilirubin 01/15/2015 0.5  0.3 - 1.2 mg/dL Final  . GFR calc non Af Amer 01/15/2015 >60  >60 mL/min Final  . GFR calc Af Amer 01/15/2015 >60  >60 mL/min Final   Comment: (NOTE) The eGFR has been calculated using the CKD EPI equation. This calculation has not been validated in all clinical situations. eGFR's persistently <60 mL/min signify possible Chronic Kidney Disease.   . Anion gap 01/15/2015 6  5 - 15 Final  . WBC 01/15/2015 4.4  3.6 - 11.0 K/uL Final   A-LINE DRAW  . RBC 01/15/2015 3.56* 3.80 - 5.20 MIL/uL Final  . Hemoglobin 01/15/2015 9.9* 12.0 - 16.0 g/dL Final  . HCT 01/15/2015 29.5* 35.0 - 47.0 % Final  . MCV 01/15/2015 82.8  80.0 - 100.0 fL Final  . MCH 01/15/2015 27.9  26.0 - 34.0 pg Final  . MCHC 01/15/2015 33.7  32.0 - 36.0 g/dL Final  . RDW 01/15/2015 15.9* 11.5 - 14.5 % Final  . Platelets 01/15/2015 224  150 - 440 K/uL Final  . Neutrophils Relative % 01/15/2015 57   Final  . Neutro Abs 01/15/2015 2.5  1.4 - 6.5 K/uL Final  . Lymphocytes Relative 01/15/2015 28   Final  . Lymphs Abs 01/15/2015 1.2  1.0 - 3.6 K/uL Final  . Monocytes Relative 01/15/2015 12   Final  . Monocytes Absolute 01/15/2015 0.5  0.2 - 0.9 K/uL Final  . Eosinophils Relative 01/15/2015 2   Final  . Eosinophils Absolute 01/15/2015 0.1  0 - 0.7 K/uL Final  . Basophils Relative 01/15/2015 1   Final  . Basophils Absolute 01/15/2015 0.0  0 - 0.1 K/uL Final    No results found for: LABCA2 No results found for: CA199 Lab Results  Component Value Date   CEA 2.5 07/21/2011    STUDIES: No results found.  ASSESSMENT: 59 year old lady with stage III persistent or recurrent lung cancer treated with pneumonia and a man.    epistaxis Hepatitis C Chronic alcohol and tobacco abuse  MEDICAL DECISION MAKING:  All lab data has been reviewed.    Patient was referred to ENT evaluation  Hepatitis C  Patient was advised to continue follow-up with  gastroenterologist  Patient expressed understanding and was in agreement with this plan. She also understands  that She can call clinic at any time with any questions, concerns, or complaints.   There is no clinical evidence of recurrent disease. Abnormal PET scan will be followed.  Patient continues to smoke and drink Exact etiology of leg pain that C complains 4 BC takes hydrocodone is not clear.  The patient was advised to get a clinic appointment for evaluation Will check urine for Screening next visit liverenzyme continues to be abnormal  No matching staging information was found for the patient.   Forest Gleason, MD   01/20/2015 10:10 AM

## 2015-02-06 ENCOUNTER — Other Ambulatory Visit: Payer: Self-pay | Admitting: *Deleted

## 2015-02-06 DIAGNOSIS — C3490 Malignant neoplasm of unspecified part of unspecified bronchus or lung: Secondary | ICD-10-CM

## 2015-02-06 DIAGNOSIS — C801 Malignant (primary) neoplasm, unspecified: Secondary | ICD-10-CM

## 2015-02-06 MED ORDER — HYDROCODONE-ACETAMINOPHEN 5-325 MG PO TABS: 1.0000 | ORAL_TABLET | Freq: Four times a day (QID) | ORAL | Status: DC | PRN

## 2015-02-06 NOTE — Telephone Encounter (Signed)
Informed that prescription is ready to pick up  

## 2015-03-25 ENCOUNTER — Other Ambulatory Visit: Payer: Self-pay | Admitting: Physician Assistant

## 2015-03-25 DIAGNOSIS — Z0000 Encounter for general adult medical examination without abnormal findings: Secondary | ICD-10-CM

## 2015-03-26 ENCOUNTER — Telehealth: Payer: Self-pay | Admitting: *Deleted

## 2015-03-26 DIAGNOSIS — C801 Malignant (primary) neoplasm, unspecified: Secondary | ICD-10-CM

## 2015-03-26 DIAGNOSIS — C3490 Malignant neoplasm of unspecified part of unspecified bronchus or lung: Secondary | ICD-10-CM

## 2015-03-26 MED ORDER — HYDROCODONE-ACETAMINOPHEN 5-325 MG PO TABS: 1.0000 | ORAL_TABLET | Freq: Four times a day (QID) | ORAL | Status: DC | PRN

## 2015-03-26 NOTE — Telephone Encounter (Signed)
Informed that prescription is ready to pick up  

## 2015-03-27 ENCOUNTER — Other Ambulatory Visit: Payer: Self-pay | Admitting: Physician Assistant

## 2015-03-27 DIAGNOSIS — Z0000 Encounter for general adult medical examination without abnormal findings: Secondary | ICD-10-CM

## 2015-03-27 DIAGNOSIS — Z1231 Encounter for screening mammogram for malignant neoplasm of breast: Secondary | ICD-10-CM

## 2015-04-04 ENCOUNTER — Ambulatory Visit
Admission: RE | Admit: 2015-04-04 | Discharge: 2015-04-04 | Disposition: A | Discharge status: Home / Self Care | Payer: Medicare Other | Source: Ambulatory Visit | Attending: Physician Assistant | Admitting: Physician Assistant

## 2015-04-04 ENCOUNTER — Other Ambulatory Visit: Payer: Self-pay | Admitting: Physician Assistant

## 2015-04-04 DIAGNOSIS — Z0000 Encounter for general adult medical examination without abnormal findings: Secondary | ICD-10-CM

## 2015-04-04 DIAGNOSIS — Z1231 Encounter for screening mammogram for malignant neoplasm of breast: Secondary | ICD-10-CM | POA: Diagnosis present

## 2015-04-15 ENCOUNTER — Ambulatory Visit
Admission: RE | Admit: 2015-04-15 | Discharge: 2015-04-15 | Disposition: A | Discharge status: Home / Self Care | Payer: Medicare Other | Source: Ambulatory Visit | Attending: Oncology | Admitting: Oncology

## 2015-04-15 DIAGNOSIS — I2510 Atherosclerotic heart disease of native coronary artery without angina pectoris: Secondary | ICD-10-CM | POA: Insufficient documentation

## 2015-04-15 DIAGNOSIS — K760 Fatty (change of) liver, not elsewhere classified: Secondary | ICD-10-CM | POA: Insufficient documentation

## 2015-04-15 DIAGNOSIS — R590 Localized enlarged lymph nodes: Secondary | ICD-10-CM | POA: Diagnosis not present

## 2015-04-15 DIAGNOSIS — C3490 Malignant neoplasm of unspecified part of unspecified bronchus or lung: Secondary | ICD-10-CM | POA: Insufficient documentation

## 2015-04-15 DIAGNOSIS — C801 Malignant (primary) neoplasm, unspecified: Secondary | ICD-10-CM

## 2015-04-15 DIAGNOSIS — I7090 Unspecified atherosclerosis: Secondary | ICD-10-CM | POA: Insufficient documentation

## 2015-04-15 LAB — GLUCOSE, CAPILLARY: Glucose-Capillary: 85 mg/dL (ref 65–99)

## 2015-04-15 MED ORDER — FLUDEOXYGLUCOSE F - 18 (FDG) INJECTION
12.3400 | Freq: Once | INTRAVENOUS | Status: AC | PRN
  Administered 2015-04-15: 12.34 via INTRAVENOUS

## 2015-04-17 ENCOUNTER — Inpatient Hospital Stay (HOSPITAL_BASED_OUTPATIENT_CLINIC_OR_DEPARTMENT_OTHER): Payer: Medicare Other | Admitting: Oncology

## 2015-04-17 ENCOUNTER — Inpatient Hospital Stay: Payer: Medicare Other

## 2015-04-17 ENCOUNTER — Inpatient Hospital Stay: Payer: Medicare Other | Attending: Oncology

## 2015-04-17 ENCOUNTER — Encounter: Payer: Self-pay | Admitting: Oncology

## 2015-04-17 VITALS — BP 141/92 | HR 100 | Temp 96.8°F | Wt 119.0 lb

## 2015-04-17 DIAGNOSIS — F17210 Nicotine dependence, cigarettes, uncomplicated: Secondary | ICD-10-CM

## 2015-04-17 DIAGNOSIS — M2550 Pain in unspecified joint: Secondary | ICD-10-CM | POA: Insufficient documentation

## 2015-04-17 DIAGNOSIS — M1990 Unspecified osteoarthritis, unspecified site: Secondary | ICD-10-CM | POA: Insufficient documentation

## 2015-04-17 DIAGNOSIS — C801 Malignant (primary) neoplasm, unspecified: Secondary | ICD-10-CM

## 2015-04-17 DIAGNOSIS — B1920 Unspecified viral hepatitis C without hepatic coma: Secondary | ICD-10-CM | POA: Insufficient documentation

## 2015-04-17 DIAGNOSIS — C3490 Malignant neoplasm of unspecified part of unspecified bronchus or lung: Secondary | ICD-10-CM

## 2015-04-17 DIAGNOSIS — J439 Emphysema, unspecified: Secondary | ICD-10-CM | POA: Diagnosis not present

## 2015-04-17 DIAGNOSIS — Z79899 Other long term (current) drug therapy: Secondary | ICD-10-CM

## 2015-04-17 DIAGNOSIS — M25511 Pain in right shoulder: Secondary | ICD-10-CM

## 2015-04-17 DIAGNOSIS — M25561 Pain in right knee: Secondary | ICD-10-CM | POA: Insufficient documentation

## 2015-04-17 DIAGNOSIS — F329 Major depressive disorder, single episode, unspecified: Secondary | ICD-10-CM | POA: Insufficient documentation

## 2015-04-17 DIAGNOSIS — F1010 Alcohol abuse, uncomplicated: Secondary | ICD-10-CM | POA: Insufficient documentation

## 2015-04-17 LAB — CBC WITH DIFFERENTIAL/PLATELET
BASOS PCT: 1 %
Basophils Absolute: 0 10*3/uL (ref 0–0.1)
Eosinophils Absolute: 0.1 10*3/uL (ref 0–0.7)
Eosinophils Relative: 2 %
HEMATOCRIT: 32.9 % — AB (ref 35.0–47.0)
HEMOGLOBIN: 10.8 g/dL — AB (ref 12.0–16.0)
Lymphocytes Relative: 36 %
Lymphs Abs: 1.4 10*3/uL (ref 1.0–3.6)
MCH: 27.7 pg (ref 26.0–34.0)
MCHC: 32.9 g/dL (ref 32.0–36.0)
MCV: 84.4 fL (ref 80.0–100.0)
Monocytes Absolute: 0.4 10*3/uL (ref 0.2–0.9)
Monocytes Relative: 12 %
NEUTROS ABS: 1.9 10*3/uL (ref 1.4–6.5)
NEUTROS PCT: 49 %
Platelets: 244 10*3/uL (ref 150–440)
RBC: 3.9 MIL/uL (ref 3.80–5.20)
RDW: 17.2 % — ABNORMAL HIGH (ref 11.5–14.5)
WBC: 3.8 10*3/uL (ref 3.6–11.0)

## 2015-04-17 LAB — COMPREHENSIVE METABOLIC PANEL
ALBUMIN: 3.3 g/dL — AB (ref 3.5–5.0)
ALK PHOS: 178 U/L — AB (ref 38–126)
ALT: 50 U/L (ref 14–54)
AST: 161 U/L — ABNORMAL HIGH (ref 15–41)
Anion gap: 7 (ref 5–15)
BILIRUBIN TOTAL: 0.4 mg/dL (ref 0.3–1.2)
BUN: 9 mg/dL (ref 6–20)
CHLORIDE: 106 mmol/L (ref 101–111)
CO2: 21 mmol/L — ABNORMAL LOW (ref 22–32)
CREATININE: 0.69 mg/dL (ref 0.44–1.00)
Calcium: 8.5 mg/dL — ABNORMAL LOW (ref 8.9–10.3)
GFR calc non Af Amer: >60 mL/min (ref >60)
Glucose, Bld: 92 mg/dL (ref 65–99)
Potassium: 3.5 mmol/L (ref 3.5–5.1)
Sodium: 134 mmol/L — ABNORMAL LOW (ref 135–145)
Total Protein: 8.5 g/dL — ABNORMAL HIGH (ref 6.5–8.1)

## 2015-04-17 MED ORDER — HYDROCODONE-ACETAMINOPHEN 5-325 MG PO TABS: 1.0000 | ORAL_TABLET | Freq: Four times a day (QID) | ORAL | Status: DC | PRN

## 2015-04-17 NOTE — Progress Notes (Signed)
Patient requesting refill for Hydrocodone.  Patient here today for CT results.  States she is having nosebleeds.  Has seen ENT for this.  Also c/o no appetite.

## 2015-04-17 NOTE — Progress Notes (Signed)
[place]  [phone]  [phone]     Jeanette Jones DOB: 12-01-1955  MR#: [id number]  BUL#:[id number]  Patient Care Team: Pcp Not In System as PCP - General  CHIEF COMPLAINT:  Chief Complaint  Patient presents with  . Lung Cancer   Patient recently underwent PET scan for restaging.  INTERVAL HISTORY:  Patient is here for continued evaluation regarding squamous cell carcinoma of the lung, stage IIIa disease. Patient recently had a PET scan for restaging that has been reviewed by myself as well as Dr. Oliva Bustard. Patient reports that she continues to have some intermittent epistaxis, she is seeing ENT for this and reports that they have cauterized her right nostril broken blood vessels. She continues to smoke cigarettes daily as well as drink alcohol daily. She is therefore not started treatment for hepatitis C. She also reports having increasing pain related to arthritis. She continues to use hydrocodone for her lower back, right shoulder and right knee pain.  REVIEW OF SYSTEMS:   Review of Systems  Constitutional: Negative for fever, chills and malaise/fatigue.  HENT: Positive for nosebleeds. Negative for congestion and sore throat.        Seeing ENT, ruptured blood vessels have been cauterized.  Eyes: Negative for blurred vision and double vision.  Respiratory: Positive for cough and shortness of breath. Negative for hemoptysis, sputum production, wheezing and stridor.   Cardiovascular: Negative for chest pain, palpitations, orthopnea, claudication, leg swelling and PND.  Gastrointestinal: Negative for heartburn, nausea, vomiting, abdominal pain, diarrhea, constipation, blood in stool and melena.  Genitourinary: Negative.   Musculoskeletal: Positive for back pain and joint pain.  Skin: Negative.   Neurological: Negative for dizziness, tingling, focal weakness, seizures and weakness.  Endo/Heme/Allergies: Does not bruise/bleed easily.    Psychiatric/Behavioral: Negative for depression. The patient is not nervous/anxious and does not have insomnia.     As per HPI. Otherwise, a complete review of systems is negatve.  ONCOLOGY HISTORY: Oncology History   Chief Complaint/Diagnosis:   1. 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 Faith Rogue, cardiothoracic surgeon) 2. Started on radiation and concurrent chemotherapy with carboplatinum Taxol from April of 2013 3. Finished radiation and chemotherapy with carboplatinum and Taxol in May of 2013 4. Stage IIIB (T4N3M0) NSCLC who completed a course of EBRT on Oct 07, 2011 with a total dose of 7000cGy.   5. Starting consolidation with 2 cycles of carboplatinum and Abraxane 6.patient is off chemotherapy since November of 2013.  Repeat CT scan shows stable disease(January, 2014) 7  Started been on Taxotere and  RAMICIRUMAB from June 07, 2013 8.chemotherapy with Taxotere  and  RAMUCIRUMAB discontinued in March of 2015 because of hemoptysis 9.Started [facility] April of 2015. 10NIVOLULAMABWas put on hold because of abnormal liver enzymes January of 2016     Carcinoma of lung [facility])   10/12/2014 Initial Diagnosis Carcinoma of lung    PAST MEDICAL HISTORY: Past Medical History  Diagnosis Date  . Hep C w/o coma, chronic ([place])   . Back pain, chronic   . Swallowing difficulty   . Emphysema of lung ([place])   . Heart murmur   . Depression   . Lung cancer ([place])   . Carcinoma of lung ([place]) 10/12/2014    PAST SURGICAL HISTORY: Past Surgical History  Procedure Laterality Date  . Lung biopsy    . Tubal ligation    . Hemorroidectomy  FAMILY HISTORY Family History  Problem Relation Age of Onset  . Breast cancer Neg Hx     GYNECOLOGIC HISTORY:  No LMP recorded. Patient is postmenopausal.     ADVANCED DIRECTIVES:    HEALTH MAINTENANCE: Social History  Substance Use Topics   . Smoking status: Current Every Day Smoker -- 0.25 packs/day    Types: Cigarettes  . Smokeless tobacco: Not on file  . Alcohol Use: Not on file     Colonoscopy:  PAP:  Bone density:  Lipid panel:  No Known Allergies  Current Outpatient Prescriptions  Medication Sig Dispense Refill  . Fluticasone-Salmeterol (ADVAIR) 250-50 MCG/DOSE AEPB Inhale 1 puff into the lungs 2 (two) times daily as needed.    Marland Kitchen HYDROcodone-acetaminophen (NORCO/VICODIN) 5-325 MG tablet Take 1 tablet by mouth every 6 (six) hours as needed for moderate pain. 30 tablet 0  . Ipratropium-Albuterol (COMBIVENT) 20-100 MCG/ACT AERS respimat Inhale 2 puffs into the lungs 2 (two) times daily as needed for wheezing.    Marland Kitchen omeprazole (PRILOSEC) 40 MG capsule Take 40 mg by mouth daily.    . potassium chloride (KLOR-CON) 20 MEQ packet Take 20 mEq by mouth daily. 30 packet 1   No current facility-administered medications for this visit.    OBJECTIVE: BP 141/92 mmHg  Pulse 100  Temp(Src) 96.8 F (36 C) (Tympanic)  Wt 119 lb 0.8 oz (54 kg)   Body mass index is 21.77 kg/(m^2).    ECOG FS:1 - Symptomatic but completely ambulatory  General: Well-developed, well-nourished, no acute distress. Eyes: Pink conjunctiva, anicteric sclera. HEENT: Normocephalic, moist mucous membranes, clear oropharnyx. Lungs: Diminished air entry bilaterally otherwise clear to auscultation bilaterally. Heart: Regular rate and rhythm. No rubs, murmurs, or gallops. Abdomen: Soft, nontender, nondistended. No organomegaly noted, normoactive bowel sounds. Musculoskeletal: Continues with chronic lower back pain, right knee, and right shoulder pain.  Neuro: Alert, answering all questions appropriately. Cranial nerves grossly intact. Skin: No rashes or petechiae noted. Psych: Normal affect. Lymphatics: No cervical, calvicular, axillary or inguinal LAD.   LAB RESULTS:  Appointment on 04/17/2015  Component Date Value Ref Range Status  . WBC 04/17/2015  3.8  3.6 - 11.0 K/uL Final  . RBC 04/17/2015 3.90  3.80 - 5.20 MIL/uL Final  . Hemoglobin 04/17/2015 10.8* 12.0 - 16.0 g/dL Final  . HCT 04/17/2015 32.9* 35.0 - 47.0 % Final  . MCV 04/17/2015 84.4  80.0 - 100.0 fL Final  . MCH 04/17/2015 27.7  26.0 - 34.0 pg Final  . MCHC 04/17/2015 32.9  32.0 - 36.0 g/dL Final  . RDW 04/17/2015 17.2* 11.5 - 14.5 % Final  . Platelets 04/17/2015 244  150 - 440 K/uL Final  . Neutrophils Relative % 04/17/2015 49   Final  . Neutro Abs 04/17/2015 1.9  1.4 - 6.5 K/uL Final  . Lymphocytes Relative 04/17/2015 36   Final  . Lymphs Abs 04/17/2015 1.4  1.0 - 3.6 K/uL Final  . Monocytes Relative 04/17/2015 12   Final  . Monocytes Absolute 04/17/2015 0.4  0.2 - 0.9 K/uL Final  . Eosinophils Relative 04/17/2015 2   Final  . Eosinophils Absolute 04/17/2015 0.1  0 - 0.7 K/uL Final  . Basophils Relative 04/17/2015 1   Final  . Basophils Absolute 04/17/2015 0.0  0 - 0.1 K/uL Final  . Sodium 04/17/2015 134* 135 - 145 mmol/L Final  . Potassium 04/17/2015 3.5  3.5 - 5.1 mmol/L Final  . Chloride 04/17/2015 106  101 - 111 mmol/L Final  . CO2 04/17/2015  21* 22 - 32 mmol/L Final  . Glucose, Bld 04/17/2015 92  65 - 99 mg/dL Final  . BUN 04/17/2015 9  6 - 20 mg/dL Final  . Creatinine, Ser 04/17/2015 0.69  0.44 - 1.00 mg/dL Final  . Calcium 04/17/2015 8.5* 8.9 - 10.3 mg/dL Final  . Total Protein 04/17/2015 8.5* 6.5 - 8.1 g/dL Final  . Albumin 04/17/2015 3.3* 3.5 - 5.0 g/dL Final  . AST 04/17/2015 161* 15 - 41 U/L Final  . ALT 04/17/2015 50  14 - 54 U/L Final  . Alkaline Phosphatase 04/17/2015 178* 38 - 126 U/L Final  . Total Bilirubin 04/17/2015 0.4  0.3 - 1.2 mg/dL Final  . GFR calc non Af Amer 04/17/2015 >60  >60 mL/min Final  . GFR calc Af Amer 04/17/2015 >60  >60 mL/min Final   Comment: (NOTE) The eGFR has been calculated using the CKD EPI equation. This calculation has not been validated in all clinical situations. eGFR's persistently <60 mL/min signify possible  Chronic Kidney Disease.   Georgiann Hahn gap 04/17/2015 7  5 - 15 Final  Hospital Outpatient Visit on 04/15/2015  Component Date Value Ref Range Status  . Glucose-Capillary 04/15/2015 85  65 - 99 mg/dL Final    STUDIES: Nm Pet Image Restag (ps) Skull Base To Thigh  04/15/2015  CLINICAL DATA:  Subsequent treatment strategy for lung cancer. Restaging examination. No recent chemotherapy or radiation therapy. EXAM: NUCLEAR MEDICINE PET SKULL BASE TO THIGH TECHNIQUE: 12.3 for mCi F-18 FDG was injected intravenously. Full-ring PET imaging was performed from the skull base to thigh after the radiotracer. CT data was obtained and used for attenuation correction and anatomic localization. FASTING BLOOD GLUCOSE:  Value: 85 mg/dl COMPARISON:  PET-CT 10/12/2014. FINDINGS: NECK No hypermetabolic lymph nodes in the neck. CHEST As with prior examinations there is extensive mass-like architectural distortion in the right upper lobe which is nearly completely chronically collapsed, most compatible with chronic postradiation mass-like fibrosis. There is some low-level metabolic activity throughout this region (SUVmax = 2.2)similar to the prior study. In addition, there is another area of probable mass-like fibrosis in the medial aspect of the right lower lobe, which also appears similar to prior examinations, and also demonstrate only some low-level metabolic activity (SUVmax = 2.1). Prominent lymphoid tissue in the right hilar, subcarinal nodal station, and prevascular nodal stations is also very similar to prior studies, demonstrating some low-level metabolic activity (SUVmax = 2.7). Left internal jugular single-lumen porta cath with tip terminating in the superior aspect of the right atrium. Heart size is normal. There is atherosclerosis of the thoracic aorta, the great vessels of the mediastinum and the coronary arteries, including calcified atherosclerotic plaque in the left circumflex and right coronary arteries. Esophagus  is unremarkable in appearance. No axillary lymphadenopathy. ABDOMEN/PELVIS No abnormal hypermetabolic activity within the liver, pancreas, adrenal glands, or spleen. No hypermetabolic lymph nodes in the abdomen or pelvis. Mild diffuse decreased attenuation throughout the hepatic parenchyma, compatible with hepatic steatosis. Atherosclerosis throughout the abdominal and pelvic vasculature. Some metabolic activity posterior and lateral to the right-sided the urinary bladder is similar to prior examination, favored to reflect activity within the distal right ureter (no soft tissue mass in this region). Heterogeneous appearance of the uterus, suggestive of multifocal partially calcified fibroids. SKELETON No focal hypermetabolic activity to suggest skeletal metastasis. IMPRESSION: 1. 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 Specifically, areas of postradiation mass-like fibrosis in the right upper lobe and right lower lobe are  unchanged. Multiple borderline enlarged and mildly enlarged mediastinal and right hilar lymph nodes are similar to prior examination, and continue to demonstrate very low-level metabolic activity, favored to reflect treated metastatic disease (although some residual disease is not entirely excluded). Continued attention on future followup studies is recommended. 2. Hepatic steatosis. 3. Atherosclerosis, including 2 vessel coronary artery disease. Please note that although the presence of coronary artery calcium documents the presence of coronary artery disease, the severity of this disease and any potential stenosis cannot be assessed on this non-gated CT examination. Assessment for potential risk factor modification, dietary therapy or pharmacologic therapy may be warranted, if clinically indicated. 4. Additional incidental findings, as above. Electronically Signed   By: Vinnie Langton M.D.   On: 04/15/2015 13:49    ASSESSMENT:  Stage III  squamous cell carcinoma of the lung. Hepatitis C. Chronic alcohol and tobacco abuse. Chronic arthritic joint pain.  PLAN:   1. Lung CA. PET scan has been reviewed by Dr. Oliva Bustard, findings of PET scan reveals stable disease. Advised patient to contact us with any new or worsening shortness of breath, hemoptysis, or chest wall pain. 2. Hepatitis C. Patient continues to smoke and drinks alcohol daily. Therefore treatment for hepatitis C has not been initiated. 3. Chronic alcohol and tobacco abuse. As mentioned above, patient continues to smoke and drink daily. Discussed smoking cessation and she is not interested in any further counseling. Also discussed alcohol abuse and methods for treatment and she was uninterested. 4. Chronic arthritic joint pain. Pain mostly in the lower back, right shoulder, and right knee. She continues to take hydrocodone as needed as well as a "tiny yellow pill" that her primary care provider prescribed. She states that the combination of these 2 medications helped greatly with her arthritic pains.  We will continue with routine follow-up in 3 months.  Patient expressed understanding and was in agreement with this plan. She also understands that She can call clinic at any time with any questions, concerns, or complaints.   Dr. Oliva Bustard was available for consultation and review of plan of care for this patient.   Evlyn Kanner, NP   04/17/2015 2:47 PM

## 2015-05-24 ENCOUNTER — Telehealth: Payer: Self-pay | Admitting: *Deleted

## 2015-05-24 DIAGNOSIS — C3490 Malignant neoplasm of unspecified part of unspecified bronchus or lung: Secondary | ICD-10-CM

## 2015-05-24 DIAGNOSIS — C801 Malignant (primary) neoplasm, unspecified: Secondary | ICD-10-CM

## 2015-05-24 MED ORDER — HYDROCODONE-ACETAMINOPHEN 5-325 MG PO TABS: 1.0000 | ORAL_TABLET | Freq: Four times a day (QID) | ORAL | Status: DC | PRN

## 2015-05-24 NOTE — Telephone Encounter (Signed)
Pt states she will be here before 430 as we close at 430

## 2015-06-08 ENCOUNTER — Emergency Department: Payer: Medicare Other | Admitting: Registered Nurse

## 2015-06-08 ENCOUNTER — Emergency Department: Payer: Medicare Other

## 2015-06-08 ENCOUNTER — Encounter
Admission: EM | Disposition: A | Discharge status: Home / Self Care | Payer: Self-pay | Source: Home / Self Care | Attending: Emergency Medicine

## 2015-06-08 ENCOUNTER — Emergency Department
Admission: EM | Admit: 2015-06-08 | Discharge: 2015-06-08 | Disposition: A | Discharge status: Home / Self Care | Payer: Medicare Other | Attending: Emergency Medicine | Admitting: Emergency Medicine

## 2015-06-08 ENCOUNTER — Encounter: Payer: Self-pay | Admitting: Emergency Medicine

## 2015-06-08 DIAGNOSIS — Z79899 Other long term (current) drug therapy: Secondary | ICD-10-CM | POA: Insufficient documentation

## 2015-06-08 DIAGNOSIS — F17210 Nicotine dependence, cigarettes, uncomplicated: Secondary | ICD-10-CM | POA: Insufficient documentation

## 2015-06-08 DIAGNOSIS — X58XXXA Exposure to other specified factors, initial encounter: Secondary | ICD-10-CM | POA: Diagnosis not present

## 2015-06-08 DIAGNOSIS — Z923 Personal history of irradiation: Secondary | ICD-10-CM | POA: Insufficient documentation

## 2015-06-08 DIAGNOSIS — R0602 Shortness of breath: Secondary | ICD-10-CM | POA: Diagnosis not present

## 2015-06-08 DIAGNOSIS — G8929 Other chronic pain: Secondary | ICD-10-CM | POA: Diagnosis not present

## 2015-06-08 DIAGNOSIS — T18128A Food in esophagus causing other injury, initial encounter: Secondary | ICD-10-CM | POA: Insufficient documentation

## 2015-06-08 DIAGNOSIS — Z9851 Tubal ligation status: Secondary | ICD-10-CM | POA: Diagnosis not present

## 2015-06-08 DIAGNOSIS — Z8501 Personal history of malignant neoplasm of esophagus: Secondary | ICD-10-CM | POA: Diagnosis not present

## 2015-06-08 DIAGNOSIS — Z9889 Other specified postprocedural states: Secondary | ICD-10-CM | POA: Diagnosis not present

## 2015-06-08 DIAGNOSIS — J439 Emphysema, unspecified: Secondary | ICD-10-CM | POA: Diagnosis not present

## 2015-06-08 DIAGNOSIS — M549 Dorsalgia, unspecified: Secondary | ICD-10-CM | POA: Diagnosis not present

## 2015-06-08 DIAGNOSIS — B182 Chronic viral hepatitis C: Secondary | ICD-10-CM | POA: Diagnosis not present

## 2015-06-08 DIAGNOSIS — Z79891 Long term (current) use of opiate analgesic: Secondary | ICD-10-CM | POA: Insufficient documentation

## 2015-06-08 DIAGNOSIS — Z7951 Long term (current) use of inhaled steroids: Secondary | ICD-10-CM | POA: Diagnosis not present

## 2015-06-08 DIAGNOSIS — F329 Major depressive disorder, single episode, unspecified: Secondary | ICD-10-CM | POA: Insufficient documentation

## 2015-06-08 DIAGNOSIS — R1310 Dysphagia, unspecified: Secondary | ICD-10-CM | POA: Diagnosis present

## 2015-06-08 DIAGNOSIS — K222 Esophageal obstruction: Secondary | ICD-10-CM | POA: Insufficient documentation

## 2015-06-08 DIAGNOSIS — Z85118 Personal history of other malignant neoplasm of bronchus and lung: Secondary | ICD-10-CM | POA: Insufficient documentation

## 2015-06-08 DIAGNOSIS — R000 Tachycardia, unspecified: Secondary | ICD-10-CM | POA: Diagnosis not present

## 2015-06-08 HISTORY — PX: ESOPHAGOGASTRODUODENOSCOPY: SHX5428

## 2015-06-08 LAB — COMPREHENSIVE METABOLIC PANEL
ALBUMIN: 3.8 g/dL (ref 3.5–5.0)
ALT: 69 U/L — ABNORMAL HIGH (ref 14–54)
AST: 185 U/L — AB (ref 15–41)
Alkaline Phosphatase: 182 U/L — ABNORMAL HIGH (ref 38–126)
Anion gap: 11 (ref 5–15)
BUN: 13 mg/dL (ref 6–20)
CHLORIDE: 106 mmol/L (ref 101–111)
CO2: 21 mmol/L — AB (ref 22–32)
Calcium: 9.4 mg/dL (ref 8.9–10.3)
Creatinine, Ser: 0.78 mg/dL (ref 0.44–1.00)
GFR calc Af Amer: >60 mL/min (ref >60)
GFR calc non Af Amer: >60 mL/min (ref >60)
GLUCOSE: 77 mg/dL (ref 65–99)
POTASSIUM: 3.6 mmol/L (ref 3.5–5.1)
Sodium: 138 mmol/L (ref 135–145)
Total Bilirubin: 1.6 mg/dL — ABNORMAL HIGH (ref 0.3–1.2)
Total Protein: 10.1 g/dL — ABNORMAL HIGH (ref 6.5–8.1)

## 2015-06-08 LAB — CBC WITH DIFFERENTIAL/PLATELET
BASOS ABS: 0.2 10*3/uL — AB (ref 0–0.1)
BASOS PCT: 3 %
EOS ABS: 0 10*3/uL (ref 0–0.7)
Eosinophils Relative: 1 %
HCT: 33.3 % — ABNORMAL LOW (ref 35.0–47.0)
Hemoglobin: 11.2 g/dL — ABNORMAL LOW (ref 12.0–16.0)
Lymphocytes Relative: 10 %
Lymphs Abs: 0.7 10*3/uL — ABNORMAL LOW (ref 1.0–3.6)
MCH: 28 pg (ref 26.0–34.0)
MCHC: 33.6 g/dL (ref 32.0–36.0)
MCV: 83.1 fL (ref 80.0–100.0)
MONO ABS: 0.5 10*3/uL (ref 0.2–0.9)
MONOS PCT: 8 %
NEUTROS PCT: 78 %
Neutro Abs: 5.1 10*3/uL (ref 1.4–6.5)
Platelets: 278 10*3/uL (ref 150–440)
RBC: 4 MIL/uL (ref 3.80–5.20)
RDW: 20.5 % — AB (ref 11.5–14.5)
WBC: 6.5 10*3/uL (ref 3.6–11.0)

## 2015-06-08 LAB — URINALYSIS COMPLETE WITH MICROSCOPIC (ARMC ONLY)
Bilirubin Urine: NEGATIVE
Glucose, UA: NEGATIVE mg/dL
HGB URINE DIPSTICK: NEGATIVE
Leukocytes, UA: NEGATIVE
Nitrite: NEGATIVE
PH: 5 (ref 5.0–8.0)
Protein, ur: 100 mg/dL — AB
RBC / HPF: NONE SEEN RBC/hpf (ref 0–5)
Specific Gravity, Urine: 1.017 (ref 1.005–1.030)

## 2015-06-08 LAB — LACTIC ACID, PLASMA: LACTIC ACID, VENOUS: 1.4 mmol/L (ref 0.5–2.0)

## 2015-06-08 LAB — TROPONIN I

## 2015-06-08 SURGERY — EGD (ESOPHAGOGASTRODUODENOSCOPY)
Anesthesia: General | Laterality: Left

## 2015-06-08 SURGERY — EGD (ESOPHAGOGASTRODUODENOSCOPY)
Anesthesia: General

## 2015-06-08 MED ORDER — LACTATED RINGERS IV SOLN
INTRAVENOUS | Status: DC | PRN
  Administered 2015-06-08: 20:00:00 via INTRAVENOUS

## 2015-06-08 MED ORDER — SODIUM CHLORIDE 0.9 % IV BOLUS (SEPSIS)
1000.0000 mL | Freq: Once | INTRAVENOUS | Status: AC
  Administered 2015-06-08: 1000 mL via INTRAVENOUS

## 2015-06-08 MED ORDER — PROPOFOL 10 MG/ML IV BOLUS
INTRAVENOUS | Status: DC | PRN
  Administered 2015-06-08: 50 mg via INTRAVENOUS

## 2015-06-08 MED ORDER — ONDANSETRON HCL 4 MG/2ML IJ SOLN
4.0000 mg | Freq: Once | INTRAMUSCULAR | Status: AC
  Administered 2015-06-08: 4 mg via INTRAVENOUS
  Filled 2015-06-08: qty 2

## 2015-06-08 NOTE — H&P (Signed)
    Primary Care Physician:  [facility] Primary Gastroenterologist:  Dr. Candace Cruise  Pre-Procedure History & Physical: HPI:  Jeanette Jones is a 60 y.o. female is here for an EGD   Past Medical History  Diagnosis Date  . Hep C w/o coma, chronic ([place])   . Back pain, chronic   . Swallowing difficulty   . Emphysema of lung ([place])   . Heart murmur   . Depression   . Lung cancer ([place])   . Carcinoma of lung ([place]) 10/12/2014    Past Surgical History  Procedure Laterality Date  . Lung biopsy    . Tubal ligation    . Hemorroidectomy      Prior to Admission medications   Medication Sig Start Date End Date Taking? Authorizing Provider  Fluticasone-Salmeterol (ADVAIR) 250-50 MCG/DOSE AEPB Inhale 1 puff into the lungs 2 (two) times daily as needed.    Historical Provider, MD  HYDROcodone-acetaminophen (NORCO/VICODIN) 5-325 MG tablet Take 1 tablet by mouth every 6 (six) hours as needed for moderate pain. 05/24/15   Lequita Asal, MD  Ipratropium-Albuterol (COMBIVENT) 20-100 MCG/ACT AERS respimat Inhale 2 puffs into the lungs 2 (two) times daily as needed for wheezing.    Historical Provider, MD  omeprazole (PRILOSEC) 40 MG capsule Take 40 mg by mouth daily.    Historical Provider, MD  potassium chloride (KLOR-CON) 20 MEQ packet Take 20 mEq by mouth daily. 11/16/14   Forest Gleason, MD    Allergies as of 06/08/2015  . (No Known Allergies)    Family History  Problem Relation Age of Onset  . Breast cancer Neg Hx     Social History   Social History  . Marital Status: Divorced    Spouse Name: N/A  . Number of Children: N/A  . Years of Education: N/A   Occupational History  . Not on file.   Social History Main Topics  . Smoking status: Current Every Day Smoker -- 0.25 packs/day    Types: Cigarettes  . Smokeless tobacco: Not on file  . Alcohol Use: Not on file  . Drug Use: Not on file  . Sexual Activity: Not on file   Other Topics Concern  . Not on file   Social History  Narrative    Review of Systems: See HPI, otherwise negative ROS  Physical Exam: BP 120/80 mmHg  Pulse 114  Temp(Src) 98 F (36.7 C)  Resp 23  Ht '5\' 3"'$  (1.6 m)  Wt 53.524 kg (118 lb)  BMI 20.91 kg/m2  SpO2 100% General:   Alert,  pleasant and cooperative in NAD Head:  Normocephalic and atraumatic. Neck:  Supple; no masses or thyromegaly. Lungs:  Clear throughout to auscultation.    Heart:  Regular rate and rhythm. Abdomen:  Soft, nontender and nondistended. Normal bowel sounds, without guarding, and without rebound.   Neurologic:  Alert and  oriented x4;  grossly normal neurologically.  Impression/Plan: Jeanette Jones is here for an EGD to be performed for Food impaction.  Risks, benefits, limitations, and alternatives regarding  EGD have been reviewed with the patient.  Questions have been answered.  All parties agreeable.   Glenisha Gundry, Lupita Dawn, MD  06/08/2015, 7:43 PM

## 2015-06-08 NOTE — ED Notes (Signed)
Patient transported to x-ray. ?

## 2015-06-08 NOTE — Op Note (Signed)
[facility] Gastroenterology Patient Name: Jeanette Jones Procedure Date: 06/08/2015 7:55 PM MRN: [id number] Account #: [id number] Date of Birth: 1955-12-13 Admit Type: Inpatient Age: 60 Room: [facility] ENDO ROOM 4 Gender: Female Note Status: Finalized Procedure:         Upper GI endoscopy Indications:       Possible foreign body in the esophagus, Hx of esophageal                     and lung cancer. Providers:         Lupita Dawn. Candace Cruise, MD Referring MD:      Dr Juanda Crumble drew clinic, MD (Referring MD) Medicines:         Monitored Anesthesia Care Complications:     No immediate complications. Procedure:         Pre-Anesthesia Assessment:                    - Prior to the procedure, a History and Physical was                     performed, and patient medications, allergies and                     sensitivities were reviewed. The patient's tolerance of                     previous anesthesia was reviewed.                    - The risks and benefits of the procedure and the sedation                     options and risks were discussed with the patient. All                     questions were answered and informed consent was obtained.                    - After reviewing the risks and benefits, the patient was                     deemed in satisfactory condition to undergo the procedure.                    After obtaining informed consent, the endoscope was passed                     under direct vision. Throughout the procedure, the                     patient's blood pressure, pulse, and oxygen saturations                     were monitored continuously. The Endoscope was introduced                     through the mouth, and advanced to the second part of                     duodenum. The upper GI endoscopy was accomplished without                     difficulty. The patient tolerated the procedure well. Findings:      One moderate benign-appearing, intrinsic  stenosis was found  in the       middle third of the esophagus. A TTS dilator was passed through the       scope. Dilation with a 12-13.5-15 mm balloon (to a maximum balloon size       of 15 mm) dilator was performed.      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      Food already passed.      The entire examined stomach was normal.      The examined duodenum was normal. Impression:        - Benign-appearing esophageal stenosis. Dilated.                    - Normal stomach.                    - Normal examined duodenum.                    - No specimens collected. Recommendation:    - Discharge patient to home.                    - Observe patient's clinical course. Procedure Code(s): --- Professional ---                    [phone], Esophagogastroduodenoscopy, flexible, transoral;                     with transendoscopic balloon dilation of esophagus (less                     than 30 mm diameter) Diagnosis Code(s): --- Professional ---                    K22.2, Esophageal obstruction                    T18.108A, Unspecified foreign body in esophagus causing                     other injury, initial encounter CPT copyright 2014 American Medical Association. All rights reserved. The codes documented in this report are preliminary and upon coder review may  be revised to meet current compliance requirements. Hulen Luster, MD 06/08/2015 8:23:37 PM This report has been signed electronically. Number of Addenda: 0 Note Initiated On: 06/08/2015 7:55 PM      [facility]

## 2015-06-08 NOTE — ED Notes (Signed)
MD requesting patient to try and drink some soda.

## 2015-06-08 NOTE — Op Note (Signed)
Food passed prior to EGD. Thus, no intubation done. 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 Esophagus dilated to 60m. Pt still needs to eat slowly and chew carefully.

## 2015-06-08 NOTE — Transfer of Care (Signed)
2Immediate Anesthesia Transfer of Care Note  Patient: Jeanette Jones  Procedure(s) Performed: Procedure(s): ESOPHAGOGASTRODUODENOSCOPY (EGD) (N/A)  Patient Location: PACU  Anesthesia Type:General  Level of Consciousness: awake and alert   Airway & Oxygen Therapy: Patient Spontanous Breathing  Post-op Assessment: Report given to RN  Post vital signs: stable  Last Vitals:  Filed Vitals:   06/08/15 1915 06/08/15 2018  BP:  130/80  Pulse: 114 100  Temp:  36.6 C  Resp: 23 16    Complications: No apparent anesthesia complications

## 2015-06-08 NOTE — ED Provider Notes (Signed)
[facility] Emergency Department Provider Note  Time seen: 4:37 PM  I have reviewed the triage vital signs and the nursing notes.   HISTORY  Chief Complaint cancer complication     HPI Jeanette Jones is a 60 y.o. female with a past medical history of hepatitis, depression, lung cancer, esophageal cancer who presents the emergency department unable to swallow. According to the patient she ate steak last night, since that time she has felt like there is something stuck in her throat, has not been able to swallow any liquid or food since. She is not able to swallow secretions.Denies history of food bolus in the past. Patient was on chemotherapy last month, has been on radiation in the past, is not currently taking either. Denies any pain. Denies any trouble breathing. Does state some nausea.     Past Medical History  Diagnosis Date  . Hep C w/o coma, chronic ([place])   . Back pain, chronic   . Swallowing difficulty   . Emphysema of lung ([place])   . Heart murmur   . Depression   . Lung cancer ([place])   . Carcinoma of lung ([place]) 10/12/2014    Patient Active Problem List   Diagnosis Date Noted  . Carcinoma of lung ([place]) 10/12/2014  . Hep C w/o coma, chronic ([place])   . Back pain, chronic   . Swallowing difficulty     Past Surgical History  Procedure Laterality Date  . Lung biopsy    . Tubal ligation    . Hemorroidectomy      Current Outpatient Rx  Name  Route  Sig  Dispense  Refill  . Fluticasone-Salmeterol (ADVAIR) 250-50 MCG/DOSE AEPB   Inhalation   Inhale 1 puff into the lungs 2 (two) times daily as needed.         Marland Kitchen HYDROcodone-acetaminophen (NORCO/VICODIN) 5-325 MG tablet   Oral   Take 1 tablet by mouth every 6 (six) hours as needed for moderate pain.   30 tablet   0   . Ipratropium-Albuterol (COMBIVENT) 20-100 MCG/ACT AERS respimat   Inhalation   Inhale 2 puffs into the lungs 2 (two) times daily as needed for wheezing.         Marland Kitchen  omeprazole (PRILOSEC) 40 MG capsule   Oral   Take 40 mg by mouth daily.         . potassium chloride (KLOR-CON) 20 MEQ packet   Oral   Take 20 mEq by mouth daily.   30 packet   1     Allergies Review of patient's allergies indicates no known allergies.  Family History  Problem Relation Age of Onset  . Breast cancer Neg Hx     Social History Social History  Substance Use Topics  . Smoking status: Current Every Day Smoker -- 0.25 packs/day    Types: Cigarettes  . Smokeless tobacco: None  . Alcohol Use: None    Review of Systems Constitutional: Negative for fever. Cardiovascular: Negative for chest pain. Respiratory: Negative for shortness of breath. Gastrointestinal: Negative for abdominal pain Genitourinary: Negative for dysuria. Neurological: Negative for headache 10-point ROS otherwise negative.  ____________________________________________   PHYSICAL EXAM:  VITAL SIGNS: ED Triage Vitals  Enc Vitals Group     BP 06/08/15 1616 116/83 mmHg     Pulse Rate 06/08/15 1616 152     Resp 06/08/15 1616 20     Temp 06/08/15 1616 98.1 F (36.7 C)     Temp src --  SpO2 06/08/15 1616 98 %     Weight 06/08/15 1616 118 lb (53.524 kg)     Height 06/08/15 1616 '5\' 3"'$  (1.6 m)     Head Cir --      Peak Flow --      Pain Score 06/08/15 1630 0     Pain Loc --      Pain Edu? --      Excl. in [place]? --     Constitutional: Alert and oriented. Well appearing and in no distress. Eyes: Normal exam ENT   Head: Normocephalic and atraumatic.   Mouth/Throat: Dry mucous membranes. Cardiovascular: Regular rhythm rate around 150 bpm. No murmur. Respiratory: Normal respiratory effort without tachypnea nor retractions. Breath sounds are clear and equal bilaterally. No wheezes/rales/rhonchi. Gastrointestinal: Soft and nontender. No distention.  Musculoskeletal: Nontender with normal range of motion in all extremities. Neurologic:  Normal speech and language. No gross focal  neurologic deficits  Skin:  Skin is warm, dry and intact.  Psychiatric: Mood and affect are normal. Speech and behavior are normal.   ____________________________________________    EKG  EKG reviewed and interpreted by myself shows sinus tachycardia 147 bpm, narrow QRS, normal axis, normal intervals, no ST changes. Normal besides tachycardic.  ____________________________________________     INITIAL IMPRESSION / ASSESSMENT AND PLAN / ED COURSE  Pertinent labs & imaging results that were available during my care of the patient were reviewed by me and considered in my medical decision making (see chart for details).  Patient presents being unable to swallow she is tachycardic, history of lung and esophageal cancer. States symptoms began when eating steak last night. Highly suspect food bolus impaction. We will check labs, treat nausea, IV hydrate, and likely discussed with GI medicine for further intervention.  GI medicine is taken to the endoscopy suite for further intervention. ____________________________________________   FINAL CLINICAL IMPRESSION(S) / ED DIAGNOSES  Food bolus impaction   Harvest Dark, MD 06/08/15 2258

## 2015-06-08 NOTE — ED Notes (Signed)
Pt provided with warm blankets. Pt updated regarding treatment plan. Pt verbalizes understanding. Family at bedside.

## 2015-06-08 NOTE — Anesthesia Preprocedure Evaluation (Signed)
Anesthesia Evaluation  Patient identified by MRN, date of birth, ID band Patient awake    Reviewed: Allergy & Precautions, H&P , NPO status , Patient's Chart, lab work & pertinent test results, reviewed documented beta blocker date and time   History of Anesthesia Complications Negative for: history of anesthetic complications  Airway Mallampati: II  TM Distance: >3 FB Neck ROM: full    Dental no notable dental hx. (+) Poor Dentition, Missing, Chipped   Pulmonary shortness of breath and with exertion, neg sleep apnea, COPD,  COPD inhaler, Recent URI , Current Smoker,  Lung cancer   Pulmonary exam normal breath sounds clear to auscultation       Cardiovascular Exercise Tolerance: Good hypertension, (-) angina(-) CAD, (-) Past MI, (-) Cardiac Stents and (-) CABG Normal cardiovascular exam(-) dysrhythmias + Valvular Problems/Murmurs  Rhythm:regular Rate:Normal     Neuro/Psych PSYCHIATRIC DISORDERS (Depression) negative neurological ROS     GI/Hepatic GERD  ,(+) Hepatitis -, C  Endo/Other  negative endocrine ROS  Renal/GU negative Renal ROS  negative genitourinary   Musculoskeletal   Abdominal   Peds  Hematology negative hematology ROS (+)   Anesthesia Other Findings Past Medical History:   Hep C w/o coma, chronic ([facility])                                Back pain, chronic                                           Swallowing difficulty                                        Emphysema of lung ([facility])                                      Heart murmur                                                 Depression                                                   Lung cancer ([place])                                            Carcinoma of lung ([place])                         10/12/2014   Patient with food bolus lodged, but likely passed it while in the ED.  Patient adamantly denied drug use despite being warned of possible anesthetic  interactions.  Reproductive/Obstetrics negative OB ROS  Anesthesia Physical Anesthesia Plan  ASA: IV and emergent  Anesthesia Plan: General   Post-op Pain Management:    Induction:   Airway Management Planned:   Additional Equipment:   Intra-op Plan:   Post-operative Plan:   Informed Consent: I have reviewed the patients History and Physical, chart, labs and discussed the procedure including the risks, benefits and alternatives for the proposed anesthesia with the patient or authorized representative who has indicated his/her understanding and acceptance.   Dental Advisory Given  Plan Discussed with: Anesthesiologist, CRNA and Surgeon  Anesthesia Plan Comments:         Anesthesia Quick Evaluation

## 2015-06-08 NOTE — ED Notes (Signed)
Shortness of breath and weakness per pt as well.

## 2015-06-08 NOTE — ED Notes (Signed)
Report received from [place], rn.

## 2015-06-08 NOTE — ED Notes (Signed)
Pt has lung CA and esophageal CA per pt.  For past 4 days pt is unable to get liquids or solids past certain point in throat because of the cancer per pt.  Pt has liquid in vomit bag in triage from trying to drink on way here and unable to get down.

## 2015-06-10 ENCOUNTER — Encounter: Payer: Self-pay | Admitting: Gastroenterology

## 2015-06-11 NOTE — Anesthesia Postprocedure Evaluation (Signed)
Anesthesia Post Note  Patient: Jeanette Jones  Procedure(s) Performed: Procedure(s) (LRB): ESOPHAGOGASTRODUODENOSCOPY (EGD) (N/A)  Patient location during evaluation: Endoscopy Anesthesia Type: General Level of consciousness: awake and alert Pain management: pain level controlled Vital Signs Assessment: post-procedure vital signs reviewed and stable Respiratory status: spontaneous breathing, nonlabored ventilation, respiratory function stable and patient connected to nasal cannula oxygen Cardiovascular status: blood pressure returned to baseline and stable Postop Assessment: no signs of nausea or vomiting Anesthetic complications: no    Last Vitals:  Filed Vitals:   06/08/15 2040 06/08/15 2050  BP: 103/74 116/76  Pulse: 110 103  Temp:    Resp: 17 20    Last Pain:  Filed Vitals:   06/08/15 2052  PainSc: 0-No pain                 Martha Clan

## 2015-06-26 ENCOUNTER — Other Ambulatory Visit: Payer: Self-pay | Admitting: *Deleted

## 2015-06-26 NOTE — Telephone Encounter (Signed)
Per Dr Oliva Bustard, will not refill med, patient informed and asked if that meant from now on, I replied that he said he is not goiing to fill it for her, and that she can call her PCP to see if they will give her med

## 2015-06-26 NOTE — Telephone Encounter (Signed)
Per Chart, her Norco was stopped at discharge on 1/21 By Dr Mike Gip

## 2015-07-17 ENCOUNTER — Inpatient Hospital Stay: Payer: Medicare Other | Admitting: Oncology

## 2015-07-17 ENCOUNTER — Inpatient Hospital Stay: Payer: Medicare Other

## 2015-07-23 ENCOUNTER — Inpatient Hospital Stay: Payer: Medicare Other

## 2015-07-23 ENCOUNTER — Inpatient Hospital Stay: Payer: Medicare Other | Attending: Oncology | Admitting: Oncology

## 2015-07-23 VITALS — BP 131/85 | HR 101 | Temp 96.7°F | Resp 18 | Wt 115.1 lb

## 2015-07-23 DIAGNOSIS — R040 Epistaxis: Secondary | ICD-10-CM | POA: Diagnosis not present

## 2015-07-23 DIAGNOSIS — F329 Major depressive disorder, single episode, unspecified: Secondary | ICD-10-CM | POA: Diagnosis not present

## 2015-07-23 DIAGNOSIS — J439 Emphysema, unspecified: Secondary | ICD-10-CM | POA: Diagnosis not present

## 2015-07-23 DIAGNOSIS — F17210 Nicotine dependence, cigarettes, uncomplicated: Secondary | ICD-10-CM | POA: Diagnosis not present

## 2015-07-23 DIAGNOSIS — C3490 Malignant neoplasm of unspecified part of unspecified bronchus or lung: Secondary | ICD-10-CM | POA: Insufficient documentation

## 2015-07-23 DIAGNOSIS — R1310 Dysphagia, unspecified: Secondary | ICD-10-CM | POA: Diagnosis not present

## 2015-07-23 DIAGNOSIS — F1020 Alcohol dependence, uncomplicated: Secondary | ICD-10-CM | POA: Diagnosis not present

## 2015-07-23 DIAGNOSIS — Z79899 Other long term (current) drug therapy: Secondary | ICD-10-CM

## 2015-07-23 DIAGNOSIS — R748 Abnormal levels of other serum enzymes: Secondary | ICD-10-CM | POA: Insufficient documentation

## 2015-07-23 LAB — COMPREHENSIVE METABOLIC PANEL
ALK PHOS: 148 U/L — AB (ref 38–126)
ALT: 41 U/L (ref 14–54)
AST: 156 U/L — AB (ref 15–41)
Albumin: 3.6 g/dL (ref 3.5–5.0)
Anion gap: 7 (ref 5–15)
BUN: 12 mg/dL (ref 6–20)
CALCIUM: 8.5 mg/dL — AB (ref 8.9–10.3)
CHLORIDE: 108 mmol/L (ref 101–111)
CO2: 18 mmol/L — AB (ref 22–32)
CREATININE: 0.56 mg/dL (ref 0.44–1.00)
GFR calc Af Amer: >60 mL/min (ref >60)
GFR calc non Af Amer: >60 mL/min (ref >60)
GLUCOSE: 87 mg/dL (ref 65–99)
Potassium: 3.5 mmol/L (ref 3.5–5.1)
SODIUM: 133 mmol/L — AB (ref 135–145)
Total Bilirubin: 0.5 mg/dL (ref 0.3–1.2)
Total Protein: 9 g/dL — ABNORMAL HIGH (ref 6.5–8.1)

## 2015-07-23 LAB — CBC WITH DIFFERENTIAL/PLATELET
BASOS ABS: 0 10*3/uL (ref 0–0.1)
Basophils Relative: 1 %
EOS ABS: 0 10*3/uL (ref 0–0.7)
EOS PCT: 1 %
HCT: 30 % — ABNORMAL LOW (ref 35.0–47.0)
HEMOGLOBIN: 10 g/dL — AB (ref 12.0–16.0)
LYMPHS ABS: 1.2 10*3/uL (ref 1.0–3.6)
LYMPHS PCT: 30 %
MCH: 27.4 pg (ref 26.0–34.0)
MCHC: 33.4 g/dL (ref 32.0–36.0)
MCV: 82 fL (ref 80.0–100.0)
Monocytes Absolute: 0.5 10*3/uL (ref 0.2–0.9)
Monocytes Relative: 12 %
NEUTROS PCT: 56 %
Neutro Abs: 2.3 10*3/uL (ref 1.4–6.5)
PLATELETS: 250 10*3/uL (ref 150–440)
RBC: 3.65 MIL/uL — AB (ref 3.80–5.20)
RDW: 18.1 % — ABNORMAL HIGH (ref 11.5–14.5)
WBC: 4.1 10*3/uL (ref 3.6–11.0)

## 2015-07-23 NOTE — Progress Notes (Signed)
Patient states she is sleeping a lot.  Her appetite is decreased due to not being able to swallow very well.  Also unable to take medication.

## 2015-07-24 ENCOUNTER — Encounter: Payer: Self-pay | Admitting: Oncology

## 2015-07-24 NOTE — Progress Notes (Signed)
[place] @ [facility] Telephone:(336) [phone]  Fax:(336) [place]: March 25, 1956  MR#: [id number]  JYN#:[id number]  Patient Care Team: [place] as PCP - General (General Practice)  CHIEF COMPLAINT:  Chief Complaint  Patient presents with  . Lung Cancer    Chief Complaint/Diagnosis:   1. 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 Faith Rogue, cardiothoracic surgeon) 2. Started on radiation and concurrent chemotherapy with carboplatinum Taxol from April of 2013 3. Finished radiation and chemotherapy with carboplatinum and Taxol in May of 2013 4. Stage IIIB (T4N3M0) NSCLC who completed a course of EBRT on Oct 07, 2011 with a total dose of 7000cGy.   5. Starting consolidation with 2 cycles of carboplatinum and Abraxane 6.patient is off chemotherapy since November of 2013.  Repeat CT scan shows stable disease(January, 2014) 7  Started been on Taxotere and  RAMICIRUMAB from June 07, 2013 8.chemotherapy with Taxotere  and  RAMUCIRUMAB discontinued in March of 2015 because of hemoptysis 9.Started [facility] April of 2015. 10NIVOLULAMABWas put on hold because of abnormal liver enzymes January of 2016       Oncology Flowsheet 06/08/2015  ondansetron (ZOFRAN) IV 4 mg    INTERVAL HISTORY: 60 year old lady with history of carcinoma lung.  Squamous cell carcinoma stage IIIA disease.  The patient is here as an add-on.  Patient did not go for routine CT scan which had been scheduled.  Patient complains of epistaxis.  Also has hepatitis C for which patient has not taken any treatment.  Continues to drink and smoke Patient is here for ongoing evaluation and treatment consideration.  Patient is feeling better.  Has occasional difficulty swallowing potassium pill.  On minimal medication.  At least pain medication is not being prescribed by [place] on a regular  basis at present time.  Patient continues to complain of some aches and pains which are nonspecific.Marland Kitchen Patient states she is sleeping a lot. Her appetite is decreased due to not being able to swallow very well. Also unable to take medication  REVIEW OF SYSTEMS:   Gen. status: General condition has improved.  Appetite is improving.  Patient continues to smoke. Marland Kitchen  HEENT : Complains of epistaxis off and on since been evaluated by ENT surgeon Lungs: Dry hacking cough.  No hemoptysis.  Continues to smoke. Cardiovascular system: No chest pain.  No palpitation.  No paroxysmal nocturnal dyspnea. Gastro intestinal system: No heartburn.  No nausea or vomiting.  No abdominal pain.  No diarrhea.  No constipation.  No rectal bleeding. Skin: No evidence of ecchymosis or rash. Neurological system: No dizziness.  No tingling.  His was.  no tingling numbness.  no focal weakness or any focal signs. Lower extreme no edema   Musculoskeletal system occasional intermittent aches and pains exact etiology not clear    As per HPI. Otherwise, a complete review of systems is negatve.  PAST MEDICAL HISTORY: Past Medical History  Diagnosis Date  . Hep C w/o coma, chronic ([place])   . Back pain, chronic   . Swallowing difficulty   . Emphysema of lung ([place])   . Heart murmur   . Depression   . Lung cancer ([place])   . Carcinoma of lung ([place]) 10/12/2014    PAST SURGICAL HISTORY: Past Surgical History  Procedure Laterality Date  . Lung biopsy    . Tubal ligation    . Hemorroidectomy    .  Esophagogastroduodenoscopy N/A 06/08/2015    Procedure: ESOPHAGOGASTRODUODENOSCOPY (EGD);  Surgeon: Hulen Luster, MD;  Location: [facility] ENDOSCOPY;  Service: Gastroenterology;  Laterality: N/A;    FAMILY HISTORY  No Known Allergies:   Significant History/PMH:   Hepatitis C:    Back Pain, Chronic:    Swallowing Difficulty:    Gastric Reflux:    Heart Murmur:    Lung Cancer:    HTN:    Depression:    copd:    Lung  Biopsy:    Tubal Ligation:    Hemorrhoidectomy:   Preventive Screening:  Has patient had any of the following test? Mammography  Pap Smear (1)   Last Mammography: 2011(1)   Last Pap Smear: 10 yrs ago?(1)   Smoking History: Smoking History 1(1)Packs per day(1)Smoking Cessation Information Given to Patient (1)  PFSH: Family History: negative, No family history of colorectal cancer, breast cancer, ovarian cancer  Comments: No family history of colorectal cancer, breast cancer, or ovarian cancer.  Social History: positive alcohol, positive tobacco  Smoker: Refused smoking cessation counseling  Smoker- Packs Per Day: 1  Smoker- How Many Years: 67  Comments: Patient is a chronic smoker one pack per day for more than 40 years.  Also drinks beer.  Does not use any illegal decrease in her drug  Additional Past Medical and Surgical History: Hypertension, COPD, chronic tobacco abuse.      ADVANCED DIRECTIVES: Patient does not have any advanced healthcare directive. Information has been given.   HEALTH MAINTENANCE: Social History  Substance Use Topics  . Smoking status: Current Every Day Smoker -- 0.25 packs/day    Types: Cigarettes  . Smokeless tobacco: None  . Alcohol Use: None   :  No Known Allergies  Current Outpatient Prescriptions  Medication Sig Dispense Refill  . Ipratropium-Albuterol (COMBIVENT) 20-100 MCG/ACT AERS respimat Inhale 2 puffs into the lungs 2 (two) times daily as needed for wheezing. Reported on 07/23/2015    . omeprazole (PRILOSEC) 40 MG capsule Take 40 mg by mouth daily. Reported on 07/23/2015     No current facility-administered medications for this visit.    OBJECTIVE:  Filed Vitals:   07/23/15 1515  BP: 131/85  Pulse: 101  Temp: 96.7 F (35.9 C)  Resp: 18     Body mass index is 20.39 kg/(m^2).    ECOG FS:1 - Symptomatic but completely ambulatory  PHYSICAL EXAM: Gen. status: Patient is alert and oriented somewhat apprehensive HEENT: There is  no evidence of active bleeding.  No jaundice.    lungs: Emphysematous chest.  No rhonchi or rales diminished air entry on both sides Cardiac exam revealed the PMI to be normally situated and sized. The rhythm was regular and no extrasystoles were noted during several minutes of auscultation. The first and second heart sounds were normal and physiologic splitting of the second heart sound was noted. There were no murmurs, rubs, clicks, or gallops Abdominal exam revealed normal bowel sounds. The abdomen was soft, non-tender, and without masses, organomegaly, or appreciable enlargement of the abdominal aorta. Examination of the skin revealed no evidence of significant rashes, suspicious appearing nevi or other concerning lesions. Neurologically, the patient was awake, alert, and oriented to person, place and time. There were no obvious focal neurologic abnormalities.   LAB RESULTS:  Appointment on 07/23/2015  Component Date Value Ref Range Status  . WBC 07/23/2015 4.1  3.6 - 11.0 K/uL Final  . RBC 07/23/2015 3.65* 3.80 - 5.20 MIL/uL Final  . Hemoglobin 07/23/2015 10.0* 12.0 -  16.0 g/dL Final  . HCT 07/23/2015 30.0* 35.0 - 47.0 % Final  . MCV 07/23/2015 82.0  80.0 - 100.0 fL Final  . MCH 07/23/2015 27.4  26.0 - 34.0 pg Final  . MCHC 07/23/2015 33.4  32.0 - 36.0 g/dL Final  . RDW 07/23/2015 18.1* 11.5 - 14.5 % Final  . Platelets 07/23/2015 250  150 - 440 K/uL Final  . Neutrophils Relative % 07/23/2015 56   Final  . Neutro Abs 07/23/2015 2.3  1.4 - 6.5 K/uL Final  . Lymphocytes Relative 07/23/2015 30   Final  . Lymphs Abs 07/23/2015 1.2  1.0 - 3.6 K/uL Final  . Monocytes Relative 07/23/2015 12   Final  . Monocytes Absolute 07/23/2015 0.5  0.2 - 0.9 K/uL Final  . Eosinophils Relative 07/23/2015 1   Final  . Eosinophils Absolute 07/23/2015 0.0  0 - 0.7 K/uL Final  . Basophils Relative 07/23/2015 1   Final  . Basophils Absolute 07/23/2015 0.0  0 - 0.1 K/uL Final  . Sodium 07/23/2015 133* 135 -  145 mmol/L Final  . Potassium 07/23/2015 3.5  3.5 - 5.1 mmol/L Final  . Chloride 07/23/2015 108  101 - 111 mmol/L Final  . CO2 07/23/2015 18* 22 - 32 mmol/L Final  . Glucose, Bld 07/23/2015 87  65 - 99 mg/dL Final  . BUN 07/23/2015 12  6 - 20 mg/dL Final  . Creatinine, Ser 07/23/2015 0.56  0.44 - 1.00 mg/dL Final  . Calcium 07/23/2015 8.5* 8.9 - 10.3 mg/dL Final  . Total Protein 07/23/2015 9.0* 6.5 - 8.1 g/dL Final  . Albumin 07/23/2015 3.6  3.5 - 5.0 g/dL Final  . AST 07/23/2015 156* 15 - 41 U/L Final  . ALT 07/23/2015 41  14 - 54 U/L Final  . Alkaline Phosphatase 07/23/2015 148* 38 - 126 U/L Final  . Total Bilirubin 07/23/2015 0.5  0.3 - 1.2 mg/dL Final  . GFR calc non Af Amer 07/23/2015 >60  >60 mL/min Final  . GFR calc Af Amer 07/23/2015 >60  >60 mL/min Final   Comment: (NOTE) The eGFR has been calculated using the CKD EPI equation. This calculation has not been validated in all clinical situations. eGFR's persistently <60 mL/min signify possible Chronic Kidney Disease.   . Anion gap 07/23/2015 7  5 - 15 Final        ASSESSMENT: 60 year old lady with stage III persistent or recurrent lung cancer treated with  NIVOLULAMAB and now put on hold because of abnormal liver enzymes.  Liver enzymes are gradually getting better. Patient continues to smoke and has been counseled regarding that. Nonspecific skeletal pain exact etiology not clear patient was advised to take occasional ibuprofen and was discouraged to use any narcotics. Patient also has alcohol dependency in but according to her she is cutting down on it. Hepatitis C No therapy has been started because of patient dependence on alcohol and patient was advised to continue follow-up with gastroenterologist for appropriate treatment option Chronic alcohol and tobacco abuse  MEDICAL DECISION MAKING:  All lab data has been reviewed.    Epistaxis has resolved. Patient has some difficulty swallowing pills particularly  potassium pill which has been discontinued.  If swallowing difficulty persist we would recommend upper endoscopy patient is already being followed by gastroenterologist for hepatitis C  PET scan has been scheduled in a few months and patient will be reevaluated Liver enzymes are getting better Hepatitis C  Patient was advised to continue follow-up with gastroenterologist  Patient expressed understanding  and was in agreement with this plan. She also understands that She can call clinic at any time with any questions, concerns, or complaints.   There is no clinical evidence of recurrent disease. Abnormal PET scan will be followed.  Patient continues to smoke and drink Exact etiology of leg pain that C complains 4 BC takes hydrocodone is not clear.  The patient was advised to get a clinic appointment for evaluation Will check urine for Screening next visit liverenzyme continues to be abnormal  No matching staging information was found for the patient.   Forest Gleason, MD   07/24/2015 8:47 AM

## 2015-09-19 ENCOUNTER — Ambulatory Visit
Admission: RE | Admit: 2015-09-19 | Discharge: 2015-09-19 | Disposition: A | Discharge status: Home / Self Care | Payer: Medicare Other | Source: Ambulatory Visit | Attending: Oncology | Admitting: Oncology

## 2015-09-19 DIAGNOSIS — R590 Localized enlarged lymph nodes: Secondary | ICD-10-CM | POA: Diagnosis not present

## 2015-09-19 DIAGNOSIS — C3490 Malignant neoplasm of unspecified part of unspecified bronchus or lung: Secondary | ICD-10-CM | POA: Insufficient documentation

## 2015-09-19 DIAGNOSIS — J8410 Pulmonary fibrosis, unspecified: Secondary | ICD-10-CM | POA: Diagnosis not present

## 2015-09-19 LAB — GLUCOSE, CAPILLARY: Glucose-Capillary: 75 mg/dL (ref 65–99)

## 2015-09-19 MED ORDER — FLUDEOXYGLUCOSE F - 18 (FDG) INJECTION
11.8700 | Freq: Once | INTRAVENOUS | Status: AC | PRN
  Administered 2015-09-19: 11.87 via INTRAVENOUS

## 2015-09-24 ENCOUNTER — Inpatient Hospital Stay: Payer: Medicare Other

## 2015-09-24 ENCOUNTER — Inpatient Hospital Stay: Payer: Medicare Other | Admitting: Oncology

## 2015-09-24 ENCOUNTER — Inpatient Hospital Stay: Payer: Medicare Other | Attending: Oncology | Admitting: Family Medicine

## 2015-09-24 VITALS — BP 135/92 | HR 106 | Temp 96.3°F | Resp 18 | Wt 115.3 lb

## 2015-09-24 DIAGNOSIS — C3491 Malignant neoplasm of unspecified part of right bronchus or lung: Secondary | ICD-10-CM | POA: Diagnosis present

## 2015-09-24 DIAGNOSIS — F1010 Alcohol abuse, uncomplicated: Secondary | ICD-10-CM | POA: Diagnosis not present

## 2015-09-24 DIAGNOSIS — R042 Hemoptysis: Secondary | ICD-10-CM | POA: Insufficient documentation

## 2015-09-24 DIAGNOSIS — R630 Anorexia: Secondary | ICD-10-CM | POA: Diagnosis not present

## 2015-09-24 DIAGNOSIS — B1920 Unspecified viral hepatitis C without hepatic coma: Secondary | ICD-10-CM | POA: Insufficient documentation

## 2015-09-24 DIAGNOSIS — Z79899 Other long term (current) drug therapy: Secondary | ICD-10-CM | POA: Diagnosis not present

## 2015-09-24 DIAGNOSIS — M545 Low back pain: Secondary | ICD-10-CM | POA: Insufficient documentation

## 2015-09-24 DIAGNOSIS — Z9221 Personal history of antineoplastic chemotherapy: Secondary | ICD-10-CM | POA: Diagnosis not present

## 2015-09-24 DIAGNOSIS — F17210 Nicotine dependence, cigarettes, uncomplicated: Secondary | ICD-10-CM | POA: Diagnosis not present

## 2015-09-24 DIAGNOSIS — C3490 Malignant neoplasm of unspecified part of unspecified bronchus or lung: Secondary | ICD-10-CM

## 2015-09-24 DIAGNOSIS — M79601 Pain in right arm: Secondary | ICD-10-CM | POA: Diagnosis not present

## 2015-09-24 LAB — COMPREHENSIVE METABOLIC PANEL
ALBUMIN: 3.4 g/dL — AB (ref 3.5–5.0)
ALK PHOS: 231 U/L — AB (ref 38–126)
ALT: 38 U/L (ref 14–54)
ANION GAP: 6 (ref 5–15)
AST: 101 U/L — ABNORMAL HIGH (ref 15–41)
BILIRUBIN TOTAL: 0.8 mg/dL (ref 0.3–1.2)
BUN: 8 mg/dL (ref 6–20)
CALCIUM: 9.2 mg/dL (ref 8.9–10.3)
CO2: 21 mmol/L — ABNORMAL LOW (ref 22–32)
Chloride: 107 mmol/L (ref 101–111)
Creatinine, Ser: 0.75 mg/dL (ref 0.44–1.00)
GFR calc non Af Amer: >60 mL/min (ref >60)
GLUCOSE: 99 mg/dL (ref 65–99)
POTASSIUM: 3.5 mmol/L (ref 3.5–5.1)
Sodium: 134 mmol/L — ABNORMAL LOW (ref 135–145)
TOTAL PROTEIN: 9.5 g/dL — AB (ref 6.5–8.1)

## 2015-09-24 LAB — CBC WITH DIFFERENTIAL/PLATELET
BASOS PCT: 1 %
Basophils Absolute: 0.1 10*3/uL (ref 0–0.1)
EOS ABS: 0.1 10*3/uL (ref 0–0.7)
Eosinophils Relative: 1 %
HEMATOCRIT: 28.8 % — AB (ref 35.0–47.0)
HEMOGLOBIN: 9.5 g/dL — AB (ref 12.0–16.0)
LYMPHS ABS: 1.3 10*3/uL (ref 1.0–3.6)
Lymphocytes Relative: 25 %
MCH: 24.4 pg — AB (ref 26.0–34.0)
MCHC: 33.1 g/dL (ref 32.0–36.0)
MCV: 73.7 fL — ABNORMAL LOW (ref 80.0–100.0)
MONO ABS: 0.6 10*3/uL (ref 0.2–0.9)
MONOS PCT: 10 %
NEUTROS PCT: 63 %
Neutro Abs: 3.4 10*3/uL (ref 1.4–6.5)
Platelets: 342 10*3/uL (ref 150–440)
RBC: 3.9 MIL/uL (ref 3.80–5.20)
RDW: 19.1 % — AB (ref 11.5–14.5)
WBC: 5.5 10*3/uL (ref 3.6–11.0)

## 2015-09-24 MED ORDER — PREDNISONE 10 MG PO TABS: 10.0000 mg | ORAL_TABLET | Freq: Every day | ORAL | Status: DC

## 2015-09-24 NOTE — Progress Notes (Signed)
[name]  Telephone:(336) [phone]  Fax:(336) [phone]     Jeanette Jones DOB: 07/27/1955  MR#: [id number]  FBP#:[id number]  Patient Care Team: [place] as PCP - General (General Practice)  CHIEF COMPLAINT:  Chief Complaint  Patient presents with  . Lung Cancer    INTERVAL HISTORY:  Patient is here for further follow-up regarding carcinoma of lung, stage IIIa upon diagnosis in March 2013. Patient also has history of hepatitis C. States is having lower back pain and right arm. Arm pain started last night and was relieved by taking old prescription for muscle relaxer. Back pain started this morning and the patient has not taken anything to relieve pain today. Having numbness in right arm that started last night as well. Decreased appetite since January. Experiences hot flashes for the past few years and have been getting worse. Had a PET scan last week.   REVIEW OF SYSTEMS:   Review of Systems  Constitutional: Positive for weight loss and malaise/fatigue. Negative for fever, chills and diaphoresis.  HENT: Negative.   Eyes: Negative.   Respiratory: Positive for cough and hemoptysis. Negative for sputum production, shortness of breath and wheezing.        Per patient since January she has had hemoptysis  Cardiovascular: Negative for chest pain, palpitations, orthopnea, claudication, leg swelling and PND.  Gastrointestinal: Negative for heartburn, nausea, vomiting, abdominal pain, diarrhea, constipation, blood in stool and melena.  Genitourinary: Negative.   Musculoskeletal: Negative.   Skin: Negative.   Neurological: Negative for dizziness, tingling, focal weakness, seizures and weakness.  Endo/Heme/Allergies: Does not bruise/bleed easily.  Psychiatric/Behavioral: Positive for substance abuse. Negative for depression. The patient is not nervous/anxious and does not have insomnia.     As per HPI. Otherwise, a complete review of systems is  negatve.  ONCOLOGY HISTORY: Oncology History   Chief Complaint/Diagnosis:   1. 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 Faith Rogue, cardiothoracic surgeon) 2. Started on radiation and concurrent chemotherapy with carboplatinum Taxol from April of 2013 3. Finished radiation and chemotherapy with carboplatinum and Taxol in May of 2013 4. Stage IIIB (T4N3M0) NSCLC who completed a course of EBRT on Oct 07, 2011 with a total dose of 7000cGy.   5. Starting consolidation with 2 cycles of carboplatinum and Abraxane 6.patient is off chemotherapy since November of 2013.  Repeat CT scan shows stable disease(January, 2014) 7  Started been on Taxotere and  RAMICIRUMAB from June 07, 2013 8.chemotherapy with Taxotere  and  RAMUCIRUMAB discontinued in March of 2015 because of hemoptysis 9.Started [facility] April of 2015. 10NIVOLULAMABWas put on hold because of abnormal liver enzymes January of 2016     Carcinoma of lung [facility])   10/12/2014 Initial Diagnosis Carcinoma of lung    PAST MEDICAL HISTORY: Past Medical History  Diagnosis Date  . Hep C w/o coma, chronic (Jeanette Jones)   . Back pain, chronic   . Swallowing difficulty   . Emphysema of lung (Jeanette Jones)   . Heart murmur   . Depression   . Lung cancer (Jeanette Jones)   . Carcinoma of lung (Jeanette Jones) 10/12/2014    PAST SURGICAL HISTORY: Past Surgical History  Procedure Laterality Date  . Lung biopsy    . Tubal ligation    . Hemorroidectomy    . Esophagogastroduodenoscopy N/A 06/08/2015    Procedure: ESOPHAGOGASTRODUODENOSCOPY (EGD);  Surgeon: Hulen Luster, MD;  Location: [facility] ENDOSCOPY;  Service: Gastroenterology;  Laterality: N/A;    FAMILY HISTORY Family History  Problem Relation Age of Onset  . Breast cancer Neg Hx     GYNECOLOGIC HISTORY:  No LMP recorded. Patient is postmenopausal.     ADVANCED DIRECTIVES:    HEALTH MAINTENANCE: Social History    Substance Use Topics  . Smoking status: Current Every Day Smoker -- 0.25 packs/day    Types: Cigarettes  . Smokeless tobacco: Not on file  . Alcohol Use: Not on file       No Known Allergies  Current Outpatient Prescriptions  Medication Sig Dispense Refill  . Ipratropium-Albuterol (COMBIVENT) 20-100 MCG/ACT AERS respimat Inhale 2 puffs into the lungs 2 (two) times daily as needed for wheezing. Reported on 07/23/2015    . predniSONE (DELTASONE) 10 MG tablet Take 1 tablet (10 mg total) by mouth daily with breakfast. 30 tablet 0   No current facility-administered medications for this visit.    OBJECTIVE: BP 135/92 mmHg  Pulse 106  Temp(Src) 96.3 F (35.7 C) (Tympanic)  Resp 18  Wt 115 lb 4.8 oz (52.3 kg)   Body mass index is 20.43 kg/(m^2).    ECOG FS:1 - Symptomatic but completely ambulatory  General: Well-developed, well-nourished, no acute distress. Eyes: Pink conjunctiva, anicteric sclera. HEENT: Normocephalic, moist mucous membranes, clear oropharnyx. Lungs: Clear to auscultation bilaterally. Heart: Regular rate and rhythm. No rubs, murmurs, or gallops. Abdomen: Soft, nontender, nondistended. No organomegaly noted, normoactive bowel sounds. Musculoskeletal: No edema, cyanosis, or clubbing. Low back pain. Neuro: Alert, answering all questions appropriately. Cranial nerves grossly intact. Skin: No rashes or petechiae noted. Psych: Normal affect. Lymphatics: No cervical, clavicular LAD.   LAB RESULTS:  Appointment on 09/24/2015  Component Date Value Ref Range Status  . WBC 09/24/2015 5.5  3.6 - 11.0 K/uL Final  . RBC 09/24/2015 3.90  3.80 - 5.20 MIL/uL Final  . Hemoglobin 09/24/2015 9.5* 12.0 - 16.0 g/dL Final  . HCT 09/24/2015 28.8* 35.0 - 47.0 % Final  . MCV 09/24/2015 73.7* 80.0 - 100.0 fL Final  . MCH 09/24/2015 24.4* 26.0 - 34.0 pg Final  . MCHC 09/24/2015 33.1  32.0 - 36.0 g/dL Final  . RDW 09/24/2015 19.1* 11.5 - 14.5 % Final  . Platelets 09/24/2015 342  150  - 440 K/uL Final  . Neutrophils Relative % 09/24/2015 63   Final  . Neutro Abs 09/24/2015 3.4  1.4 - 6.5 K/uL Final  . Lymphocytes Relative 09/24/2015 25   Final  . Lymphs Abs 09/24/2015 1.3  1.0 - 3.6 K/uL Final  . Monocytes Relative 09/24/2015 10   Final  . Monocytes Absolute 09/24/2015 0.6  0.2 - 0.9 K/uL Final  . Eosinophils Relative 09/24/2015 1   Final  . Eosinophils Absolute 09/24/2015 0.1  0 - 0.7 K/uL Final  . Basophils Relative 09/24/2015 1   Final  . Basophils Absolute 09/24/2015 0.1  0 - 0.1 K/uL Final  . Sodium 09/24/2015 134* 135 - 145 mmol/L Final  . Potassium 09/24/2015 3.5  3.5 - 5.1 mmol/L Final  . Chloride 09/24/2015 107  101 - 111 mmol/L Final  . CO2 09/24/2015 21* 22 - 32 mmol/L Final  . Glucose, Bld 09/24/2015 99  65 - 99 mg/dL Final  . BUN 09/24/2015 8  6 - 20 mg/dL Final  . Creatinine, Ser 09/24/2015 0.75  0.44 - 1.00 mg/dL Final  . Calcium 09/24/2015 9.2  8.9 - 10.3 mg/dL Final  . Total Protein 09/24/2015 9.5* 6.5 - 8.1 g/dL Final  .  Albumin 09/24/2015 3.4* 3.5 - 5.0 g/dL Final  . AST 09/24/2015 101* 15 - 41 U/L Final  . ALT 09/24/2015 38  14 - 54 U/L Final  . Alkaline Phosphatase 09/24/2015 231* 38 - 126 U/L Final  . Total Bilirubin 09/24/2015 0.8  0.3 - 1.2 mg/dL Final  . GFR calc non Af Amer 09/24/2015 >60  >60 mL/min Final  . GFR calc Af Amer 09/24/2015 >60  >60 mL/min Final   Comment: (NOTE) The eGFR has been calculated using the CKD EPI equation. This calculation has not been validated in all clinical situations. eGFR's persistently <60 mL/min signify possible Chronic Kidney Disease.   . Anion gap 09/24/2015 6  5 - 15 Final    STUDIES: No results found.  ASSESSMENT:  Carcinoma of right lung, stage IIIb, T4 N3 M0.  PLAN:   1. Lung cancer. Patient recently underwent PET scan which revealed 2 subcentimeter, approximately 7 mm each, new nodules in the right upper and right lower lobe. Patient is a poor historian but states that she has had  hemoptysis since January and has been losing some weight due to poor appetite. Patient does have a history of alcohol as well as drug abuse. Discussed results of PET scan with Dr. Oliva Bustard who would like to continue with follow-up in 3 months with a CT scan of the chest with contrast. Informed patient of this discussion and she is in agreement. We'll also refer patient to gastroenterologist due to complaints of being unable to swallow food.  Patient expressed understanding and was in agreement with this plan. She also understands that She can call clinic at any time with any questions, concerns, or complaints.   Dr. Grayland Ormond was available for consultation and review of plan of care for this patient.   Evlyn Kanner, NP   09/24/2015 3:07 PM

## 2015-09-24 NOTE — Progress Notes (Signed)
States is having lower back pain and right arm. Arm pain started last night and was relieved by taking old prescription for muscle relaxer. Back pain started this morning and the patient has not taken anything to relieve pain today. Having numbness in right arm that started last night as well. Decreased appetite since January. Experiences hot flashes for the past few years and have been getting worse.

## 2015-09-25 ENCOUNTER — Other Ambulatory Visit: Payer: Self-pay | Admitting: Family Medicine

## 2015-09-25 DIAGNOSIS — C3491 Malignant neoplasm of unspecified part of right bronchus or lung: Secondary | ICD-10-CM

## 2015-10-07 ENCOUNTER — Telehealth: Payer: Self-pay | Admitting: *Deleted

## 2015-10-07 ENCOUNTER — Other Ambulatory Visit: Payer: Self-pay | Admitting: Family Medicine

## 2015-10-07 NOTE — Telephone Encounter (Signed)
Called to report that she has been coughing up dark blood for 2 months but thought it was from nose bleeds, she is not having nose bleeds and continues to cough up blood and now she is complaining the her stools are dark for past 2 days. I spoke with L Herring, AGNP-C who recommends she go to the ER. I attempted to call pt back but had to leave a message as she did not answer either number we have for her.

## 2015-12-13 ENCOUNTER — Inpatient Hospital Stay: Payer: Medicare Other | Attending: Internal Medicine

## 2015-12-13 ENCOUNTER — Inpatient Hospital Stay: Payer: Medicare Other

## 2015-12-13 DIAGNOSIS — C3491 Malignant neoplasm of unspecified part of right bronchus or lung: Secondary | ICD-10-CM | POA: Diagnosis not present

## 2015-12-13 DIAGNOSIS — Z95828 Presence of other vascular implants and grafts: Secondary | ICD-10-CM

## 2015-12-13 DIAGNOSIS — Z452 Encounter for adjustment and management of vascular access device: Secondary | ICD-10-CM | POA: Diagnosis not present

## 2015-12-13 MED ORDER — HEPARIN SOD (PORK) LOCK FLUSH 100 UNIT/ML IV SOLN
INTRAVENOUS | Status: AC
  Filled 2015-12-13: qty 5

## 2015-12-13 MED ORDER — HEPARIN SOD (PORK) LOCK FLUSH 100 UNIT/ML IV SOLN
500.0000 [IU] | Freq: Once | INTRAVENOUS | Status: AC
  Administered 2015-12-13: 500 [IU] via INTRAVENOUS

## 2015-12-13 MED ORDER — SODIUM CHLORIDE 0.9% FLUSH
10.0000 mL | Freq: Once | INTRAVENOUS | Status: AC
Start: 2015-12-13 — End: 2015-12-13
  Administered 2015-12-13: 10 mL via INTRAVENOUS
  Filled 2015-12-13: qty 10

## 2015-12-24 ENCOUNTER — Inpatient Hospital Stay: Payer: Medicare Other | Attending: Internal Medicine

## 2015-12-24 ENCOUNTER — Other Ambulatory Visit: Payer: Self-pay | Admitting: *Deleted

## 2015-12-24 ENCOUNTER — Ambulatory Visit
Admission: RE | Admit: 2015-12-24 | Discharge: 2015-12-24 | Disposition: A | Discharge status: Home / Self Care | Payer: Medicare Other | Source: Ambulatory Visit | Attending: Family Medicine | Admitting: Family Medicine

## 2015-12-24 DIAGNOSIS — Z923 Personal history of irradiation: Secondary | ICD-10-CM | POA: Insufficient documentation

## 2015-12-24 DIAGNOSIS — F17210 Nicotine dependence, cigarettes, uncomplicated: Secondary | ICD-10-CM | POA: Insufficient documentation

## 2015-12-24 DIAGNOSIS — F329 Major depressive disorder, single episode, unspecified: Secondary | ICD-10-CM | POA: Insufficient documentation

## 2015-12-24 DIAGNOSIS — C7801 Secondary malignant neoplasm of right lung: Secondary | ICD-10-CM | POA: Insufficient documentation

## 2015-12-24 DIAGNOSIS — R599 Enlarged lymph nodes, unspecified: Secondary | ICD-10-CM | POA: Diagnosis not present

## 2015-12-24 DIAGNOSIS — I2510 Atherosclerotic heart disease of native coronary artery without angina pectoris: Secondary | ICD-10-CM | POA: Insufficient documentation

## 2015-12-24 DIAGNOSIS — I700 Atherosclerosis of aorta: Secondary | ICD-10-CM | POA: Insufficient documentation

## 2015-12-24 DIAGNOSIS — R918 Other nonspecific abnormal finding of lung field: Secondary | ICD-10-CM | POA: Diagnosis not present

## 2015-12-24 DIAGNOSIS — C3431 Malignant neoplasm of lower lobe, right bronchus or lung: Secondary | ICD-10-CM | POA: Insufficient documentation

## 2015-12-24 DIAGNOSIS — J439 Emphysema, unspecified: Secondary | ICD-10-CM | POA: Insufficient documentation

## 2015-12-24 DIAGNOSIS — Z79899 Other long term (current) drug therapy: Secondary | ICD-10-CM | POA: Insufficient documentation

## 2015-12-24 DIAGNOSIS — Z9221 Personal history of antineoplastic chemotherapy: Secondary | ICD-10-CM | POA: Insufficient documentation

## 2015-12-24 DIAGNOSIS — Z85118 Personal history of other malignant neoplasm of bronchus and lung: Secondary | ICD-10-CM

## 2015-12-24 DIAGNOSIS — R7989 Other specified abnormal findings of blood chemistry: Secondary | ICD-10-CM | POA: Insufficient documentation

## 2015-12-24 DIAGNOSIS — C3491 Malignant neoplasm of unspecified part of right bronchus or lung: Secondary | ICD-10-CM | POA: Diagnosis present

## 2015-12-24 DIAGNOSIS — B182 Chronic viral hepatitis C: Secondary | ICD-10-CM | POA: Insufficient documentation

## 2015-12-24 DIAGNOSIS — R1310 Dysphagia, unspecified: Secondary | ICD-10-CM | POA: Insufficient documentation

## 2015-12-24 MED ORDER — IOPAMIDOL (ISOVUE-300) INJECTION 61%
75.0000 mL | Freq: Once | INTRAVENOUS | Status: AC | PRN
  Administered 2015-12-24: 75 mL via INTRAVENOUS

## 2015-12-27 ENCOUNTER — Inpatient Hospital Stay: Payer: Medicare Other

## 2015-12-27 ENCOUNTER — Inpatient Hospital Stay (HOSPITAL_BASED_OUTPATIENT_CLINIC_OR_DEPARTMENT_OTHER): Payer: Medicare Other | Admitting: Internal Medicine

## 2015-12-27 DIAGNOSIS — C3431 Malignant neoplasm of lower lobe, right bronchus or lung: Secondary | ICD-10-CM | POA: Diagnosis present

## 2015-12-27 DIAGNOSIS — R7989 Other specified abnormal findings of blood chemistry: Secondary | ICD-10-CM

## 2015-12-27 DIAGNOSIS — Z85118 Personal history of other malignant neoplasm of bronchus and lung: Secondary | ICD-10-CM | POA: Diagnosis not present

## 2015-12-27 DIAGNOSIS — I700 Atherosclerosis of aorta: Secondary | ICD-10-CM | POA: Diagnosis not present

## 2015-12-27 DIAGNOSIS — Z923 Personal history of irradiation: Secondary | ICD-10-CM

## 2015-12-27 DIAGNOSIS — J439 Emphysema, unspecified: Secondary | ICD-10-CM | POA: Diagnosis not present

## 2015-12-27 DIAGNOSIS — Z79899 Other long term (current) drug therapy: Secondary | ICD-10-CM | POA: Diagnosis not present

## 2015-12-27 DIAGNOSIS — C7801 Secondary malignant neoplasm of right lung: Secondary | ICD-10-CM

## 2015-12-27 DIAGNOSIS — Z9221 Personal history of antineoplastic chemotherapy: Secondary | ICD-10-CM

## 2015-12-27 DIAGNOSIS — I2510 Atherosclerotic heart disease of native coronary artery without angina pectoris: Secondary | ICD-10-CM | POA: Diagnosis not present

## 2015-12-27 DIAGNOSIS — R1310 Dysphagia, unspecified: Secondary | ICD-10-CM | POA: Diagnosis not present

## 2015-12-27 DIAGNOSIS — F329 Major depressive disorder, single episode, unspecified: Secondary | ICD-10-CM | POA: Diagnosis not present

## 2015-12-27 DIAGNOSIS — F17210 Nicotine dependence, cigarettes, uncomplicated: Secondary | ICD-10-CM | POA: Diagnosis not present

## 2015-12-27 DIAGNOSIS — B182 Chronic viral hepatitis C: Secondary | ICD-10-CM | POA: Diagnosis not present

## 2015-12-27 LAB — CBC WITH DIFFERENTIAL/PLATELET
BASOS PCT: 1 %
Basophils Absolute: 0 10*3/uL (ref 0–0.1)
Eosinophils Absolute: 0.1 10*3/uL (ref 0–0.7)
Eosinophils Relative: 2 %
HEMATOCRIT: 27.7 % — AB (ref 35.0–47.0)
HEMOGLOBIN: 9.1 g/dL — AB (ref 12.0–16.0)
LYMPHS ABS: 1.1 10*3/uL (ref 1.0–3.6)
Lymphocytes Relative: 28 %
MCH: 23.3 pg — AB (ref 26.0–34.0)
MCHC: 33 g/dL (ref 32.0–36.0)
MCV: 70.6 fL — ABNORMAL LOW (ref 80.0–100.0)
MONO ABS: 0.5 10*3/uL (ref 0.2–0.9)
MONOS PCT: 12 %
NEUTROS ABS: 2.3 10*3/uL (ref 1.4–6.5)
NEUTROS PCT: 57 %
Platelets: 250 10*3/uL (ref 150–440)
RBC: 3.93 MIL/uL (ref 3.80–5.20)
RDW: 22.3 % — AB (ref 11.5–14.5)
WBC: 4.1 10*3/uL (ref 3.6–11.0)

## 2015-12-27 LAB — COMPREHENSIVE METABOLIC PANEL
ALBUMIN: 3.5 g/dL (ref 3.5–5.0)
ALT: 31 U/L (ref 14–54)
AST: 97 U/L — ABNORMAL HIGH (ref 15–41)
Alkaline Phosphatase: 212 U/L — ABNORMAL HIGH (ref 38–126)
Anion gap: 6 (ref 5–15)
BUN: 8 mg/dL (ref 6–20)
CHLORIDE: 108 mmol/L (ref 101–111)
CO2: 20 mmol/L — ABNORMAL LOW (ref 22–32)
Calcium: 8.8 mg/dL — ABNORMAL LOW (ref 8.9–10.3)
Creatinine, Ser: 0.72 mg/dL (ref 0.44–1.00)
GFR calc Af Amer: >60 mL/min (ref >60)
Glucose, Bld: 89 mg/dL (ref 65–99)
POTASSIUM: 3.4 mmol/L — AB (ref 3.5–5.1)
Sodium: 134 mmol/L — ABNORMAL LOW (ref 135–145)
Total Bilirubin: 0.4 mg/dL (ref 0.3–1.2)
Total Protein: 9.6 g/dL — ABNORMAL HIGH (ref 6.5–8.1)

## 2015-12-27 NOTE — Assessment & Plan Note (Addendum)
#   Right lung ca- with likley recurrence- metastases to right lung ~10-24m Will discuss at tumor conference; if RT not an option recommend re-Starting OPDIVO.   I discussed the mechanism of action; The goal of therapy is palliative; and length of treatments are likely ongoing/based upon the results of the scans. Discussed the potential side effects of immunotherapy including but not limited to diarrhea; skin rash; elevated LFTs/endocrine abnormalities etc.   # Dysphagia- radiation stricture s/p dilatation- Jan 2017 [Dr.Oh] recommend esophagogram.   # weight loss-sec to #1.   # smoking- counseled to quit smoking.   # alcohol abuse- ? Elevated LFts.

## 2015-12-27 NOTE — Progress Notes (Signed)
Patient states she has no appetite.  States she gags when she tries to eat.  SOB on exertion.  O2 99%.

## 2015-12-27 NOTE — Progress Notes (Signed)
[place] OFFICE PROGRESS NOTE  Patient Care Team: [place] as PCP - General (General Practice)  No matching staging information was found for the patient.   Oncology History   Chief Complaint/Diagnosis:   1. 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 Faith Rogue, cardiothoracic surgeon) 2. Started on radiation and concurrent chemotherapy with carboplatinum Taxol from April of 2013 3. Finished radiation and chemotherapy with carboplatinum and Taxol in May of 2013 4. Stage IIIB (T4N3M0) NSCLC who completed a course of EBRT on Oct 07, 2011 with a total dose of 7000cGy.   5. Starting consolidation with 2 cycles of carboplatinum and Abraxane 6.patient is off chemotherapy since November of 2013.  Repeat CT scan shows stable disease(January, 2014) 7  Started been on Taxotere and  RAMICIRUMAB from June 07, 2013 8.chemotherapy with Taxotere  and  RAMUCIRUMAB discontinued in March of 2015 because of hemoptysis 9.Started [facility] April of 2015. 10NIVOLULAMABWas put on hold because of abnormal liver enzymes January of 2016  # AUG 2017- Right lung recurrence [2 nodules - ~1cm; stable lung mass/fibrosis].    # MOLECULAR TESTING- ? Not enough tissue.      Carcinoma of lung ([place])   10/12/2014 Initial Diagnosis    Carcinoma of lung      Primary cancer of bronchus of right lower lobe ([place])   12/27/2015 Initial Diagnosis    Primary cancer of bronchus of right lower lobe [facility])      This is my first interaction with the patient; Patient's previous treating oncologist was Dr. Oliva Bustard.  I reviewed the patient's prior charts/pertinent labs/imaging in detail; findings are summarized above.     INTERVAL HISTORY:  Jeanette Jones 60 y.o.  female pleasant patient above history of stage III lung cancer favoring squamous cell- is currently off treatment is here  to review the results of the restaging CAT scan.  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 Denies any nausea vomiting.  Does admit to chronic shortness of breath. Chronic cough. No hemoptysis. No headaches.  REVIEW OF SYSTEMS:  A complete 10 point review of system is done which is negative except mentioned above/history of present illness.   PAST MEDICAL HISTORY :  Past Medical History:  Diagnosis Date  . Back pain, chronic   . Carcinoma of lung ([place]) 10/12/2014  . Depression   . Emphysema of lung ([place])   . Heart murmur   . Hep C w/o coma, chronic ([place])   . Lung cancer ([place])   . Swallowing difficulty     PAST SURGICAL HISTORY :   Past Surgical History:  Procedure Laterality Date  . ESOPHAGOGASTRODUODENOSCOPY N/A 06/08/2015   Procedure: ESOPHAGOGASTRODUODENOSCOPY (EGD);  Surgeon: Hulen Luster, MD;  Location: [facility] ENDOSCOPY;  Service: Gastroenterology;  Laterality: N/A;  . HEMORROIDECTOMY    . LUNG BIOPSY    . TUBAL LIGATION      FAMILY HISTORY :   Family History  Problem Relation Age of Onset  . Breast cancer Neg Hx     SOCIAL HISTORY:   Social History  Substance Use Topics  . Smoking status: Current Every Day Smoker    Packs/day: 0.25    Types: Cigarettes  . Smokeless tobacco: Not on file  . Alcohol use Not on file    ALLERGIES:  has No Known Allergies.  MEDICATIONS:  No current outpatient prescriptions on file.  No current facility-administered medications for this visit.     PHYSICAL EXAMINATION: ECOG PERFORMANCE STATUS: 0 - Asymptomatic  BP 130/86 (BP Location: Right Arm, Patient Position: Sitting)   Pulse 96   Temp 97.2 F (36.2 C) (Tympanic)   Resp 18   Wt 111 lb (50.3 kg)   BMI 19.66 kg/m   Filed Weights   12/27/15 1102  Weight: 111 lb (50.3 kg)    GENERAL: Well-nourished well-developed; Alert, no distress and comfortable.   Alone.  EYES: no pallor or icterus OROPHARYNX: no thrush or  ulceration; good dentition  NECK: supple, no masses felt LYMPH:  no palpable lymphadenopathy in the cervical, axillary or inguinal regions LUNGS: clear to auscultation and  No wheeze or crackles HEART/CVS: regular rate & rhythm and no murmurs; No lower extremity edema ABDOMEN:abdomen soft, non-tender and normal bowel sounds Musculoskeletal:no cyanosis of digits and no clubbing  PSYCH: alert & oriented x 3 with fluent speech NEURO: no focal motor/sensory deficits SKIN:  no rashes or significant lesions  LABORATORY DATA:  I have reviewed the data as listed    Component Value Date/Time   NA 134 (L) 12/27/2015 1026   NA 135 08/29/2014 1457   K 3.4 (L) 12/27/2015 1026   K 3.7 08/29/2014 1457   CL 108 12/27/2015 1026   CL 105 08/29/2014 1457   CO2 20 (L) 12/27/2015 1026   CO2 24 08/29/2014 1457   GLUCOSE 89 12/27/2015 1026   GLUCOSE 98 08/29/2014 1457   BUN 8 12/27/2015 1026   BUN 8 08/29/2014 1457   CREATININE 0.72 12/27/2015 1026   CREATININE 0.64 08/29/2014 1457   CALCIUM 8.8 (L) 12/27/2015 1026   CALCIUM 9.0 08/29/2014 1457   PROT 9.6 (H) 12/27/2015 1026   PROT 9.3 (H) 08/29/2014 1457   ALBUMIN 3.5 12/27/2015 1026   ALBUMIN 3.6 08/29/2014 1457   AST 97 (H) 12/27/2015 1026   AST 111 (H) 08/29/2014 1457   ALT 31 12/27/2015 1026   ALT 78 (H) 08/29/2014 1457   ALKPHOS 212 (H) 12/27/2015 1026   ALKPHOS 168 (H) 08/29/2014 1457   BILITOT 0.4 12/27/2015 1026   BILITOT 0.6 08/29/2014 1457   GFRNONAA >60 12/27/2015 1026   GFRNONAA >60 08/29/2014 1457   GFRAA >60 12/27/2015 1026   GFRAA >60 08/29/2014 1457    No results found for: SPEP, UPEP  Lab Results  Component Value Date   WBC 4.1 12/27/2015   NEUTROABS 2.3 12/27/2015   HGB 9.1 (L) 12/27/2015   HCT 27.7 (L) 12/27/2015   MCV 70.6 (L) 12/27/2015   PLT 250 12/27/2015      Chemistry      Component Value Date/Time   NA 134 (L) 12/27/2015 1026   NA 135 08/29/2014 1457   K 3.4 (L) 12/27/2015 1026   K 3.7 08/29/2014  1457   CL 108 12/27/2015 1026   CL 105 08/29/2014 1457   CO2 20 (L) 12/27/2015 1026   CO2 24 08/29/2014 1457   BUN 8 12/27/2015 1026   BUN 8 08/29/2014 1457   CREATININE 0.72 12/27/2015 1026   CREATININE 0.64 08/29/2014 1457      Component Value Date/Time   CALCIUM 8.8 (L) 12/27/2015 1026   CALCIUM 9.0 08/29/2014 1457   ALKPHOS 212 (H) 12/27/2015 1026   ALKPHOS 168 (H) 08/29/2014 1457   AST 97 (H) 12/27/2015 1026   AST 111 (H) 08/29/2014 1457   ALT 31 12/27/2015 1026   ALT 78 (H) 08/29/2014 1457   BILITOT 0.4  12/27/2015 1026   BILITOT 0.6 08/29/2014 1457      IMPRESSION: 1. Interval growth of irregular solid 1.0 cm pulmonary nodules in the basilar right upper lobe and central right lower lobe, most suggestive of pulmonary metastases. 2. Stable masslike fibrosis in the right upper and medial right lower lobes. 3. New patchy ground-glass opacity in the left lower lobe, probably inflammatory, recommend attention on follow-up chest CT. 4. Stable mild subcarinal lymphadenopathy, nodal metastasis not excluded. 5. Additional findings include aortic atherosclerosis and 2 vessel coronary atherosclerosis.   Electronically Signed   By: Ilona Sorrel M.D.   On: 12/24/2015 13:47  RADIOGRAPHIC STUDIES: I have personally reviewed the radiological images as listed and agreed with the findings in the report. No results found.   ASSESSMENT & PLAN:  Primary cancer of bronchus of right lower lobe ([place]) # Right lung ca- with likley recurrence- metastases to right lung ~10-12m Will discuss at tumor conference; if RT not an option recommend re-Starting OPDIVO.   I discussed the mechanism of action; The goal of therapy is palliative; and length of treatments are likely ongoing/based upon the results of the scans. Discussed the potential side effects of immunotherapy including but not limited to diarrhea; skin rash; elevated LFTs/endocrine abnormalities etc.   # Dysphagia- radiation  stricture s/p dilatation- Jan 2017 [Dr.Oh] recommend esophagogram.   # weight loss-sec to #1.   # smoking- counseled to quit smoking.   # alcohol abuse- ? Elevated LFts.   # Follow with me in approximately 2 weeks/start treatment labs.  Orders Placed This Encounter  Procedures  . DG Esophagus    Standing Status:   Future    Standing Expiration Date:   02/25/2017    Order Specific Question:   Reason for Exam (SYMPTOM  OR DIAGNOSIS REQUIRED)    Answer:   dysphagia    Comments:   use tablet    Order Specific Question:   Is the patient pregnant?    Answer:   No    Order Specific Question:   [facility]    [facility]    Standing Status:   Future    Standing Expiration Date:   01/09/2017  . Comprehensive metabolic panel    Standing Status:   Future    Standing Expiration Date:   01/09/2017  . TSH    Standing Status:   Future    Standing Expiration Date:   01/09/2017   All questions were answered. The patient knows to call the clinic with any problems, questions or concerns.   # I reviewed the blood work- with the patient in detail; also reviewed the imaging independently [as summarized above]; and with the patient in detail.    # 25 minutes face-to-face with the patient discussing the above plan of care; more than 50% of time spent on prognosis/ natural history; counseling and coordination.      GCammie Sickle MD 12/27/2015 5:15 PM

## 2015-12-30 ENCOUNTER — Other Ambulatory Visit: Payer: Self-pay | Admitting: Internal Medicine

## 2016-01-02 ENCOUNTER — Ambulatory Visit: Payer: Medicare Other | Attending: Internal Medicine

## 2016-01-06 ENCOUNTER — Encounter: Payer: Self-pay | Admitting: Radiation Oncology

## 2016-01-06 ENCOUNTER — Ambulatory Visit
Admission: RE | Admit: 2016-01-06 | Discharge: 2016-01-06 | Disposition: A | Discharge status: Home / Self Care | Payer: Medicare Other | Source: Ambulatory Visit | Attending: Radiation Oncology | Admitting: Radiation Oncology

## 2016-01-06 ENCOUNTER — Other Ambulatory Visit: Payer: Self-pay | Admitting: *Deleted

## 2016-01-06 VITALS — BP 127/87 | HR 97 | Temp 97.6°F | Resp 22 | Wt 109.2 lb

## 2016-01-06 DIAGNOSIS — Z923 Personal history of irradiation: Secondary | ICD-10-CM | POA: Diagnosis not present

## 2016-01-06 DIAGNOSIS — F17210 Nicotine dependence, cigarettes, uncomplicated: Secondary | ICD-10-CM | POA: Insufficient documentation

## 2016-01-06 DIAGNOSIS — C3431 Malignant neoplasm of lower lobe, right bronchus or lung: Secondary | ICD-10-CM

## 2016-01-06 DIAGNOSIS — F329 Major depressive disorder, single episode, unspecified: Secondary | ICD-10-CM | POA: Diagnosis not present

## 2016-01-06 DIAGNOSIS — M549 Dorsalgia, unspecified: Secondary | ICD-10-CM | POA: Insufficient documentation

## 2016-01-06 DIAGNOSIS — B1920 Unspecified viral hepatitis C without hepatic coma: Secondary | ICD-10-CM | POA: Insufficient documentation

## 2016-01-06 DIAGNOSIS — R011 Cardiac murmur, unspecified: Secondary | ICD-10-CM | POA: Insufficient documentation

## 2016-01-06 DIAGNOSIS — R630 Anorexia: Secondary | ICD-10-CM | POA: Insufficient documentation

## 2016-01-06 DIAGNOSIS — Z9221 Personal history of antineoplastic chemotherapy: Secondary | ICD-10-CM | POA: Insufficient documentation

## 2016-01-06 DIAGNOSIS — J439 Emphysema, unspecified: Secondary | ICD-10-CM | POA: Insufficient documentation

## 2016-01-06 DIAGNOSIS — R918 Other nonspecific abnormal finding of lung field: Secondary | ICD-10-CM | POA: Insufficient documentation

## 2016-01-06 DIAGNOSIS — G8929 Other chronic pain: Secondary | ICD-10-CM | POA: Diagnosis not present

## 2016-01-06 DIAGNOSIS — R05 Cough: Secondary | ICD-10-CM | POA: Diagnosis not present

## 2016-01-06 DIAGNOSIS — C3491 Malignant neoplasm of unspecified part of right bronchus or lung: Secondary | ICD-10-CM | POA: Insufficient documentation

## 2016-01-06 DIAGNOSIS — R1310 Dysphagia, unspecified: Secondary | ICD-10-CM | POA: Insufficient documentation

## 2016-01-06 NOTE — Consult Note (Signed)
Except an outstanding is perfect of Radiation Oncology NEW PATIENT EVALUATION  Name: Jeanette Jones  MRN: [id number]  Date:   01/06/2016     DOB: March 24, 1956   This 60 y.o. female patient presents to the clinic for initial evaluation of 2 new right lung nodules suspicious for metastatic lung cancer.  REFERRING PHYSICIAN: [place], [place]:  Chief Complaint  Patient presents with  . Lung Cancer    Pt is here for initial consultation of lung cancer.      DIAGNOSIS: The encounter diagnosis was Malignant neoplasm of right lung, unspecified part of lung ([place]).   PREVIOUS INVESTIGATIONS:  PET CT and CT scans reviewed Clinical notes reviewed Previous treatment plans reviewed  HPI: Patient is a 60 year old female treated back in 2013 for stage IIIB (T4 N3 M0) non-small cell lung cancer treated with concurrent chemoradiation. Cytology was positive for non-small cell lung cancer favoring squamous cell carcinoma. She tolerated her radiation therapy and chemotherapy well then went on consolidative chemotherapy she has been treated with immunotherapy recently had a repeat CT scan of the chest showing 2 right lung nodules with interval growth and irregular 1 cm nodule on the basilar right upper lobe and central right lower lobe most suggestive of pulmonary metastasis. Her major complaint is poor by mouth intake and anorexia and she's currently on ensure. I have refilled that today. She does occasionally have a cough only very intermittent hemoptysis. She she has no cytology positive biopsy done on her lungs recently since these lesions are too small to accurately target. I've been asked to evaluate the patient for possible further radiation therapy.  PLANNED TREATMENT REGIMEN: Observation at this time with possible Opdivo  PAST MEDICAL HISTORY:  has a past medical history of Back pain, chronic; Carcinoma of lung ([place]) (10/12/2014); Depression; Emphysema of lung ([place]); Heart  murmur; Hep C w/o coma, chronic ([place]); Lung cancer ([place]); and Swallowing difficulty.    PAST SURGICAL HISTORY:  Past Surgical History:  Procedure Laterality Date  . ESOPHAGOGASTRODUODENOSCOPY N/A 06/08/2015   Procedure: ESOPHAGOGASTRODUODENOSCOPY (EGD);  Surgeon: Hulen Luster, MD;  Location: [facility] ENDOSCOPY;  Service: Gastroenterology;  Laterality: N/A;  . HEMORROIDECTOMY    . LUNG BIOPSY    . TUBAL LIGATION      FAMILY HISTORY: family history is not on file.  SOCIAL HISTORY:  reports that she has been smoking Cigarettes.  She has been smoking about 0.25 packs per day. She does not have any smokeless tobacco history on file.  ALLERGIES: Review of patient's allergies indicates no known allergies.  MEDICATIONS:  No current outpatient prescriptions on file.   No current facility-administered medications for this encounter.     ECOG PERFORMANCE STATUS:  1 - Symptomatic but completely ambulatory  REVIEW OF SYSTEMS: Except for the weight loss and occasional slight productive cough and occasional hemoptysis  Patient denies any weight loss, fatigue, weakness, fever, chills or night sweats. Patient denies any loss of vision, blurred vision. Patient denies any ringing  of the ears or hearing loss. No irregular heartbeat. Patient denies heart murmur or history of fainting. Patient denies any chest pain or pain radiating to her upper extremities. Patient denies any shortness of breath, difficulty breathing at night, cough or hemoptysis. Patient denies any swelling in the lower legs. Patient denies any nausea vomiting, vomiting of blood, or coffee ground material in the vomitus. Patient denies any stomach pain. Patient states has had normal bowel movements no significant constipation or diarrhea. Patient  denies any dysuria, hematuria or significant nocturia. Patient denies any problems walking, swelling in the joints or loss of balance. Patient denies any skin changes, loss of hair or loss of weight.  Patient denies any excessive worrying or anxiety or significant depression. Patient denies any problems with insomnia. Patient denies excessive thirst, polyuria, polydipsia. Patient denies any swollen glands, patient denies easy bruising or easy bleeding. Patient denies any recent infections, allergies or URI. Patient "s visual fields have not changed significantly in recent time.    PHYSICAL EXAM: BP 127/87   Pulse 97   Temp 97.6 F (36.4 C)   Resp (!) 22   Wt 109 lb 3.8 oz (49.6 kg)   BMI 19.35 kg/m  Well-developed well-nourished patient in NAD. HEENT reveals PERLA, EOMI, discs not visualized.  Oral cavity is clear. No oral mucosal lesions are identified. Neck is clear without evidence of cervical or supraclavicular adenopathy. Lungs are clear to A&P. Cardiac examination is essentially unremarkable with regular rate and rhythm without murmur rub or thrill. Abdomen is benign with no organomegaly or masses noted. Motor sensory and DTR levels are equal and symmetric in the upper and lower extremities. Cranial nerves II through XII are grossly intact. Proprioception is intact. No peripheral adenopathy or edema is identified. No motor or sensory levels are noted. Crude visual fields are within normal range.  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 These are extremely small lesions would not recommend a biopsy at this time should they progress over the next 2-3 months may offer SB RT to these lesions. I have set up a follow-up appointment in 2-3 months and a follow-up CT scan prior to her visit. I will discuss the case personally with medical  oncology but would favor continuing with immunotherapy at this time. Patient comprehend my treatment plan well appointments for follow-up and CT scans with contrast of the chest were arranged.  I would like to take this opportunity to thank you for allowing me to participate in the care of your patient.Armstead Peaks., MD

## 2016-01-10 ENCOUNTER — Inpatient Hospital Stay: Payer: Medicare Other

## 2016-01-10 ENCOUNTER — Encounter (INDEPENDENT_AMBULATORY_CARE_PROVIDER_SITE_OTHER): Payer: Self-pay

## 2016-01-10 ENCOUNTER — Inpatient Hospital Stay (HOSPITAL_BASED_OUTPATIENT_CLINIC_OR_DEPARTMENT_OTHER): Payer: Medicare Other | Admitting: Internal Medicine

## 2016-01-10 VITALS — BP 122/71 | HR 98 | Temp 98.6°F | Resp 20 | Ht 63.0 in | Wt 117.0 lb

## 2016-01-10 DIAGNOSIS — Z9221 Personal history of antineoplastic chemotherapy: Secondary | ICD-10-CM

## 2016-01-10 DIAGNOSIS — C7801 Secondary malignant neoplasm of right lung: Secondary | ICD-10-CM

## 2016-01-10 DIAGNOSIS — R7989 Other specified abnormal findings of blood chemistry: Secondary | ICD-10-CM

## 2016-01-10 DIAGNOSIS — F17210 Nicotine dependence, cigarettes, uncomplicated: Secondary | ICD-10-CM

## 2016-01-10 DIAGNOSIS — Z79899 Other long term (current) drug therapy: Secondary | ICD-10-CM

## 2016-01-10 DIAGNOSIS — C3431 Malignant neoplasm of lower lobe, right bronchus or lung: Secondary | ICD-10-CM | POA: Diagnosis not present

## 2016-01-10 DIAGNOSIS — Z85118 Personal history of other malignant neoplasm of bronchus and lung: Secondary | ICD-10-CM

## 2016-01-10 DIAGNOSIS — Z923 Personal history of irradiation: Secondary | ICD-10-CM

## 2016-01-10 DIAGNOSIS — R1310 Dysphagia, unspecified: Secondary | ICD-10-CM

## 2016-01-10 LAB — COMPREHENSIVE METABOLIC PANEL
ALT: 27 U/L (ref 14–54)
AST: 63 U/L — AB (ref 15–41)
Albumin: 3.1 g/dL — ABNORMAL LOW (ref 3.5–5.0)
Alkaline Phosphatase: 126 U/L (ref 38–126)
Anion gap: 7 (ref 5–15)
BUN: 10 mg/dL (ref 6–20)
CALCIUM: 8.3 mg/dL — AB (ref 8.9–10.3)
CHLORIDE: 109 mmol/L (ref 101–111)
CO2: 21 mmol/L — AB (ref 22–32)
Creatinine, Ser: 0.46 mg/dL (ref 0.44–1.00)
Glucose, Bld: 117 mg/dL — ABNORMAL HIGH (ref 65–99)
POTASSIUM: 2.9 mmol/L — AB (ref 3.5–5.1)
SODIUM: 137 mmol/L (ref 135–145)
TOTAL PROTEIN: 8.3 g/dL — AB (ref 6.5–8.1)
Total Bilirubin: 0.2 mg/dL — ABNORMAL LOW (ref 0.3–1.2)

## 2016-01-10 LAB — CBC WITH DIFFERENTIAL/PLATELET
BASOS ABS: 0.1 10*3/uL (ref 0–0.1)
BASOS PCT: 1 %
EOS ABS: 0.1 10*3/uL (ref 0–0.7)
EOS PCT: 2 %
HCT: 24.5 % — ABNORMAL LOW (ref 35.0–47.0)
Hemoglobin: 8.2 g/dL — ABNORMAL LOW (ref 12.0–16.0)
LYMPHS PCT: 30 %
Lymphs Abs: 1.5 10*3/uL (ref 1.0–3.6)
MCH: 23.9 pg — ABNORMAL LOW (ref 26.0–34.0)
MCHC: 33.4 g/dL (ref 32.0–36.0)
MCV: 71.5 fL — AB (ref 80.0–100.0)
MONO ABS: 0.6 10*3/uL (ref 0.2–0.9)
Monocytes Relative: 12 %
Neutro Abs: 2.8 10*3/uL (ref 1.4–6.5)
Neutrophils Relative %: 55 %
PLATELETS: 262 10*3/uL (ref 150–440)
RBC: 3.42 MIL/uL — AB (ref 3.80–5.20)
RDW: 22.7 % — AB (ref 11.5–14.5)
WBC: 5 10*3/uL (ref 3.6–11.0)

## 2016-01-10 LAB — TSH: TSH: 0.932 u[IU]/mL (ref 0.350–4.500)

## 2016-01-10 MED ORDER — SODIUM CHLORIDE 0.9 % IV SOLN
240.0000 mg | Freq: Once | INTRAVENOUS | Status: AC
  Administered 2016-01-10: 240 mg via INTRAVENOUS
  Filled 2016-01-10: qty 24

## 2016-01-10 MED ORDER — HEPARIN SOD (PORK) LOCK FLUSH 100 UNIT/ML IV SOLN
500.0000 [IU] | Freq: Once | INTRAVENOUS | Status: AC
Start: 2016-01-10 — End: 2016-01-10
  Administered 2016-01-10: 500 [IU] via INTRAVENOUS
  Filled 2016-01-10: qty 5

## 2016-01-10 MED ORDER — POTASSIUM CHLORIDE CRYS ER 20 MEQ PO TBCR: EXTENDED_RELEASE_TABLET | ORAL | 3 refills | Status: DC

## 2016-01-10 MED ORDER — SODIUM CHLORIDE 0.9% FLUSH
10.0000 mL | INTRAVENOUS | Status: DC | PRN
  Administered 2016-01-10: 10 mL via INTRAVENOUS
  Filled 2016-01-10: qty 10

## 2016-01-10 MED ORDER — SODIUM CHLORIDE 0.9 % IV SOLN
Freq: Once | INTRAVENOUS | Status: AC
  Administered 2016-01-10: 14:00:00 via INTRAVENOUS
  Filled 2016-01-10: qty 1000

## 2016-01-10 NOTE — Progress Notes (Signed)
Pt does not take any medications. She has started on ensure/boost this week.

## 2016-01-10 NOTE — Assessment & Plan Note (Addendum)
#   Right lung ca- with recurrence [based on imaging]- metastases to right lung ~10-35m   # Start OPDIVO q 2 w.  I discussed the mechanism of action; The goal of therapy is palliative; and length of treatments are likely ongoing/based upon the results of the scans. Discussed the potential side effects of immunotherapy including but not limited to diarrhea; skin rash; elevated LFTs/endocrine abnormalities etc.   # Dysphagia- radiation stricture s/p dilatation- Jan 2017 [Dr.Oh] recommend esophagogram. weight loss-sec to #1. Recommend ensure  # Hypokalemia potassium 2.9 recommend Kader 20 twice a day.  # alcohol abuse- ? Elevated LFts. Monitor closely.   # follow up in 2 weeks/ opdivo/ labs.

## 2016-01-10 NOTE — Progress Notes (Signed)
[facility] OFFICE PROGRESS NOTE  Patient Care Team: [place] as PCP - General (General Practice)  No matching staging information was found for the patient.   Oncology History   Chief Complaint/Diagnosis:   1. 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 Faith Rogue, cardiothoracic surgeon) 2. Started on radiation and concurrent chemotherapy with carboplatinum Taxol from April of 2013 3. Finished radiation and chemotherapy with carboplatinum and Taxol in May of 2013 4. Stage IIIB (T4N3M0) NSCLC who completed a course of EBRT on Oct 07, 2011 with a total dose of 7000cGy.   5. Starting consolidation with 2 cycles of carboplatinum and Abraxane 6.patient is off chemotherapy since November of 2013.  Repeat CT scan shows stable disease(January, 2014) 7  Started been on Taxotere and  RAMICIRUMAB from June 07, 2013 8.chemotherapy with Taxotere  and  RAMUCIRUMAB discontinued in March of 2015 because of hemoptysis 9.Started [facility] April of 2015. 10NIVOLULAMABWas put on hold because of abnormal liver enzymes January of 2016  # AUG 2017- Right lung recurrence [2 nodules - ~1cm; stable lung mass/fibrosis]. START OPDIVO q 2W [small to Bx; s/p rad-onc eval]  # MOLECULAR TESTING- ? Not enough tissue.      Carcinoma of lung ([place])   10/12/2014 Initial Diagnosis    Carcinoma of lung       Primary cancer of bronchus of right lower lobe ([place])   12/27/2015 Initial Diagnosis    Primary cancer of bronchus of right lower lobe ([place])        INTERVAL HISTORY:  Jeanette Jones 60 y.o.  female pleasant patient above history of stage IV lung cancer squamous cell/ recurrent- is here to start treatment with Opdivo.  Patient has been evaluated by radiation oncology; defer radiation at this time. Denies any nausea vomiting.  Does admit to chronic shortness of  breath. Chronic cough. No hemoptysis. No headaches.  REVIEW OF SYSTEMS:  A complete 10 point review of system is done which is negative except mentioned above/history of present illness.   PAST MEDICAL HISTORY :  Past Medical History:  Diagnosis Date  . Back pain, chronic   . Carcinoma of lung ([place]) 10/12/2014  . Depression   . Emphysema of lung ([place])   . Heart murmur   . Hep C w/o coma, chronic ([place])   . Lung cancer ([place])   . Swallowing difficulty     PAST SURGICAL HISTORY :   Past Surgical History:  Procedure Laterality Date  . ESOPHAGOGASTRODUODENOSCOPY N/A 06/08/2015   Procedure: ESOPHAGOGASTRODUODENOSCOPY (EGD);  Surgeon: Hulen Luster, MD;  Location: [facility] ENDOSCOPY;  Service: Gastroenterology;  Laterality: N/A;  . HEMORROIDECTOMY    . LUNG BIOPSY    . TUBAL LIGATION      FAMILY HISTORY :   Family History  Problem Relation Age of Onset  . Breast cancer Neg Hx     SOCIAL HISTORY:   Social History  Substance Use Topics  . Smoking status: Current Every Day Smoker    Packs/day: 0.25    Types: Cigarettes  . Smokeless tobacco: Not on file  . Alcohol use Not on file    ALLERGIES:  has No Known Allergies.  MEDICATIONS:  Current Outpatient Prescriptions  Medication Sig Dispense Refill  . potassium chloride SA (K-DUR,KLOR-CON) 20 MEQ tablet One pill twice a day; can crush and take with apple sauce. 60 tablet 3  No current facility-administered medications for this visit.    Facility-Administered Medications Ordered in Other Visits  Medication Dose Route Frequency Provider Last Rate Last Dose  . sodium chloride flush (NS) 0.9 % injection 10 mL  10 mL Intravenous PRN Cammie Sickle, MD   10 mL at 01/10/16 1310    PHYSICAL EXAMINATION: ECOG PERFORMANCE STATUS: 0 - Asymptomatic  BP 122/71 (Patient Position: Sitting)   Pulse 98   Temp 98.6 F (37 C) (Tympanic)   Resp 20   Ht '5\' 3"'$  (1.6 m)   Wt 117 lb (53.1 kg)   BMI 20.73 kg/m   Filed Weights   01/10/16  1334  Weight: 117 lb (53.1 kg)    GENERAL: Well-nourished well-developed; Alert, no distress and comfortable.   Alone.  EYES: no pallor or icterus OROPHARYNX: no thrush or ulceration; good dentition  NECK: supple, no masses felt LYMPH:  no palpable lymphadenopathy in the cervical, axillary or inguinal regions LUNGS: clear to auscultation and  No wheeze or crackles HEART/CVS: regular rate & rhythm and no murmurs; No lower extremity edema ABDOMEN:abdomen soft, non-tender and normal bowel sounds Musculoskeletal:no cyanosis of digits and no clubbing  PSYCH: alert & oriented x 3 with fluent speech NEURO: no focal motor/sensory deficits SKIN:  no rashes or significant lesions  LABORATORY DATA:  I have reviewed the data as listed    Component Value Date/Time   NA 137 01/10/2016 1310   NA 135 08/29/2014 1457   K 2.9 (L) 01/10/2016 1310   K 3.7 08/29/2014 1457   CL 109 01/10/2016 1310   CL 105 08/29/2014 1457   CO2 21 (L) 01/10/2016 1310   CO2 24 08/29/2014 1457   GLUCOSE 117 (H) 01/10/2016 1310   GLUCOSE 98 08/29/2014 1457   BUN 10 01/10/2016 1310   BUN 8 08/29/2014 1457   CREATININE 0.46 01/10/2016 1310   CREATININE 0.64 08/29/2014 1457   CALCIUM 8.3 (L) 01/10/2016 1310   CALCIUM 9.0 08/29/2014 1457   PROT 8.3 (H) 01/10/2016 1310   PROT 9.3 (H) 08/29/2014 1457   ALBUMIN 3.1 (L) 01/10/2016 1310   ALBUMIN 3.6 08/29/2014 1457   AST 63 (H) 01/10/2016 1310   AST 111 (H) 08/29/2014 1457   ALT 27 01/10/2016 1310   ALT 78 (H) 08/29/2014 1457   ALKPHOS 126 01/10/2016 1310   ALKPHOS 168 (H) 08/29/2014 1457   BILITOT 0.2 (L) 01/10/2016 1310   BILITOT 0.6 08/29/2014 1457   GFRNONAA >60 01/10/2016 1310   GFRNONAA >60 08/29/2014 1457   GFRAA >60 01/10/2016 1310   GFRAA >60 08/29/2014 1457    No results found for: SPEP, UPEP  Lab Results  Component Value Date   WBC 5.0 01/10/2016   NEUTROABS 2.8 01/10/2016   HGB 8.2 (L) 01/10/2016   HCT 24.5 (L) 01/10/2016   MCV 71.5 (L)  01/10/2016   PLT 262 01/10/2016      Chemistry      Component Value Date/Time   NA 137 01/10/2016 1310   NA 135 08/29/2014 1457   K 2.9 (L) 01/10/2016 1310   K 3.7 08/29/2014 1457   CL 109 01/10/2016 1310   CL 105 08/29/2014 1457   CO2 21 (L) 01/10/2016 1310   CO2 24 08/29/2014 1457   BUN 10 01/10/2016 1310   BUN 8 08/29/2014 1457   CREATININE 0.46 01/10/2016 1310   CREATININE 0.64 08/29/2014 1457      Component Value Date/Time   CALCIUM 8.3 (L) 01/10/2016 1310   CALCIUM  9.0 08/29/2014 1457   ALKPHOS 126 01/10/2016 1310   ALKPHOS 168 (H) 08/29/2014 1457   AST 63 (H) 01/10/2016 1310   AST 111 (H) 08/29/2014 1457   ALT 27 01/10/2016 1310   ALT 78 (H) 08/29/2014 1457   BILITOT 0.2 (L) 01/10/2016 1310   BILITOT 0.6 08/29/2014 1457      IMPRESSION: 1. Interval growth of irregular solid 1.0 cm pulmonary nodules in the basilar right upper lobe and central right lower lobe, most suggestive of pulmonary metastases. 2. Stable masslike fibrosis in the right upper and medial right lower lobes. 3. New patchy ground-glass opacity in the left lower lobe, probably inflammatory, recommend attention on follow-up chest CT. 4. Stable mild subcarinal lymphadenopathy, nodal metastasis not excluded. 5. Additional findings include aortic atherosclerosis and 2 vessel coronary atherosclerosis.   Electronically Signed   By: Ilona Sorrel M.D.   On: 12/24/2015 13:47  RADIOGRAPHIC STUDIES: I have personally reviewed the radiological images as listed and agreed with the findings in the report. No results found.   ASSESSMENT & PLAN:  Primary cancer of bronchus of right lower lobe ([place]) # Right lung ca- with recurrence [based on imaging]- metastases to right lung ~10-26m   # Start OPDIVO q 2 w.  I discussed the mechanism of action; The goal of therapy is palliative; and length of treatments are likely ongoing/based upon the results of the scans. Discussed the potential side effects of  immunotherapy including but not limited to diarrhea; skin rash; elevated LFTs/endocrine abnormalities etc.   # Dysphagia- radiation stricture s/p dilatation- Jan 2017 [Dr.Oh] recommend esophagogram. weight loss-sec to #1. Recommend ensure  # Hypokalemia potassium 2.9 recommend Kader 20 twice a day.  # alcohol abuse- ? Elevated LFts. Monitor closely.   # follow up in 2 weeks/ opdivo/ labs.     Orders Placed This Encounter  Procedures  . CBC with Differential    Standing Status:   Standing    Number of Occurrences:   12    Standing Expiration Date:   01/09/2017  . Comprehensive metabolic panel    Standing Status:   Standing    Number of Occurrences:   12    Standing Expiration Date:   01/09/2017         GCammie Sickle MD 01/10/2016 5:17 PM

## 2016-01-13 ENCOUNTER — Telehealth: Payer: Self-pay | Admitting: *Deleted

## 2016-01-13 NOTE — Telephone Encounter (Signed)
-----   Message from Cammie Sickle, MD sent at 01/10/2016  7:14 PM EDT ----- Please call pt and make sure pt is taking her potassium pills as recommended- thx

## 2016-01-13 NOTE — Telephone Encounter (Signed)
Called patient to inquire if she is taking her K+.  She states she is taking them as prescribed.

## 2016-01-24 ENCOUNTER — Encounter: Payer: Self-pay | Admitting: *Deleted

## 2016-01-24 ENCOUNTER — Inpatient Hospital Stay: Payer: Medicare Other

## 2016-01-24 ENCOUNTER — Inpatient Hospital Stay: Payer: Medicare Other | Attending: Internal Medicine | Admitting: Internal Medicine

## 2016-01-24 VITALS — BP 122/74 | HR 108 | Temp 98.4°F | Resp 20 | Ht 63.0 in | Wt 112.8 lb

## 2016-01-24 DIAGNOSIS — F1010 Alcohol abuse, uncomplicated: Secondary | ICD-10-CM | POA: Diagnosis not present

## 2016-01-24 DIAGNOSIS — R7989 Other specified abnormal findings of blood chemistry: Secondary | ICD-10-CM | POA: Insufficient documentation

## 2016-01-24 DIAGNOSIS — I700 Atherosclerosis of aorta: Secondary | ICD-10-CM | POA: Insufficient documentation

## 2016-01-24 DIAGNOSIS — R1310 Dysphagia, unspecified: Secondary | ICD-10-CM | POA: Insufficient documentation

## 2016-01-24 DIAGNOSIS — D509 Iron deficiency anemia, unspecified: Secondary | ICD-10-CM | POA: Insufficient documentation

## 2016-01-24 DIAGNOSIS — C3431 Malignant neoplasm of lower lobe, right bronchus or lung: Secondary | ICD-10-CM | POA: Insufficient documentation

## 2016-01-24 DIAGNOSIS — Z79899 Other long term (current) drug therapy: Secondary | ICD-10-CM | POA: Diagnosis not present

## 2016-01-24 DIAGNOSIS — C7801 Secondary malignant neoplasm of right lung: Secondary | ICD-10-CM | POA: Diagnosis not present

## 2016-01-24 DIAGNOSIS — J449 Chronic obstructive pulmonary disease, unspecified: Secondary | ICD-10-CM | POA: Diagnosis not present

## 2016-01-24 DIAGNOSIS — F17210 Nicotine dependence, cigarettes, uncomplicated: Secondary | ICD-10-CM | POA: Diagnosis not present

## 2016-01-24 DIAGNOSIS — Z5111 Encounter for antineoplastic chemotherapy: Secondary | ICD-10-CM | POA: Insufficient documentation

## 2016-01-24 LAB — COMPREHENSIVE METABOLIC PANEL
ALBUMIN: 3.3 g/dL — AB (ref 3.5–5.0)
ALT: 25 U/L (ref 14–54)
AST: 46 U/L — ABNORMAL HIGH (ref 15–41)
Alkaline Phosphatase: 166 U/L — ABNORMAL HIGH (ref 38–126)
Anion gap: 5 (ref 5–15)
BILIRUBIN TOTAL: 0.3 mg/dL (ref 0.3–1.2)
BUN: 9 mg/dL (ref 6–20)
CALCIUM: 8.7 mg/dL — AB (ref 8.9–10.3)
CO2: 19 mmol/L — ABNORMAL LOW (ref 22–32)
CREATININE: 0.57 mg/dL (ref 0.44–1.00)
Chloride: 107 mmol/L (ref 101–111)
Glucose, Bld: 105 mg/dL — ABNORMAL HIGH (ref 65–99)
Potassium: 3.5 mmol/L (ref 3.5–5.1)
Sodium: 131 mmol/L — ABNORMAL LOW (ref 135–145)
Total Protein: 9.6 g/dL — ABNORMAL HIGH (ref 6.5–8.1)

## 2016-01-24 LAB — CBC WITH DIFFERENTIAL/PLATELET
BASOS PCT: 1 %
Basophils Absolute: 0.1 10*3/uL (ref 0–0.1)
EOS PCT: 1 %
Eosinophils Absolute: 0 10*3/uL (ref 0–0.7)
HEMATOCRIT: 28.4 % — AB (ref 35.0–47.0)
Hemoglobin: 9.3 g/dL — ABNORMAL LOW (ref 12.0–16.0)
Lymphocytes Relative: 32 %
Lymphs Abs: 1.7 10*3/uL (ref 1.0–3.6)
MCH: 23.1 pg — ABNORMAL LOW (ref 26.0–34.0)
MCHC: 32.6 g/dL (ref 32.0–36.0)
MCV: 70.8 fL — ABNORMAL LOW (ref 80.0–100.0)
MONO ABS: 0.7 10*3/uL (ref 0.2–0.9)
MONOS PCT: 14 %
NEUTROS ABS: 2.8 10*3/uL (ref 1.4–6.5)
Neutrophils Relative %: 52 %
PLATELETS: 382 10*3/uL (ref 150–440)
RBC: 4.02 MIL/uL (ref 3.80–5.20)
RDW: 22.1 % — AB (ref 11.5–14.5)
WBC: 5.3 10*3/uL (ref 3.6–11.0)

## 2016-01-24 MED ORDER — IPRATROPIUM-ALBUTEROL 0.5-2.5 (3) MG/3ML IN SOLN: 3.0000 mL | RESPIRATORY_TRACT | 3 refills | Status: DC | PRN

## 2016-01-24 MED ORDER — TRAMADOL HCL 50 MG PO TABS: 50.0000 mg | ORAL_TABLET | Freq: Three times a day (TID) | ORAL | 0 refills | Status: DC | PRN

## 2016-01-24 MED ORDER — SODIUM CHLORIDE 0.9% FLUSH
10.0000 mL | Freq: Once | INTRAVENOUS | Status: AC
  Administered 2016-01-24: 10 mL via INTRAVENOUS
  Filled 2016-01-24: qty 10

## 2016-01-24 MED ORDER — HEPARIN SOD (PORK) LOCK FLUSH 100 UNIT/ML IV SOLN
500.0000 [IU] | Freq: Once | INTRAVENOUS | Status: AC
  Administered 2016-01-24: 500 [IU] via INTRAVENOUS

## 2016-01-24 MED ORDER — SODIUM CHLORIDE 0.9 % IV SOLN
240.0000 mg | Freq: Once | INTRAVENOUS | Status: AC
  Administered 2016-01-24: 240 mg via INTRAVENOUS
  Filled 2016-01-24: qty 4

## 2016-01-24 MED ORDER — SODIUM CHLORIDE 0.9 % IV SOLN
Freq: Once | INTRAVENOUS | Status: AC
  Administered 2016-01-24: 15:00:00 via INTRAVENOUS
  Filled 2016-01-24: qty 1000

## 2016-01-24 NOTE — Assessment & Plan Note (Addendum)
#   Right lung ca- with recurrence [based on imaging]- metastases to right lung ~10-12m on OPDIVO s/p #1.   # proceed with #2 today. Tolerating fairly well.   # Chest wall pain- sec to scar tissue/pleuritic.  tramadol 50 q 8hr.  # Dysphagia- radiation stricture s/p dilatation- Jan 2017 [Dr.Oh] recommend esophagogram- missed appt; will re-schedule. weight loss-sec to #1. Recommend ensure  # COPD- reocmmend nebs/ meds.   # microcytic anemia- Hb- 9.8- check Iron studies/ferrtin.   # alcohol abuse- ? Elevated LFts. Monitor closely.   # follow up in 2 weeks/ opdivo/ labs.

## 2016-01-24 NOTE — Patient Instructions (Signed)
Albuterol; Ipratropium solution for inhalation What is this medicine? ALBUTEROL; IPRATROPIUM (al BYOO ter ole; i pra TROE pee um) has two bronchodilators. It helps open up the airways in your lungs to make it easier to breathe. This medicine is used to treat chronic obstructive pulmonary disease (COPD). This medicine may be used for other purposes; ask your health care provider or pharmacist if you have questions. What should I tell my health care provider before I take this medicine? They need to know if you have any of the following conditions: -heart disease -high blood pressure -irregular heartbeat -an unusual or allergic reaction to albuterol, ipratropium, atropine, soya protein, soybeans or peanuts, other medicines, foods, dyes, or preservatives -pregnant or trying to get pregnant -breast-feeding How should I use this medicine? This medicine is used in a nebulizer. Nebulizers make a liquid into an aerosol that you breathe in through your mouth or your mouth and nose into your lungs. You will be taught how to use your nebulizer. Follow the directions on your prescription label. Take your medicine at regular intervals. Do not use more often than directed. Talk to your pediatrician regarding the use of this medicine in children. Special care may be needed. Overdosage: If you think you have taken too much of this medicine contact a poison control center or emergency room at once. NOTE: This medicine is only for you. Do not share this medicine with others. What if I miss a dose? If you miss a dose, use it as soon as you can. If it is almost time for your next dose, use only that dose. Do not use double or extra doses. What may interact with this medicine? Do not take this medicine with any of the following medications: -MAOIs like Carbex, Eldepryl, Marplan, Nardil, and Parnate This medicine may also interact with the following medications: -diuretics -medicines for depression, anxiety, or  psychotic disturbances -medicines for irregular heartbeat -medicines for weight loss including some herbal products -methadone -pimozide -some medicines for blood pressure or the heart -sertindole This list may not describe all possible interactions. Give your health care provider a list of all the medicines, herbs, non-prescription drugs, or dietary supplements you use. Also tell them if you smoke, drink alcohol, or use illegal drugs. Some items may interact with your medicine. What should I watch for while using this medicine? Tell your doctor or healthcare professional if your symptoms do not start to get better or if they get worse. If your breathing gets worse while you are using this medicine, call your doctor right away. Do not stop using your medicine unless your doctor tells you to. Your mouth may get dry. Chewing sugarless gum or sucking hard candy, and drinking plenty of water may help. Contact your doctor if the problem does not go away or is severe. You may get dizzy or have blurred vision. Do not drive, use machinery, or do anything that needs mental alertness until you know how this medicine affects you. Do not stand or sit up quickly, especially if you are an older patient. This reduces the risk of dizzy or fainting spells. What side effects may I notice from receiving this medicine? Side effects that you should report to your doctor or health care professional as soon as possible: -allergic reactions like skin rash, itching or hives, swelling of the face, lips, or tongue -breathing problems -feeling faint or lightheaded, falls -fever -high blood pressure -irregular heartbeat or chest pain -muscle cramps or weakness -pain, tingling, numbness in  the hands or feet -vomiting Side effects that usually do not require medical attention (report to your doctor or health care professional if they continue or are bothersome): -blurred vision -cough -difficulty passing  urine -difficulty sleeping -headache -nervousness or trembling -stuffy or runny nose -unusual taste -upset stomach This list may not describe all possible side effects. Call your doctor for medical advice about side effects. You may report side effects to FDA at 1-800-FDA-1088. Where should I keep my medicine? Keep out of the reach of children. Store at a room temperature 2 and 30 degees C (36 to 86 degrees F). Protect from light. Store this medicine in the protective pouch until ready to use. Throw away any unused medicine after the expiration date. NOTE: This sheet is a summary. It may not cover all possible information. If you have questions about this medicine, talk to your doctor, pharmacist, or health care provider.    2016, Elsevier/Gold Standard. (2009-12-31 15:40:09)       How to Use a Nebulizer If you have asthma or other breathing problems, you might need to breathe in (inhale) medicine. This can be done with a nebulizer. A nebulizer is a device that turns liquid medicine into a mist that you can inhale.  There are different kinds of nebulizers. Most are small. With some, you breathe in through a mouthpiece. With others, a mask fits over your nose and mouth. Most nebulizers must be connected to a small air compressor. Air is forced through tubing from the compressor to the nebulizer. The forced air changes the liquid into a fine spray. RISKS AND COMPLICATIONS The nebulizer must work properly for it to help your breathing. If the nebulizer does not produce mist, or if foam comes out, this indicates that the nebulizer is not working properly. Sometimes a filter can get clogged, or there might be a problem with the air compressor. Check the instruction booklet that came with your nebulizer. It should tell you how to fix problems or where to call for help. You should have at least one extra nebulizer at home. That way, you will always have one when you need it.  HOW TO PREPARE BEFORE  USING THE NEBULIZER Take these steps before using the nebulizer: 1. Check your medicine. Make sure it has not expired and is not damaged in any way.  2. Wash your hands with soap and water.  3. Put all the parts of your nebulizer on a sturdy, flat surface. Make sure the tubing connects the compressor and the nebulizer. 4. Measure the liquid medicine according to your health care provider's instructions. Pour it into the nebulizer. 5. Attach the mouthpiece or mask.  6. Test the nebulizer by turning it on to make sure a spray is coming out. Then, turn it off.  HOW TO USE THE NEBULIZER 1. Sit down and focus on staying relaxed.  2. If your nebulizer has a mask, put it over your nose and mouth. If you use a mouthpiece, put it in your mouth. Press your lips firmly around the mouthpiece. 3. Turn on the nebulizer.  4. Breathe out.  5. Some nebulizers have a finger valve. If yours does, cover up the air hole so the air gets to the nebulizer. 6. Once the medicine begins to mist out, take slow, deep breaths. If there is a finger valve, release it at the end of your breath. 7. Continue taking slow, deep breaths until the nebulizer is empty.  Be sure to stop the machine  at any point if you start coughing or if the medicine foams or bubbles. HOW TO CLEAN THE NEBULIZER  The nebulizer and all its parts must be kept very clean. Follow the manufacturer's instructions for cleaning. For most nebulizers, you should follow these guidelines:  Wash the nebulizer after each use. Use warm water and soap. Rinse it well. Shake the nebulizer to remove extra water. Put it on a clean towel until it is completely dry. To make sure it is dry, put the nebulizer back together. Turn on the compressor for a few minutes. This will blow air through the nebulizer.   Do not wash the tubing or the finger valve.   Store the nebulizer in a dust-free place.   Inspect the filter every week. Replace it any time it looks  dirty.   Sometimes the nebulizer will need a more complete cleaning. The instruction booklet should say how often you need to do this. SEEK MEDICAL CARE IF:   You continue to have difficulty breathing.   You have trouble using the nebulizer.    This information is not intended to replace advice given to you by your health care provider. Make sure you discuss any questions you have with your health care provider.   Document Released: 04/22/2009 Document Revised: 05/25/2014 Document Reviewed: 10/24/2012 Elsevier Interactive Patient Education [employer].

## 2016-01-24 NOTE — Progress Notes (Signed)
Pt c/o coughing up bright red blood in morning with mucus. Estimates less than a teaspoon. Coughing spells last approximately 20-25 mins.  "I hurt bad in my chest when I cough."  Pt c/o leg cramps.  Pt is not using any coughing suppressant.  Reports decreased appetite. 5 lb wt loss since last visit.  Pt uses 1-3 boost/ensure per day.  She reports 1 episode of loose stools/day.     Pt educated and provide verbal and written information on neubulizer machines, how to operate,  How often to perform breathing treatments. side effects of duoneb. Barriers to care: financial barriers.  Ref to be sent to [facility] Home care for nebulizer machine.

## 2016-01-24 NOTE — Progress Notes (Signed)
[place] OFFICE PROGRESS NOTE  Patient Care Team: [place] as PCP - General (General Practice)  No matching staging information was found for the patient.   Oncology History   Chief Complaint/Diagnosis:   1. 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 Faith Rogue, cardiothoracic surgeon) 2. Started on radiation and concurrent chemotherapy with carboplatinum Taxol from April of 2013 3. Finished radiation and chemotherapy with carboplatinum and Taxol in May of 2013 4. Stage IIIB (T4N3M0) NSCLC who completed a course of EBRT on Oct 07, 2011 with a total dose of 7000cGy.   5. Starting consolidation with 2 cycles of carboplatinum and Abraxane 6.patient is off chemotherapy since November of 2013.  Repeat CT scan shows stable disease(January, 2014) 7  Started been on Taxotere and  RAMICIRUMAB from June 07, 2013 8.chemotherapy with Taxotere  and  RAMUCIRUMAB discontinued in March of 2015 because of hemoptysis 9.Started [facility] April of 2015. 10NIVOLULAMABWas put on hold because of abnormal liver enzymes January of 2016  # AUG 2017- Right lung recurrence [2 nodules - ~1cm; stable lung mass/fibrosis]. START OPDIVO q 2W [small to Bx; s/p rad-onc eval]  # MOLECULAR TESTING- ? Not enough tissue.      Carcinoma of lung ([place])   10/12/2014 Initial Diagnosis    Carcinoma of lung       Primary cancer of bronchus of right lower lobe ([place])   12/27/2015 Initial Diagnosis    Primary cancer of bronchus of right lower lobe (HCC)        INTERVAL HISTORY:  Jeanette Jones 60 y.o.  female pleasant patient above history of stage IV lung cancer squamous cell/ recurrent- Patient currently on Opdivo status post cycle #1.  Does admit to chronic shortness of breath-not insignificant divorced since starting the Opdivo.. Chronic cough. mild hemoptysis- with sputum..  No headaches. Complains of right chest wall pain with coughing. Chronic difficult to swallowing not improve. Lost some weight. Did not get the esophagogram as ordered.  REVIEW OF SYSTEMS:  A complete 10 point review of system is done which is negative except mentioned above/history of present illness.   PAST MEDICAL HISTORY :  Past Medical History:  Diagnosis Date  . Back pain, chronic   . Carcinoma of lung ([place]) 10/12/2014  . Depression   . Emphysema of lung ([place])   . Heart murmur   . Hep C w/o coma, chronic ([place])   . Lung cancer ([place])   . Swallowing difficulty     PAST SURGICAL HISTORY :   Past Surgical History:  Procedure Laterality Date  . ESOPHAGOGASTRODUODENOSCOPY N/A 06/08/2015   Procedure: ESOPHAGOGASTRODUODENOSCOPY (EGD);  Surgeon: Hulen Luster, MD;  Location[facility] ENDOSCOPY;  Service: Gastroenterology;  Laterality: N/A;  . HEMORROIDECTOMY    . LUNG BIOPSY    . TUBAL LIGATION      FAMILY HISTORY :   Family History  Problem Relation Age of Onset  . Breast cancer Neg Hx     SOCIAL HISTORY:   Social History  Substance Use Topics  . Smoking status: Current Every Day Smoker    Packs/day: 0.25    Types: Cigarettes  . Smokeless tobacco: Not on file  . Alcohol use Not on file    ALLERGIES:  has No Known Allergies.  MEDICATIONS:  Current Outpatient Prescriptions  Medication Sig Dispense Refill  . potassium chloride SA (K-DUR,KLOR-CON) 20 MEQ tablet One  pill twice a day; can crush and take with apple sauce. 60 tablet 3  . ipratropium-albuterol (DUONEB) 0.5-2.5 (3) MG/3ML SOLN Take 3 mLs by nebulization every 4 (four) hours as needed. 360 mL 3  . traMADol (ULTRAM) 50 MG tablet Take 1 tablet (50 mg total) by mouth every 8 (eight) hours as needed. 45 tablet 0   No current facility-administered medications for this visit.    Facility-Administered Medications Ordered in Other Visits  Medication Dose Route Frequency Provider Last Rate Last Dose  . heparin lock flush 100  unit/mL  500 Units Intravenous Once Cammie Sickle, MD        PHYSICAL EXAMINATION: ECOG PERFORMANCE STATUS: 0 - Asymptomatic  BP 122/74   Pulse (!) 108   Temp 98.4 F (36.9 C) (Tympanic)   Resp 20   Ht '5\' 3"'$  (1.6 m)   Wt 112 lb 12.8 oz (51.2 kg)   BMI 19.98 kg/m   Filed Weights   01/24/16 1349  Weight: 112 lb 12.8 oz (51.2 kg)    GENERAL: Well-nourished well-developed; Alert, no distress and comfortable.   Alone.  EYES: no pallor or icterus OROPHARYNX: no thrush or ulceration; good dentition  NECK: supple, no masses felt LYMPH:  no palpable lymphadenopathy in the cervical, axillary or inguinal regions LUNGS: Decreased air entry to auscultation bilaterally and  No wheeze or crackles HEART/CVS: regular rate & rhythm and no murmurs; No lower extremity edema ABDOMEN:abdomen soft, non-tender and normal bowel sounds Musculoskeletal:no cyanosis of digits and no clubbing  PSYCH: alert & oriented x 3 with fluent speech NEURO: no focal motor/sensory deficits SKIN:  no rashes or significant lesions  LABORATORY DATA:  I have reviewed the data as listed    Component Value Date/Time   NA 131 (L) 01/24/2016 1336   NA 135 08/29/2014 1457   K 3.5 01/24/2016 1336   K 3.7 08/29/2014 1457   CL 107 01/24/2016 1336   CL 105 08/29/2014 1457   CO2 19 (L) 01/24/2016 1336   CO2 24 08/29/2014 1457   GLUCOSE 105 (H) 01/24/2016 1336   GLUCOSE 98 08/29/2014 1457   BUN 9 01/24/2016 1336   BUN 8 08/29/2014 1457   CREATININE 0.57 01/24/2016 1336   CREATININE 0.64 08/29/2014 1457   CALCIUM 8.7 (L) 01/24/2016 1336   CALCIUM 9.0 08/29/2014 1457   PROT 9.6 (H) 01/24/2016 1336   PROT 9.3 (H) 08/29/2014 1457   ALBUMIN 3.3 (L) 01/24/2016 1336   ALBUMIN 3.6 08/29/2014 1457   AST 46 (H) 01/24/2016 1336   AST 111 (H) 08/29/2014 1457   ALT 25 01/24/2016 1336   ALT 78 (H) 08/29/2014 1457   ALKPHOS 166 (H) 01/24/2016 1336   ALKPHOS 168 (H) 08/29/2014 1457   BILITOT 0.3 01/24/2016 1336    BILITOT 0.6 08/29/2014 1457   GFRNONAA >60 01/24/2016 1336   GFRNONAA >60 08/29/2014 1457   GFRAA >60 01/24/2016 1336   GFRAA >60 08/29/2014 1457    No results found for: SPEP, UPEP  Lab Results  Component Value Date   WBC 5.3 01/24/2016   NEUTROABS 2.8 01/24/2016   HGB 9.3 (L) 01/24/2016   HCT 28.4 (L) 01/24/2016   MCV 70.8 (L) 01/24/2016   PLT 382 01/24/2016      Chemistry      Component Value Date/Time   NA 131 (L) 01/24/2016 1336   NA 135 08/29/2014 1457   K 3.5 01/24/2016 1336   K 3.7 08/29/2014 1457   CL 107 01/24/2016 1336  CL 105 08/29/2014 1457   CO2 19 (L) 01/24/2016 1336   CO2 24 08/29/2014 1457   BUN 9 01/24/2016 1336   BUN 8 08/29/2014 1457   CREATININE 0.57 01/24/2016 1336   CREATININE 0.64 08/29/2014 1457      Component Value Date/Time   CALCIUM 8.7 (L) 01/24/2016 1336   CALCIUM 9.0 08/29/2014 1457   ALKPHOS 166 (H) 01/24/2016 1336   ALKPHOS 168 (H) 08/29/2014 1457   AST 46 (H) 01/24/2016 1336   AST 111 (H) 08/29/2014 1457   ALT 25 01/24/2016 1336   ALT 78 (H) 08/29/2014 1457   BILITOT 0.3 01/24/2016 1336   BILITOT 0.6 08/29/2014 1457      IMPRESSION: 1. Interval growth of irregular solid 1.0 cm pulmonary nodules in the basilar right upper lobe and central right lower lobe, most suggestive of pulmonary metastases. 2. Stable masslike fibrosis in the right upper and medial right lower lobes. 3. New patchy ground-glass opacity in the left lower lobe, probably inflammatory, recommend attention on follow-up chest CT. 4. Stable mild subcarinal lymphadenopathy, nodal metastasis not excluded. 5. Additional findings include aortic atherosclerosis and 2 vessel coronary atherosclerosis.   Electronically Signed   By: Ilona Sorrel M.D.   On: 12/24/2015 13:47  RADIOGRAPHIC STUDIES: I have personally reviewed the radiological images as listed and agreed with the findings in the report. No results found.   ASSESSMENT & PLAN:  Primary cancer of  bronchus of right lower lobe ([place]) # Right lung ca- with recurrence [based on imaging]- metastases to right lung ~10-69m on OPDIVO s/p #1.   # proceed with #2 today. Tolerating fairly well.   # Chest wall pain- sec to scar tissue/pleuritic.  tramadol 50 q 8hr.  # Dysphagia- radiation stricture s/p dilatation- Jan 2017 [Dr.Oh] recommend esophagogram- missed appt; will re-schedule. weight loss-sec to #1. Recommend ensure  # COPD- reocmmend nebs/ meds.   # microcytic anemia- Hb- 9.8- check Iron studies/ferrtin.   # alcohol abuse- ? Elevated LFts. Monitor closely.   # follow up in 2 weeks/ opdivo/ labs.     No orders of the defined types were placed in this encounter.     GCammie Sickle MD 01/24/2016 2:39 PM

## 2016-01-28 ENCOUNTER — Ambulatory Visit
Admission: RE | Admit: 2016-01-28 | Discharge: 2016-01-28 | Disposition: A | Discharge status: Home / Self Care | Payer: Medicare Other | Source: Ambulatory Visit | Attending: Internal Medicine | Admitting: Internal Medicine

## 2016-01-28 DIAGNOSIS — C3431 Malignant neoplasm of lower lobe, right bronchus or lung: Secondary | ICD-10-CM | POA: Diagnosis not present

## 2016-01-28 DIAGNOSIS — R1310 Dysphagia, unspecified: Secondary | ICD-10-CM

## 2016-01-28 DIAGNOSIS — K222 Esophageal obstruction: Secondary | ICD-10-CM | POA: Diagnosis not present

## 2016-02-07 ENCOUNTER — Telehealth: Payer: Self-pay | Admitting: Gastroenterology

## 2016-02-07 ENCOUNTER — Inpatient Hospital Stay: Payer: Medicare Other

## 2016-02-07 ENCOUNTER — Inpatient Hospital Stay (HOSPITAL_BASED_OUTPATIENT_CLINIC_OR_DEPARTMENT_OTHER): Payer: Medicare Other | Admitting: Internal Medicine

## 2016-02-07 ENCOUNTER — Ambulatory Visit: Payer: Medicare Other

## 2016-02-07 ENCOUNTER — Other Ambulatory Visit: Payer: Medicare Other

## 2016-02-07 ENCOUNTER — Ambulatory Visit: Payer: Medicare Other | Admitting: Internal Medicine

## 2016-02-07 ENCOUNTER — Encounter: Payer: Self-pay | Admitting: Internal Medicine

## 2016-02-07 VITALS — BP 132/89 | HR 90 | Temp 97.2°F | Resp 20 | Ht 63.0 in | Wt 114.6 lb

## 2016-02-07 DIAGNOSIS — D509 Iron deficiency anemia, unspecified: Secondary | ICD-10-CM

## 2016-02-07 DIAGNOSIS — F17210 Nicotine dependence, cigarettes, uncomplicated: Secondary | ICD-10-CM

## 2016-02-07 DIAGNOSIS — J449 Chronic obstructive pulmonary disease, unspecified: Secondary | ICD-10-CM

## 2016-02-07 DIAGNOSIS — C3431 Malignant neoplasm of lower lobe, right bronchus or lung: Secondary | ICD-10-CM | POA: Diagnosis not present

## 2016-02-07 DIAGNOSIS — R1310 Dysphagia, unspecified: Secondary | ICD-10-CM

## 2016-02-07 DIAGNOSIS — R7989 Other specified abnormal findings of blood chemistry: Secondary | ICD-10-CM

## 2016-02-07 DIAGNOSIS — I700 Atherosclerosis of aorta: Secondary | ICD-10-CM | POA: Diagnosis not present

## 2016-02-07 DIAGNOSIS — C7801 Secondary malignant neoplasm of right lung: Secondary | ICD-10-CM

## 2016-02-07 DIAGNOSIS — Z5111 Encounter for antineoplastic chemotherapy: Secondary | ICD-10-CM | POA: Diagnosis not present

## 2016-02-07 DIAGNOSIS — Z79899 Other long term (current) drug therapy: Secondary | ICD-10-CM

## 2016-02-07 DIAGNOSIS — F1010 Alcohol abuse, uncomplicated: Secondary | ICD-10-CM

## 2016-02-07 LAB — CBC WITH DIFFERENTIAL/PLATELET
Basophils Absolute: 0.1 10*3/uL (ref 0–0.1)
Basophils Relative: 1 %
Eosinophils Absolute: 0.1 10*3/uL (ref 0–0.7)
Eosinophils Relative: 1 %
HEMATOCRIT: 28.3 % — AB (ref 35.0–47.0)
HEMOGLOBIN: 9.3 g/dL — AB (ref 12.0–16.0)
LYMPHS ABS: 1.6 10*3/uL (ref 1.0–3.6)
Lymphocytes Relative: 22 %
MCH: 23.3 pg — AB (ref 26.0–34.0)
MCHC: 32.8 g/dL (ref 32.0–36.0)
MCV: 70.8 fL — ABNORMAL LOW (ref 80.0–100.0)
MONOS PCT: 11 %
Monocytes Absolute: 0.8 10*3/uL (ref 0.2–0.9)
NEUTROS ABS: 4.6 10*3/uL (ref 1.4–6.5)
NEUTROS PCT: 65 %
Platelets: 346 10*3/uL (ref 150–440)
RBC: 3.99 MIL/uL (ref 3.80–5.20)
RDW: 21.7 % — ABNORMAL HIGH (ref 11.5–14.5)
WBC: 7.1 10*3/uL (ref 3.6–11.0)

## 2016-02-07 LAB — COMPREHENSIVE METABOLIC PANEL
ALK PHOS: 144 U/L — AB (ref 38–126)
ALT: 24 U/L (ref 14–54)
ANION GAP: 4 — AB (ref 5–15)
AST: 49 U/L — ABNORMAL HIGH (ref 15–41)
Albumin: 3.3 g/dL — ABNORMAL LOW (ref 3.5–5.0)
BILIRUBIN TOTAL: 0.5 mg/dL (ref 0.3–1.2)
BUN: 15 mg/dL (ref 6–20)
CALCIUM: 8.8 mg/dL — AB (ref 8.9–10.3)
CO2: 22 mmol/L (ref 22–32)
Chloride: 111 mmol/L (ref 101–111)
Creatinine, Ser: 0.64 mg/dL (ref 0.44–1.00)
GFR calc non Af Amer: >60 mL/min (ref >60)
Glucose, Bld: 87 mg/dL (ref 65–99)
Potassium: 3.8 mmol/L (ref 3.5–5.1)
Sodium: 137 mmol/L (ref 135–145)
TOTAL PROTEIN: 9.2 g/dL — AB (ref 6.5–8.1)

## 2016-02-07 LAB — FERRITIN: Ferritin: 17 ng/mL (ref 11–307)

## 2016-02-07 LAB — IRON AND TIBC
IRON: 28 ug/dL (ref 28–170)
SATURATION RATIOS: 5 % — AB (ref 10.4–31.8)
TIBC: 552 ug/dL — ABNORMAL HIGH (ref 250–450)
UIBC: 524 ug/dL

## 2016-02-07 MED ORDER — SODIUM CHLORIDE 0.9 % IV SOLN
Freq: Once | INTRAVENOUS | Status: AC
  Administered 2016-02-07: 12:00:00 via INTRAVENOUS
  Filled 2016-02-07: qty 1000

## 2016-02-07 MED ORDER — SODIUM CHLORIDE 0.9% FLUSH
10.0000 mL | Freq: Once | INTRAVENOUS | Status: AC
  Administered 2016-02-07: 10 mL via INTRAVENOUS
  Filled 2016-02-07: qty 10

## 2016-02-07 MED ORDER — SODIUM CHLORIDE 0.9 % IV SOLN
240.0000 mg | Freq: Once | INTRAVENOUS | Status: AC
  Administered 2016-02-07: 240 mg via INTRAVENOUS
  Filled 2016-02-07: qty 20

## 2016-02-07 MED ORDER — HEPARIN SOD (PORK) LOCK FLUSH 100 UNIT/ML IV SOLN
500.0000 [IU] | Freq: Once | INTRAVENOUS | Status: AC
  Administered 2016-02-07: 500 [IU] via INTRAVENOUS
  Filled 2016-02-07: qty 5

## 2016-02-07 NOTE — Telephone Encounter (Signed)
EGD per [place]

## 2016-02-07 NOTE — Telephone Encounter (Signed)
Please get insurance from patient when you call to triage and enter it.

## 2016-02-07 NOTE — Assessment & Plan Note (Addendum)
#   Right lung ca- with recurrence [based on imaging]- metastases to right lung ~10-49m on OPDIVO s/p #2.   # proceed with #3  today. Tolerating fairly well. We will get a CT scan after cycle #4  # Chest wall pain- sec to scar tissue/pleuritic.  tramadol 50 q 8hr.  # Dysphagia- radiation stricture s/p dilatation- Jan 2017 [Dr.Oh]  esophagogram- smooth strciture- weight loss-sec to #1. Recommend ensure; reocmmend GI referra.   # COPD/bronchitis- reocmmend nebs/ meds. Tessalon pearls.   # microcytic anemia- Hb- 9.8- check Iron studies/ferrtin today.   # alcohol abuse- ? Elevated LFts. Monitor closely.   # follow up in 2 weeks/ opdivo/ labs.

## 2016-02-07 NOTE — Progress Notes (Signed)
Pt report she is having more in blood in the mornings in her cough, and currently has cold like symptoms with no fevers

## 2016-02-07 NOTE — Progress Notes (Signed)
[place] OFFICE PROGRESS NOTE  Patient Care Team: [place] as PCP - General (General Practice)  No matching staging information was found for the patient.   Oncology History   Chief Complaint/Diagnosis:   1. 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 Faith Rogue, cardiothoracic surgeon) 2. Started on radiation and concurrent chemotherapy with carboplatinum Taxol from April of 2013 3. Finished radiation and chemotherapy with carboplatinum and Taxol in May of 2013 4. Stage IIIB (T4N3M0) NSCLC who completed a course of EBRT on Oct 07, 2011 with a total dose of 7000cGy.   5. Starting consolidation with 2 cycles of carboplatinum and Abraxane 6.patient is off chemotherapy since November of 2013.  Repeat CT scan shows stable disease(January, 2014) 7  Started been on Taxotere and  RAMICIRUMAB from June 07, 2013 8.chemotherapy with Taxotere  and  RAMUCIRUMAB discontinued in March of 2015 because of hemoptysis 9.Started [facility] April of 2015. 10NIVOLULAMABWas put on hold because of abnormal liver enzymes January of 2016  # AUG 2017- Right lung recurrence [2 nodules - ~1cm; stable lung mass/fibrosis]. START OPDIVO q 2W [small to Bx; s/p rad-onc eval]  # MOLECULAR TESTING- ? Not enough tissue.      Carcinoma of lung ([place])   10/12/2014 Initial Diagnosis    Carcinoma of lung       Primary cancer of bronchus of right lower lobe ([place])   12/27/2015 Initial Diagnosis    Primary cancer of bronchus of right lower lobe (H[facility])        INTERVAL HISTORY:  Jeanette Jones 60 y.o.  female pleasant patient above history of stage IV lung cancer squamous cell/ recurrent- Patient currently on Opdivo status post cycle #2.  Patient continues to complain of chronic shortness of breath which has not gotten significantly worse; chronic mild hemoptysis. No headaches.  Complains of right chest wall pain with coughing-helped with tramadol. Chronic difficult to swallowing not improveD. Lost some weight.    REVIEW OF SYSTEMS:  A complete 10 point review of system is done which is negative except mentioned above/history of present illness.   PAST MEDICAL HISTORY :  Past Medical History:  Diagnosis Date  . Back pain, chronic   . Carcinoma of lung ([place]) 10/12/2014  . Cough   . Depression   . Dyspnea   . Emphysema of lung ([place])   . Heart murmur   . Hep C w/o coma, chronic ([place])   . Lung cancer ([place])   . Swallowing difficulty     PAST SURGICAL HISTORY :   Past Surgical History:  Procedure Laterality Date  . ESOPHAGOGASTRODUODENOSCOPY N/A 06/08/2015   Procedure: ESOPHAGOGASTRODUODENOSCOPY (EGD);  Surgeon: Hulen Luster, MD;  Location: [facility] ENDOSCOPY;  Service: Gastroenterology;  Laterality: N/A;  . HEMORROIDECTOMY    . LUNG BIOPSY    . TUBAL LIGATION      FAMILY HISTORY :   Family History  Problem Relation Age of Onset  . Breast cancer Neg Hx     SOCIAL HISTORY:   Social History  Substance Use Topics  . Smoking status: Current Every Day Smoker    Packs/day: 0.25    Types: Cigarettes  . Smokeless tobacco: Never Used  . Alcohol use Not on file    ALLERGIES:  has No Known Allergies.  MEDICATIONS:  Current Outpatient Prescriptions  Medication Sig Dispense Refill  . ipratropium-albuterol (DUONEB) 0.5-2.5 (3) MG/3ML  SOLN Take 3 mLs by nebulization every 4 (four) hours as needed. 360 mL 3  . potassium chloride SA (K-DUR,KLOR-CON) 20 MEQ tablet One pill twice a day; can crush and take with apple sauce. 60 tablet 3  . traMADol (ULTRAM) 50 MG tablet Take 1 tablet (50 mg total) by mouth every 8 (eight) hours as needed. 45 tablet 0   No current facility-administered medications for this visit.     PHYSICAL EXAMINATION: ECOG PERFORMANCE STATUS: 0 - Asymptomatic  BP 132/89 (BP Location: Left Arm, Patient Position: Sitting)   Pulse 90   Temp 97.2  F (36.2 C) (Tympanic)   Resp 20   Ht '5\' 3"'$  (1.6 m)   Wt 114 lb 9.6 oz (52 kg)   SpO2 100%   BMI 20.30 kg/m   Filed Weights   02/07/16 1021  Weight: 114 lb 9.6 oz (52 kg)    GENERAL: Well-nourished well-developed; Alert, no distress and comfortable.   Alone.  EYES: no pallor or icterus OROPHARYNX: no thrush or ulceration; good dentition  NECK: supple, no masses felt LYMPH:  no palpable lymphadenopathy in the cervical, axillary or inguinal regions LUNGS: Decreased air entry to auscultation bilaterally and  No wheeze or crackles HEART/CVS: regular rate & rhythm and no murmurs; No lower extremity edema ABDOMEN:abdomen soft, non-tender and normal bowel sounds Musculoskeletal:no cyanosis of digits and no clubbing  PSYCH: alert & oriented x 3 with fluent speech NEURO: no focal motor/sensory deficits SKIN:  no rashes or significant lesions  LABORATORY DATA:  I have reviewed the data as listed    Component Value Date/Time   NA 137 02/07/2016 1000   NA 135 08/29/2014 1457   K 3.8 02/07/2016 1000   K 3.7 08/29/2014 1457   CL 111 02/07/2016 1000   CL 105 08/29/2014 1457   CO2 22 02/07/2016 1000   CO2 24 08/29/2014 1457   GLUCOSE 87 02/07/2016 1000   GLUCOSE 98 08/29/2014 1457   BUN 15 02/07/2016 1000   BUN 8 08/29/2014 1457   CREATININE 0.64 02/07/2016 1000   CREATININE 0.64 08/29/2014 1457   CALCIUM 8.8 (L) 02/07/2016 1000   CALCIUM 9.0 08/29/2014 1457   PROT 9.2 (H) 02/07/2016 1000   PROT 9.3 (H) 08/29/2014 1457   ALBUMIN 3.3 (L) 02/07/2016 1000   ALBUMIN 3.6 08/29/2014 1457   AST 49 (H) 02/07/2016 1000   AST 111 (H) 08/29/2014 1457   ALT 24 02/07/2016 1000   ALT 78 (H) 08/29/2014 1457   ALKPHOS 144 (H) 02/07/2016 1000   ALKPHOS 168 (H) 08/29/2014 1457   BILITOT 0.5 02/07/2016 1000   BILITOT 0.6 08/29/2014 1457   GFRNONAA >60 02/07/2016 1000   GFRNONAA >60 08/29/2014 1457   GFRAA >60 02/07/2016 1000   GFRAA >60 08/29/2014 1457    No results found for: SPEP,  UPEP  Lab Results  Component Value Date   WBC 7.1 02/07/2016   NEUTROABS 4.6 02/07/2016   HGB 9.3 (L) 02/07/2016   HCT 28.3 (L) 02/07/2016   MCV 70.8 (L) 02/07/2016   PLT 346 02/07/2016      Chemistry      Component Value Date/Time   NA 137 02/07/2016 1000   NA 135 08/29/2014 1457   K 3.8 02/07/2016 1000   K 3.7 08/29/2014 1457   CL 111 02/07/2016 1000   CL 105 08/29/2014 1457   CO2 22 02/07/2016 1000   CO2 24 08/29/2014 1457   BUN 15 02/07/2016 1000   BUN 8 08/29/2014 1457  CREATININE 0.64 02/07/2016 1000   CREATININE 0.64 08/29/2014 1457      Component Value Date/Time   CALCIUM 8.8 (L) 02/07/2016 1000   CALCIUM 9.0 08/29/2014 1457   ALKPHOS 144 (H) 02/07/2016 1000   ALKPHOS 168 (H) 08/29/2014 1457   AST 49 (H) 02/07/2016 1000   AST 111 (H) 08/29/2014 1457   ALT 24 02/07/2016 1000   ALT 78 (H) 08/29/2014 1457   BILITOT 0.5 02/07/2016 1000   BILITOT 0.6 08/29/2014 1457      IMPRESSION: 1. Interval growth of irregular solid 1.0 cm pulmonary nodules in the basilar right upper lobe and central right lower lobe, most suggestive of pulmonary metastases. 2. Stable masslike fibrosis in the right upper and medial right lower lobes. 3. New patchy ground-glass opacity in the left lower lobe, probably inflammatory, recommend attention on follow-up chest CT. 4. Stable mild subcarinal lymphadenopathy, nodal metastasis not excluded. 5. Additional findings include aortic atherosclerosis and 2 vessel coronary atherosclerosis.   Electronically Signed   By: Ilona Sorrel M.D.   On: 12/24/2015 13:47  RADIOGRAPHIC STUDIES: I have personally reviewed the radiological images as listed and agreed with the findings in the report. No results found.   ASSESSMENT & PLAN:  Primary cancer of bronchus of right lower lobe ([place]) # Right lung ca- with recurrence [based on imaging]- metastases to right lung ~10-43m on OPDIVO s/p #2.   # proceed with #3  today. Tolerating fairly  well. We will get a CT scan after cycle #4  # Chest wall pain- sec to scar tissue/pleuritic.  tramadol 50 q 8hr.  # Dysphagia- radiation stricture s/p dilatation- Jan 2017 [Dr.Oh]  esophagogram- smooth strciture- weight loss-sec to #1. Recommend ensure; reocmmend GI referra.   # COPD/bronchitis- reocmmend nebs/ meds. Tessalon pearls.   # microcytic anemia- Hb- 9.8- check Iron studies/ferrtin today.   # alcohol abuse- ? Elevated LFts. Monitor closely.   # follow up in 2 weeks/ opdivo/ labs.     Orders Placed This Encounter  Procedures  . CBC with Differential    Standing Status:   Future    Standing Expiration Date:   02/06/2017  . Comprehensive metabolic panel    Standing Status:   Future    Standing Expiration Date:   02/06/2017  . TSH    Standing Status:   Future    Standing Expiration Date:   02/06/2017  . Ferritin    Standing Status:   Future    Number of Occurrences:   1    Standing Expiration Date:   02/06/2017  . Iron and TIBC    Standing Status:   Future    Number of Occurrences:   1    Standing Expiration Date:   02/06/2017  . Ambulatory referral to Gastroenterology    Referral Priority:   Routine    Referral Type:   Consultation    Referral Reason:   Specialty Services Required    Referred to Provider:   DLucilla Lame MD    Requested Specialty:   Gastroenterology    Number of Visits Requested:   1[place] MD 02/07/2016 5:54 PM

## 2016-02-10 ENCOUNTER — Telehealth: Payer: Self-pay | Admitting: *Deleted

## 2016-02-10 ENCOUNTER — Other Ambulatory Visit: Payer: Self-pay | Admitting: Internal Medicine

## 2016-02-10 ENCOUNTER — Other Ambulatory Visit: Payer: Self-pay | Admitting: *Deleted

## 2016-02-10 DIAGNOSIS — R05 Cough: Secondary | ICD-10-CM

## 2016-02-10 DIAGNOSIS — D508 Other iron deficiency anemias: Secondary | ICD-10-CM | POA: Insufficient documentation

## 2016-02-10 DIAGNOSIS — Z95828 Presence of other vascular implants and grafts: Secondary | ICD-10-CM

## 2016-02-10 DIAGNOSIS — R059 Cough, unspecified: Secondary | ICD-10-CM

## 2016-02-10 MED ORDER — LIDOCAINE-PRILOCAINE 2.5-2.5 % EX CREA: 1.0000 "application " | TOPICAL_CREAM | CUTANEOUS | 6 refills | Status: DC | PRN

## 2016-02-10 MED ORDER — DIPHENHYDRAMINE HCL 25 MG PO CAPS: 25.0000 mg | ORAL_CAPSULE | Freq: Four times a day (QID) | ORAL | 0 refills | Status: DC | PRN

## 2016-02-10 MED ORDER — BENZONATATE 100 MG PO CAPS: 100.0000 mg | ORAL_CAPSULE | Freq: Three times a day (TID) | ORAL | 0 refills | Status: DC | PRN

## 2016-02-10 NOTE — Telephone Encounter (Signed)
Patient called. Did not get tessalon pearls nor emla cream nor benadryl to pharmacy.  Spoke with MD ok to send rx to pt's pharmacy for these medications.  Pt also requesting that Elease Etienne send an approval for 90 days supply for ensure to [employer]. She states that [employer]ab[employer] Lister only approved for 30 days supply the last time. I explained that I would have to contact [employer] and determine if patient is eligible for funding for ensure.

## 2016-02-10 NOTE — Telephone Encounter (Signed)
I [facility] with [facility]itch [facility] and she will see if she can find me another number for the patient. In the mean time, I will save her referral until I hear back from them.

## 2016-02-10 NOTE — Telephone Encounter (Signed)
Phone number has been disconnected. Will send notification letter to patients address.

## 2016-02-12 NOTE — Telephone Encounter (Signed)
Called patient. Voicemail has not been set up yet

## 2016-02-12 NOTE — Telephone Encounter (Signed)
Patient is calling to set up her EGD [phone]

## 2016-02-17 ENCOUNTER — Telehealth: Payer: Self-pay

## 2016-02-17 ENCOUNTER — Other Ambulatory Visit: Payer: Self-pay

## 2016-02-17 NOTE — Telephone Encounter (Signed)
Dyspagia R13.10 [facility] 04/07/2016 Medicare/medicaid Pre cert is not required

## 2016-02-17 NOTE — Telephone Encounter (Signed)
Gastroenterology Pre-Procedure Review  Request Date: 04/07/2016 Requesting Physician: Dr. Dian Situ   PATIENT REVIEW QUESTIONS: The patient responded to the following health history questions as indicated:    1. Are you having any GI issues? no 2. Do you have a personal history of Polyps? no 3. Do you have a family history of Colon Cancer or Polyps? no 4. Diabetes Mellitus? no 5. Joint replacements in the past 12 months?no 6. Major health problems in the past 3 months?no 7. Any artificial heart valves, MVP, or defibrillator?no    MEDICATIONS & ALLERGIES:    Patient reports the following regarding taking any anticoagulation/antiplatelet therapy:   Plavix, Coumadin, Eliquis, Xarelto, Lovenox, Pradaxa, Brilinta, or Effient? no Aspirin? no  Patient confirms/reports the following medications:  Current Outpatient Prescriptions  Medication Sig Dispense Refill  . benzonatate (TESSALON PERLES) 100 MG capsule Take 1 capsule (100 mg total) by mouth 3 (three) times daily as needed for cough. 20 capsule 0  . ipratropium-albuterol (DUONEB) 0.5-2.5 (3) MG/3ML SOLN Take 3 mLs by nebulization every 4 (four) hours as needed. 360 mL 3  . traMADol (ULTRAM) 50 MG tablet Take 1 tablet (50 mg total) by mouth every 8 (eight) hours as needed. 45 tablet 0   No current facility-administered medications for this visit.     Patient confirms/reports the following allergies:  No Known Allergies  No orders of the defined types were placed in this encounter.   AUTHORIZATION INFORMATION Primary Insurance: 1D#: Group #:  Secondary Insurance: 1D#: Group #:  SCHEDULE INFORMATION: Date: 04/07/2016 Time: Location: [facility]

## 2016-02-21 ENCOUNTER — Inpatient Hospital Stay: Payer: Medicare Other

## 2016-02-21 ENCOUNTER — Inpatient Hospital Stay: Payer: Medicare Other | Attending: Internal Medicine | Admitting: Internal Medicine

## 2016-02-21 VITALS — BP 125/83 | HR 97 | Temp 97.6°F | Resp 16 | Ht 63.0 in | Wt 113.0 lb

## 2016-02-21 DIAGNOSIS — C3431 Malignant neoplasm of lower lobe, right bronchus or lung: Secondary | ICD-10-CM | POA: Insufficient documentation

## 2016-02-21 DIAGNOSIS — J40 Bronchitis, not specified as acute or chronic: Secondary | ICD-10-CM | POA: Diagnosis not present

## 2016-02-21 DIAGNOSIS — Z5111 Encounter for antineoplastic chemotherapy: Secondary | ICD-10-CM | POA: Insufficient documentation

## 2016-02-21 DIAGNOSIS — J449 Chronic obstructive pulmonary disease, unspecified: Secondary | ICD-10-CM | POA: Insufficient documentation

## 2016-02-21 DIAGNOSIS — D508 Other iron deficiency anemias: Secondary | ICD-10-CM

## 2016-02-21 DIAGNOSIS — C7801 Secondary malignant neoplasm of right lung: Secondary | ICD-10-CM | POA: Insufficient documentation

## 2016-02-21 DIAGNOSIS — F17210 Nicotine dependence, cigarettes, uncomplicated: Secondary | ICD-10-CM | POA: Diagnosis not present

## 2016-02-21 DIAGNOSIS — F329 Major depressive disorder, single episode, unspecified: Secondary | ICD-10-CM | POA: Diagnosis not present

## 2016-02-21 DIAGNOSIS — B182 Chronic viral hepatitis C: Secondary | ICD-10-CM | POA: Insufficient documentation

## 2016-02-21 DIAGNOSIS — Z79899 Other long term (current) drug therapy: Secondary | ICD-10-CM | POA: Insufficient documentation

## 2016-02-21 DIAGNOSIS — I700 Atherosclerosis of aorta: Secondary | ICD-10-CM | POA: Diagnosis not present

## 2016-02-21 DIAGNOSIS — J439 Emphysema, unspecified: Secondary | ICD-10-CM | POA: Insufficient documentation

## 2016-02-21 DIAGNOSIS — F1010 Alcohol abuse, uncomplicated: Secondary | ICD-10-CM | POA: Diagnosis not present

## 2016-02-21 DIAGNOSIS — I2510 Atherosclerotic heart disease of native coronary artery without angina pectoris: Secondary | ICD-10-CM | POA: Diagnosis not present

## 2016-02-21 DIAGNOSIS — R1310 Dysphagia, unspecified: Secondary | ICD-10-CM | POA: Insufficient documentation

## 2016-02-21 LAB — CBC WITH DIFFERENTIAL/PLATELET
BASOS ABS: 0.1 10*3/uL (ref 0–0.1)
BASOS PCT: 2 %
Eosinophils Absolute: 0.1 10*3/uL (ref 0–0.7)
Eosinophils Relative: 2 %
HEMATOCRIT: 30 % — AB (ref 35.0–47.0)
HEMOGLOBIN: 10 g/dL — AB (ref 12.0–16.0)
Lymphocytes Relative: 27 %
Lymphs Abs: 1.2 10*3/uL (ref 1.0–3.6)
MCH: 23.9 pg — ABNORMAL LOW (ref 26.0–34.0)
MCHC: 33.2 g/dL (ref 32.0–36.0)
MCV: 72.1 fL — ABNORMAL LOW (ref 80.0–100.0)
MONOS PCT: 12 %
Monocytes Absolute: 0.5 10*3/uL (ref 0.2–0.9)
NEUTROS ABS: 2.6 10*3/uL (ref 1.4–6.5)
NEUTROS PCT: 57 %
Platelets: 312 10*3/uL (ref 150–440)
RBC: 4.17 MIL/uL (ref 3.80–5.20)
RDW: 21.5 % — AB (ref 11.5–14.5)
WBC: 4.6 10*3/uL (ref 3.6–11.0)

## 2016-02-21 LAB — COMPREHENSIVE METABOLIC PANEL
ALBUMIN: 3.4 g/dL — AB (ref 3.5–5.0)
ALT: 28 U/L (ref 14–54)
AST: 88 U/L — AB (ref 15–41)
Alkaline Phosphatase: 157 U/L — ABNORMAL HIGH (ref 38–126)
Anion gap: 4 — ABNORMAL LOW (ref 5–15)
BILIRUBIN TOTAL: 0.6 mg/dL (ref 0.3–1.2)
BUN: 13 mg/dL (ref 6–20)
CO2: 20 mmol/L — AB (ref 22–32)
Calcium: 9.1 mg/dL (ref 8.9–10.3)
Chloride: 109 mmol/L (ref 101–111)
Creatinine, Ser: 0.66 mg/dL (ref 0.44–1.00)
GFR calc Af Amer: >60 mL/min (ref >60)
GFR calc non Af Amer: >60 mL/min (ref >60)
GLUCOSE: 102 mg/dL — AB (ref 65–99)
POTASSIUM: 3.7 mmol/L (ref 3.5–5.1)
Sodium: 133 mmol/L — ABNORMAL LOW (ref 135–145)
TOTAL PROTEIN: 9.3 g/dL — AB (ref 6.5–8.1)

## 2016-02-21 LAB — TSH: TSH: 1.385 u[IU]/mL (ref 0.350–4.500)

## 2016-02-21 MED ORDER — SODIUM CHLORIDE 0.9 % IV SOLN
Freq: Once | INTRAVENOUS | Status: AC
  Administered 2016-02-21: 12:00:00 via INTRAVENOUS
  Filled 2016-02-21: qty 1000

## 2016-02-21 MED ORDER — SODIUM CHLORIDE 0.9 % IV SOLN
240.0000 mg | Freq: Once | INTRAVENOUS | Status: AC
  Administered 2016-02-21: 240 mg via INTRAVENOUS
  Filled 2016-02-21: qty 20

## 2016-02-21 MED ORDER — SODIUM CHLORIDE 0.9% FLUSH
10.0000 mL | INTRAVENOUS | Status: DC | PRN
  Administered 2016-02-21: 10 mL
  Filled 2016-02-21: qty 10

## 2016-02-21 MED ORDER — SODIUM CHLORIDE 0.9 % IV SOLN
Freq: Once | INTRAVENOUS | Status: DC
  Filled 2016-02-21: qty 1000

## 2016-02-21 MED ORDER — HEPARIN SOD (PORK) LOCK FLUSH 100 UNIT/ML IV SOLN
500.0000 [IU] | Freq: Once | INTRAVENOUS | Status: AC | PRN
  Administered 2016-02-21: 500 [IU]
  Filled 2016-02-21: qty 5

## 2016-02-21 MED ORDER — SODIUM CHLORIDE 0.9 % IV SOLN
200.0000 mg | Freq: Once | INTRAVENOUS | Status: AC
  Administered 2016-02-21: 200 mg via INTRAVENOUS
  Filled 2016-02-21: qty 10

## 2016-02-21 NOTE — Progress Notes (Signed)
[place] OFFICE PROGRESS NOTE  Patient Care Team: [place] as PCP - General (General Practice)  No matching staging information was found for the patient.   Oncology History   Chief Complaint/Diagnosis:   1. 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 Faith Rogue, cardiothoracic surgeon) 2. Started on radiation and concurrent chemotherapy with carboplatinum Taxol from April of 2013 3. Finished radiation and chemotherapy with carboplatinum and Taxol in May of 2013 4. Stage IIIB (T4N3M0) NSCLC who completed a course of EBRT on Oct 07, 2011 with a total dose of 7000cGy.   5. Starting consolidation with 2 cycles of carboplatinum and Abraxane 6.patient is off chemotherapy since November of 2013.  Repeat CT scan shows stable disease(January, 2014) 7  Started been on Taxotere and  RAMICIRUMAB from June 07, 2013 8.chemotherapy with Taxotere  and  RAMUCIRUMAB discontinued in March of 2015 because of hemoptysis 9.Started [name] April of 2015. 10NIVOLULAMABWas put on hold because of abnormal liver enzymes January of 2016  # AUG 2017- Right lung recurrence [2 nodules - ~1cm; stable lung mass/fibrosis]. START OPDIVO q 2W [small to Bx; s/p rad-onc eval]  # MOLECULAR TESTING- ? Not enough tissue.      Carcinoma of lung ([place])   10/12/2014 Initial Diagnosis    Carcinoma of lung       Primary cancer of bronchus of right lower lobe ([place])   12/27/2015 Initial Diagnosis    Primary cancer of bronchus of right lower lobe ([facility])        INTERVAL HISTORY:  Jeanette Jones 60 y.o.  female pleasant patient above history of stage IV lung cancer squamous cell/ recurrent- Patient currently on Opdivo status post cycle #3.   Continues to complain of cough. Not any worse. No hemoptysis. Chronic shortness of breath. Denies getting any worse. No headaches. Complains  of right chest wall pain with coughing-helped with tramadol. Chronic difficult to swallowing not improved. Awaiting gastroenterology valuation. Lost some weight.    REVIEW OF SYSTEMS:  A complete 10 point review of system is done which is negative except mentioned above/history of present illness.   PAST MEDICAL HISTORY :  Past Medical History:  Diagnosis Date  . Back pain, chronic   . Carcinoma of lung ([place]) 10/12/2014  . Cough   . Depression   . Dyspnea   . Emphysema of lung ([place])   . Heart murmur   . Hep C w/o coma, chronic ([place])   . Lung cancer ([place])   . Swallowing difficulty     PAST SURGICAL HISTORY :   Past Surgical History:  Procedure Laterality Date  . ESOPHAGOGASTRODUODENOSCOPY N/A 06/08/2015   Procedure: ESOPHAGOGASTRODUODENOSCOPY (EGD);  Surgeon: Hulen Luster, MD;  Location: [facility] ENDOSCOPY;  Service: Gastroenterology;  Laterality: N/A;  . HEMORROIDECTOMY    . LUNG BIOPSY    . TUBAL LIGATION      FAMILY HISTORY :   Family History  Problem Relation Age of Onset  . Breast cancer Neg Hx     SOCIAL HISTORY:   Social History  Substance Use Topics  . Smoking status: Current Every Day Smoker    Packs/day: 0.25    Types: Cigarettes  . Smokeless tobacco: Never Used  . Alcohol use Not on file    ALLERGIES:  has No Known Allergies.  MEDICATIONS:  Current Outpatient Prescriptions  Medication Sig Dispense Refill  . ipratropium-albuterol (  DUONEB) 0.5-2.5 (3) MG/3ML SOLN Take 3 mLs by nebulization every 4 (four) hours as needed. 360 mL 3  . lidocaine-prilocaine (EMLA) cream Apply 1 application topically as needed. To port a cath site- 1 hr prior to port access    . traMADol (ULTRAM) 50 MG tablet Take 1 tablet (50 mg total) by mouth every 8 (eight) hours as needed. 45 tablet 0  . benzonatate (TESSALON PERLES) 100 MG capsule Take 1 capsule (100 mg total) by mouth 3 (three) times daily as needed for cough. (Patient not taking: Reported on 02/21/2016) 20 capsule 0   No  current facility-administered medications for this visit.    Facility-Administered Medications Ordered in Other Visits  Medication Dose Route Frequency Provider Last Rate Last Dose  . 0.9 %  sodium chloride infusion   Intravenous Once Cammie Sickle, MD      . sodium chloride flush (NS) 0.9 % injection 10 mL  10 mL Intracatheter PRN Cammie Sickle, MD   10 mL at 02/21/16 1100    PHYSICAL EXAMINATION: ECOG PERFORMANCE STATUS: 0 - Asymptomatic  BP 125/83 (BP Location: Left Arm, Patient Position: Sitting)   Pulse 97   Temp 97.6 F (36.4 C) (Tympanic)   Resp 16   Ht '5\' 3"'$  (1.6 m)   Wt 113 lb (51.3 kg)   BMI 20.02 kg/m   Filed Weights   02/21/16 1046  Weight: 113 lb (51.3 kg)    GENERAL: Well-nourished well-developed; Alert, no distress and comfortable.   Alone.  EYES: no pallor or icterus OROPHARYNX: no thrush or ulceration; good dentition  NECK: supple, no masses felt LYMPH:  no palpable lymphadenopathy in the cervical, axillary or inguinal regions LUNGS: Decreased air entry to auscultation bilaterally and  No wheeze or crackles HEART/CVS: regular rate & rhythm and no murmurs; No lower extremity edema ABDOMEN:abdomen soft, non-tender and normal bowel sounds Musculoskeletal:no cyanosis of digits and no clubbing  PSYCH: alert & oriented x 3 with fluent speech NEURO: no focal motor/sensory deficits SKIN:  no rashes or significant lesions  LABORATORY DATA:  I have reviewed the data as listed    Component Value Date/Time   NA 133 (L) 02/21/2016 1010   NA 135 08/29/2014 1457   K 3.7 02/21/2016 1010   K 3.7 08/29/2014 1457   CL 109 02/21/2016 1010   CL 105 08/29/2014 1457   CO2 20 (L) 02/21/2016 1010   CO2 24 08/29/2014 1457   GLUCOSE 102 (H) 02/21/2016 1010   GLUCOSE 98 08/29/2014 1457   BUN 13 02/21/2016 1010   BUN 8 08/29/2014 1457   CREATININE 0.66 02/21/2016 1010   CREATININE 0.64 08/29/2014 1457   CALCIUM 9.1 02/21/2016 1010   CALCIUM 9.0 08/29/2014  1457   PROT 9.3 (H) 02/21/2016 1010   PROT 9.3 (H) 08/29/2014 1457   ALBUMIN 3.4 (L) 02/21/2016 1010   ALBUMIN 3.6 08/29/2014 1457   AST 88 (H) 02/21/2016 1010   AST 111 (H) 08/29/2014 1457   ALT 28 02/21/2016 1010   ALT 78 (H) 08/29/2014 1457   ALKPHOS 157 (H) 02/21/2016 1010   ALKPHOS 168 (H) 08/29/2014 1457   BILITOT 0.6 02/21/2016 1010   BILITOT 0.6 08/29/2014 1457   GFRNONAA >60 02/21/2016 1010   GFRNONAA >60 08/29/2014 1457   GFRAA >60 02/21/2016 1010   GFRAA >60 08/29/2014 1457    No results found for: SPEP, UPEP  Lab Results  Component Value Date   WBC 4.6 02/21/2016   NEUTROABS 2.6 02/21/2016  HGB 10.0 (L) 02/21/2016   HCT 30.0 (L) 02/21/2016   MCV 72.1 (L) 02/21/2016   PLT 312 02/21/2016      Chemistry      Component Value Date/Time   NA 133 (L) 02/21/2016 1010   NA 135 08/29/2014 1457   K 3.7 02/21/2016 1010   K 3.7 08/29/2014 1457   CL 109 02/21/2016 1010   CL 105 08/29/2014 1457   CO2 20 (L) 02/21/2016 1010   CO2 24 08/29/2014 1457   BUN 13 02/21/2016 1010   BUN 8 08/29/2014 1457   CREATININE 0.66 02/21/2016 1010   CREATININE 0.64 08/29/2014 1457      Component Value Date/Time   CALCIUM 9.1 02/21/2016 1010   CALCIUM 9.0 08/29/2014 1457   ALKPHOS 157 (H) 02/21/2016 1010   ALKPHOS 168 (H) 08/29/2014 1457   AST 88 (H) 02/21/2016 1010   AST 111 (H) 08/29/2014 1457   ALT 28 02/21/2016 1010   ALT 78 (H) 08/29/2014 1457   BILITOT 0.6 02/21/2016 1010   BILITOT 0.6 08/29/2014 1457      IMPRESSION: 1. Interval growth of irregular solid 1.0 cm pulmonary nodules in the basilar right upper lobe and central right lower lobe, most suggestive of pulmonary metastases. 2. Stable masslike fibrosis in the right upper and medial right lower lobes. 3. New patchy ground-glass opacity in the left lower lobe, probably inflammatory, recommend attention on follow-up chest CT. 4. Stable mild subcarinal lymphadenopathy, nodal metastasis not excluded. 5.  Additional findings include aortic atherosclerosis and 2 vessel coronary atherosclerosis.   Electronically Signed   By: Ilona Sorrel M.D.   On: 12/24/2015 13:47  RADIOGRAPHIC STUDIES: I have personally reviewed the radiological images as listed and agreed with the findings in the report. No results found.   ASSESSMENT & PLAN:  Primary cancer of bronchus of right lower lobe ([place]) # Right lung ca- with recurrence [based on imaging]- metastases to right lung ~10-8m on OPDIVO s/p #3  # proceed with #4   today. Tolerating fairly well. We will get a CT scan after this cycle.   # Chest wall pain- sec to scar tissue/pleuritic.  tramadol 50 q 8hr.  # Dysphagia- radiation stricture s/p dilatation- Jan 2017 [Dr.Oh]  esophagogram- smooth strciture- weight loss-sec to #1. Recommend ensure; awaiting GI eval in few weeks.   # COPD/bronchitis- reocmmend nebs/ meds. Tessalon pearls. claritin once a day.   # IDA- ? Etiology; awaiting GI eval- proceed with IV iron weekly x4; starting today.   # alcohol abuse- ? slight Elevated LFts. Monitor closely.   # follow up in 2 weeks/ opdivo/ labs. CT scan prior.     Orders Placed This Encounter  Procedures  . CT CHEST W CONTRAST    Standing Status:   Future    Standing Expiration Date:   04/22/2017    Order Specific Question:   Reason for Exam (SYMPTOM  OR DIAGNOSIS REQUIRED)    Answer:   lung cancer    Order Specific Question:   Is the patient pregnant?    Answer:   No    Order Specific Question:   [facility]    [facility]     GCammie Sickle MD 02/21/2016 5:52 PM

## 2016-02-21 NOTE — Progress Notes (Signed)
Patient declines influenza vaccine due to head cold today and productive cough- white cream sputum. C/o mild dypsnea with exertion.  Patient denies any nausea, vomiting, diarrhea, mouth sores.

## 2016-02-21 NOTE — Assessment & Plan Note (Signed)
#   Right lung ca- with recurrence [based on imaging]- metastases to right lung ~10-92m on OPDIVO s/p #3  # proceed with #4   today. Tolerating fairly well. We will get a CT scan after this cycle.   # Chest wall pain- sec to scar tissue/pleuritic.  tramadol 50 q 8hr.  # Dysphagia- radiation stricture s/p dilatation- Jan 2017 [Dr.Oh]  esophagogram- smooth strciture- weight loss-sec to #1. Recommend ensure; awaiting GI eval in few weeks.   # COPD/bronchitis- reocmmend nebs/ meds. Tessalon pearls. claritin once a day.   # IDA- ? Etiology; awaiting GI eval- proceed with IV iron weekly x4; starting today.   # alcohol abuse- ? slight Elevated LFts. Monitor closely.   # follow up in 2 weeks/ opdivo/ labs. CT scan prior.

## 2016-02-28 ENCOUNTER — Inpatient Hospital Stay: Payer: Medicare Other

## 2016-03-02 ENCOUNTER — Ambulatory Visit
Admission: RE | Admit: 2016-03-02 | Discharge: 2016-03-02 | Disposition: A | Discharge status: Home / Self Care | Payer: Medicare Other | Source: Ambulatory Visit | Attending: Internal Medicine | Admitting: Internal Medicine

## 2016-03-02 DIAGNOSIS — C3431 Malignant neoplasm of lower lobe, right bronchus or lung: Secondary | ICD-10-CM | POA: Diagnosis not present

## 2016-03-02 DIAGNOSIS — R918 Other nonspecific abnormal finding of lung field: Secondary | ICD-10-CM | POA: Diagnosis not present

## 2016-03-02 DIAGNOSIS — R590 Localized enlarged lymph nodes: Secondary | ICD-10-CM | POA: Insufficient documentation

## 2016-03-02 MED ORDER — IOPAMIDOL (ISOVUE-300) INJECTION 61%
75.0000 mL | Freq: Once | INTRAVENOUS | Status: AC | PRN
  Administered 2016-03-02: 75 mL via INTRAVENOUS

## 2016-03-06 ENCOUNTER — Inpatient Hospital Stay (HOSPITAL_BASED_OUTPATIENT_CLINIC_OR_DEPARTMENT_OTHER): Payer: Medicare Other | Admitting: Internal Medicine

## 2016-03-06 ENCOUNTER — Inpatient Hospital Stay: Payer: Medicare Other

## 2016-03-06 VITALS — BP 116/79 | HR 98 | Temp 96.3°F | Resp 18 | Wt 114.0 lb

## 2016-03-06 DIAGNOSIS — Z5111 Encounter for antineoplastic chemotherapy: Secondary | ICD-10-CM | POA: Diagnosis not present

## 2016-03-06 DIAGNOSIS — J449 Chronic obstructive pulmonary disease, unspecified: Secondary | ICD-10-CM

## 2016-03-06 DIAGNOSIS — D508 Other iron deficiency anemias: Secondary | ICD-10-CM

## 2016-03-06 DIAGNOSIS — R1310 Dysphagia, unspecified: Secondary | ICD-10-CM | POA: Diagnosis not present

## 2016-03-06 DIAGNOSIS — C7801 Secondary malignant neoplasm of right lung: Secondary | ICD-10-CM

## 2016-03-06 DIAGNOSIS — F17210 Nicotine dependence, cigarettes, uncomplicated: Secondary | ICD-10-CM

## 2016-03-06 DIAGNOSIS — C3431 Malignant neoplasm of lower lobe, right bronchus or lung: Secondary | ICD-10-CM | POA: Diagnosis not present

## 2016-03-06 DIAGNOSIS — J40 Bronchitis, not specified as acute or chronic: Secondary | ICD-10-CM

## 2016-03-06 DIAGNOSIS — Z79899 Other long term (current) drug therapy: Secondary | ICD-10-CM

## 2016-03-06 DIAGNOSIS — F1010 Alcohol abuse, uncomplicated: Secondary | ICD-10-CM

## 2016-03-06 LAB — CBC WITH DIFFERENTIAL/PLATELET
BASOS PCT: 2 %
Basophils Absolute: 0.1 10*3/uL (ref 0–0.1)
EOS PCT: 2 %
Eosinophils Absolute: 0.1 10*3/uL (ref 0–0.7)
HEMATOCRIT: 32.1 % — AB (ref 35.0–47.0)
Hemoglobin: 10.6 g/dL — ABNORMAL LOW (ref 12.0–16.0)
Lymphocytes Relative: 38 %
Lymphs Abs: 1.5 10*3/uL (ref 1.0–3.6)
MCH: 25.5 pg — ABNORMAL LOW (ref 26.0–34.0)
MCHC: 33.1 g/dL (ref 32.0–36.0)
MCV: 77.1 fL — AB (ref 80.0–100.0)
MONO ABS: 0.5 10*3/uL (ref 0.2–0.9)
MONOS PCT: 13 %
NEUTROS ABS: 1.7 10*3/uL (ref 1.4–6.5)
Neutrophils Relative %: 45 %
PLATELETS: 236 10*3/uL (ref 150–440)
RBC: 4.16 MIL/uL (ref 3.80–5.20)
RDW: 26.5 % — AB (ref 11.5–14.5)
WBC: 3.9 10*3/uL (ref 3.6–11.0)

## 2016-03-06 LAB — COMPREHENSIVE METABOLIC PANEL
ALBUMIN: 3.6 g/dL (ref 3.5–5.0)
ALT: 36 U/L (ref 14–54)
ANION GAP: 8 (ref 5–15)
AST: 74 U/L — ABNORMAL HIGH (ref 15–41)
Alkaline Phosphatase: 143 U/L — ABNORMAL HIGH (ref 38–126)
BILIRUBIN TOTAL: 0.5 mg/dL (ref 0.3–1.2)
BUN: 7 mg/dL (ref 6–20)
CO2: 20 mmol/L — ABNORMAL LOW (ref 22–32)
Calcium: 8.9 mg/dL (ref 8.9–10.3)
Chloride: 108 mmol/L (ref 101–111)
Creatinine, Ser: 0.64 mg/dL (ref 0.44–1.00)
GFR calc Af Amer: >60 mL/min (ref >60)
Glucose, Bld: 95 mg/dL (ref 65–99)
POTASSIUM: 3.6 mmol/L (ref 3.5–5.1)
Sodium: 136 mmol/L (ref 135–145)
TOTAL PROTEIN: 9.2 g/dL — AB (ref 6.5–8.1)

## 2016-03-06 MED ORDER — HEPARIN SOD (PORK) LOCK FLUSH 100 UNIT/ML IV SOLN
500.0000 [IU] | Freq: Once | INTRAVENOUS | Status: AC | PRN
  Administered 2016-03-06: 500 [IU]
  Filled 2016-03-06: qty 5

## 2016-03-06 MED ORDER — SODIUM CHLORIDE 0.9 % IV SOLN: 200.0000 mg | Freq: Once | INTRAVENOUS | Status: DC

## 2016-03-06 MED ORDER — SODIUM CHLORIDE 0.9% FLUSH
10.0000 mL | INTRAVENOUS | Status: DC | PRN
  Filled 2016-03-06: qty 10

## 2016-03-06 MED ORDER — SODIUM CHLORIDE 0.9 % IV SOLN
Freq: Once | INTRAVENOUS | Status: AC
  Administered 2016-03-06: 10:00:00 via INTRAVENOUS
  Filled 2016-03-06: qty 1000

## 2016-03-06 MED ORDER — IRON SUCROSE 20 MG/ML IV SOLN
200.0000 mg | Freq: Once | INTRAVENOUS | Status: AC
  Administered 2016-03-06: 200 mg via INTRAVENOUS
  Filled 2016-03-06 (×2): qty 10

## 2016-03-06 MED ORDER — TRAMADOL HCL 50 MG PO TABS: 100.0000 mg | ORAL_TABLET | Freq: Three times a day (TID) | ORAL | 0 refills | Status: DC | PRN

## 2016-03-06 MED ORDER — NIVOLUMAB CHEMO INJECTION 100 MG/10ML
240.0000 mg | Freq: Once | INTRAVENOUS | Status: AC
  Administered 2016-03-06: 240 mg via INTRAVENOUS
  Filled 2016-03-06: qty 4

## 2016-03-06 NOTE — Assessment & Plan Note (Addendum)
#   Right lung ca- with recurrence [based on imaging]- metastases to right lung ~10-7m on OPDIVO s/p #4 cycles- CT OCT - increasing size of 2 lung nodules up to 148m  # proceed with cycle # 5 today; will monitor with CT in 8 weeks, again  # Chest wall pain- sec to scar tissue/pleuritic- worse- increase tramadol to 100 q 8hr.  # Dysphagia- radiation stricture s/p dilatation- Jan 2017 [Dr.Oh]  esophagogram- smooth strciture- weight loss-sec to #1. Recommend ensure; recommend making repeat appt with GI eval.   # COPD/bronchitis- reocmmend nebs/ meds. Tessalon pearls. Benadryl for allergies.   # IDA- ? Etiology; awaiting GI eval- proceed with IV iron weekly x4; starting today.   # alcohol abuse- ? slight Elevated LFts. Monitor closely.   # follow up in 2 weeks/ opdivo/ labs.

## 2016-03-06 NOTE — Progress Notes (Signed)
[place] OFFICE PROGRESS NOTE  Patient Care Team: [place] as PCP - General (General Practice)  No matching staging information was found for the patient.   Oncology History   Chief Complaint/Diagnosis:   1. 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 Faith Rogue, cardiothoracic surgeon) 2. Started on radiation and concurrent chemotherapy with carboplatinum Taxol from April of 2013 3. Finished radiation and chemotherapy with carboplatinum and Taxol in May of 2013 4. Stage IIIB (T4N3M0) NSCLC who completed a course of EBRT on Oct 07, 2011 with a total dose of 7000cGy.   5. Starting consolidation with 2 cycles of carboplatinum and Abraxane 6.patient is off chemotherapy since November of 2013.  Repeat CT scan shows stable disease(January, 2014) 7  Started been on Taxotere and  RAMICIRUMAB from June 07, 2013 8.chemotherapy with Taxotere  and  RAMUCIRUMAB discontinued in March of 2015 because of hemoptysis 9.Started [facility] April of 2015. 10NIVOLULAMABWas put on hold because of abnormal liver enzymes January of 2016  # AUG 2017- Right lung recurrence [2 nodules - ~1cm; stable lung mass/fibrosis]. START OPDIVO q 2W [small to Bx; s/p rad-onc eval]; opdivo x4- CT OCT 16th- PROGRESSION 2 lung nodules ~62m;   # MOLECULAR TESTING- ? Not enough tissue.      [place]arcinoma of lung ([place]   10/12/2014 Initial Diagnosis    Carcinoma of lung       Primary cancer of bronchus of right lower lobe ([place]     INTERVAL HISTORY:  Jeanette [place]89y.o.  female pleasant patient above history of stage IV lung cancer squamous cell/ recurrent- Patient currently on Opdivo status post cycle #4; also to review the results of CT scan.  Patient missed her GI doctor appointment because of death in the family.  Chronic shortness of breath. Denies getting any worse. Chronic  cough. No headaches. Complains of right chest wall pain- states her tramadol 50 mg every 8 hours is not working. Chronic difficult to swallowing not improved.  REVIEW OF SYSTEMS:  A complete 10 point review of system is done which is negative except mentioned above/history of present illness.   PAST MEDICAL HISTORY :  Past Medical History:  Diagnosis Date  . Back pain, chronic   . Carcinoma of lung ([place] 10/12/2014  . Cough   . Depression   . Dyspnea   . Emphysema of lung ([place]   . Heart murmur   . Hep C w/o coma, chronic ([place]   . Lung cancer ([place]   . Swallowing difficulty     PAST SURGICAL HISTORY :   Past Surgical History:  Procedure Laterality Date  . ESOPHAGOGASTRODUODENOSCOPY N/A 06/08/2015   Procedure: ESOPHAGOGASTRODUODENOSCOPY (EGD);  Surgeon: PHulen Luster MD;  [facility];  Service: Gastroenterology;  Laterality: N/A;  . HEMORROIDECTOMY    . LUNG BIOPSY    . TUBAL LIGATION      FAMILY HISTORY :   Family History  Problem Relation Age of Onset  . Breast cancer Neg Hx     SOCIAL HISTORY:   Social History  Substance Use Topics  . Smoking status: Current Every Day Smoker    Packs/day: 0.25    Types: Cigarettes  . Smokeless tobacco: Never Used  . Alcohol use Not on file    ALLERGIES:  has No Known Allergies.  MEDICATIONS:  Current Outpatient Prescriptions  Medication Sig Dispense Refill  .  benzonatate (TESSALON PERLES) 100 MG capsule Take 1 capsule (100 mg total) by mouth 3 (three) times daily as needed for cough. 20 capsule 0  . ipratropium-albuterol (DUONEB) 0.5-2.5 (3) MG/3ML SOLN Take 3 mLs by nebulization every 4 (four) hours as needed. 360 mL 3  . lidocaine-prilocaine (EMLA) cream Apply 1 application topically as needed. To port a cath site- 1 hr prior to port access    . traMADol (ULTRAM) 50 MG tablet Take 2 tablets (100 mg total) by mouth every 8 (eight) hours as needed. 90 tablet 0   No current facility-administered medications for this  visit.    Facility-Administered Medications Ordered in Other Visits  Medication Dose Route Frequency Provider Last Rate Last Dose  . sodium chloride flush (NS) 0.9 % injection 10 mL  10 mL Intracatheter PRN Cammie Sickle, MD        PHYSICAL EXAMINATION: ECOG PERFORMANCE STATUS: 0 - Asymptomatic  BP 116/79 (BP Location: Left Arm, Patient Position: Sitting)   Pulse 98   Temp (!) 96.3 F (35.7 C) (Tympanic)   Resp 18   Wt 114 lb (51.7 kg)   BMI 20.19 kg/m   Filed Weights   03/06/16 0922  Weight: 114 lb (51.7 kg)    GENERAL: Well-nourished well-developed; Alert, no distress and comfortable.   Alone.  EYES: no pallor or icterus OROPHARYNX: no thrush or ulceration; good dentition  NECK: supple, no masses felt LYMPH:  no palpable lymphadenopathy in the cervical, axillary or inguinal regions LUNGS: Decreased air entry to auscultation bilaterally and  No wheeze or crackles HEART/CVS: regular rate & rhythm and no murmurs; No lower extremity edema ABDOMEN:abdomen soft, non-tender and normal bowel sounds Musculoskeletal:no cyanosis of digits and no clubbing  PSYCH: alert & oriented x 3 with fluent speech NEURO: no focal motor/sensory deficits SKIN:  no rashes or significant lesions  LABORATORY DATA:  I have reviewed the data as listed    Component Value Date/Time   NA 136 03/06/2016 0850   NA 135 08/29/2014 1457   K 3.6 03/06/2016 0850   K 3.7 08/29/2014 1457   CL 108 03/06/2016 0850   CL 105 08/29/2014 1457   CO2 20 (L) 03/06/2016 0850   CO2 24 08/29/2014 1457   GLUCOSE 95 03/06/2016 0850   GLUCOSE 98 08/29/2014 1457   BUN 7 03/06/2016 0850   BUN 8 08/29/2014 1457   CREATININE 0.64 03/06/2016 0850   CREATININE 0.64 08/29/2014 1457   CALCIUM 8.9 03/06/2016 0850   CALCIUM 9.0 08/29/2014 1457   PROT 9.2 (H) 03/06/2016 0850   PROT 9.3 (H) 08/29/2014 1457   ALBUMIN 3.6 03/06/2016 0850   ALBUMIN 3.6 08/29/2014 1457   AST 74 (H) 03/06/2016 0850   AST 111 (H)  08/29/2014 1457   ALT 36 03/06/2016 0850   ALT 78 (H) 08/29/2014 1457   ALKPHOS 143 (H) 03/06/2016 0850   ALKPHOS 168 (H) 08/29/2014 1457   BILITOT 0.5 03/06/2016 0850   BILITOT 0.6 08/29/2014 1457   GFRNONAA >60 03/06/2016 0850   GFRNONAA >60 08/29/2014 1457   GFRAA >60 03/06/2016 0850   GFRAA >60 08/29/2014 1457    No results found for: SPEP, UPEP  Lab Results  Component Value Date   WBC 3.9 03/06/2016   NEUTROABS 1.7 03/06/2016   HGB 10.6 (L) 03/06/2016   HCT 32.1 (L) 03/06/2016   MCV 77.1 (L) 03/06/2016   PLT 236 03/06/2016      Chemistry      Component Value Date/Time  NA 136 03/06/2016 0850   NA 135 08/29/2014 1457   K 3.6 03/06/2016 0850   K 3.7 08/29/2014 1457   CL 108 03/06/2016 0850   CL 105 08/29/2014 1457   CO2 20 (L) 03/06/2016 0850   CO2 24 08/29/2014 1457   BUN 7 03/06/2016 0850   BUN 8 08/29/2014 1457   CREATININE 0.64 03/06/2016 0850   CREATININE 0.64 08/29/2014 1457      Component Value Date/Time   CALCIUM 8.9 03/06/2016 0850   CALCIUM 9.0 08/29/2014 1457   ALKPHOS 143 (H) 03/06/2016 0850   ALKPHOS 168 (H) 08/29/2014 1457   AST 74 (H) 03/06/2016 0850   AST 111 (H) 08/29/2014 1457   ALT 36 03/06/2016 0850   ALT 78 (H) 08/29/2014 1457   BILITOT 0.5 03/06/2016 0850   BILITOT 0.6 08/29/2014 1457      IMPRESSION: Further enlargement of 2 spiculated pulmonary nodules in the right upper and lower lobes, consistent with progressive pulmonary metastases are metachronous primary bronchogenic carcinomas.  Stable masslike areas of fibrosis in the right lung apex and right lower lobe.  Stable mild subcarinal mediastinal lymphadenopathy.  Resolution of left lower lobe ground-glass pulmonary opacity, consistent with resolving infectious or inflammatory process.  Electronically Signed   By: Earle Gell M.D.   On: 03/02/2016 14:43  RADIOGRAPHIC STUDIES: I have personally reviewed the radiological images as listed and agreed with the  findings in the report. No results found.   ASSESSMENT & PLAN:  Primary cancer of bronchus of right lower lobe ([place]) # Right lung ca- with recurrence [based on imaging]- metastases to right lung ~10-22m on OPDIVO s/p #4 cycles- CT OCT - increasing size of 2 lung nodules up to 152m  # proceed with cycle # 5 today; will monitor with CT in 8 weeks, again  # Chest wall pain- sec to scar tissue/pleuritic- worse- increase tramadol to 100 q 8hr.  # Dysphagia- radiation stricture s/p dilatation- Jan 2017 [Dr.Oh]  esophagogram- smooth strciture- weight loss-sec to #1. Recommend ensure; recommend making repeat appt with GI eval.   # COPD/bronchitis- reocmmend nebs/ meds. Tessalon pearls. Benadryl for allergies.   # IDA- ? Etiology; awaiting GI eval- proceed with IV iron weekly x4; starting today.   # alcohol abuse- ? slight Elevated LFts. Monitor closely.   # follow up in 2 weeks/ opdivo/ labs.     No orders of the defined types were placed in this encounter.     GoCammie SickleMD 03/06/2016 1:34 PM

## 2016-03-13 ENCOUNTER — Inpatient Hospital Stay: Payer: Medicare Other

## 2016-03-13 VITALS — BP 132/80 | HR 85 | Temp 96.7°F | Resp 18

## 2016-03-13 DIAGNOSIS — C3431 Malignant neoplasm of lower lobe, right bronchus or lung: Secondary | ICD-10-CM

## 2016-03-13 DIAGNOSIS — Z5111 Encounter for antineoplastic chemotherapy: Secondary | ICD-10-CM | POA: Diagnosis not present

## 2016-03-13 DIAGNOSIS — D508 Other iron deficiency anemias: Secondary | ICD-10-CM

## 2016-03-13 MED ORDER — HEPARIN SOD (PORK) LOCK FLUSH 100 UNIT/ML IV SOLN
500.0000 [IU] | Freq: Once | INTRAVENOUS | Status: AC | PRN
  Administered 2016-03-13: 500 [IU]
  Filled 2016-03-13: qty 5

## 2016-03-13 MED ORDER — IRON SUCROSE 20 MG/ML IV SOLN
200.0000 mg | Freq: Once | INTRAVENOUS | Status: AC
  Administered 2016-03-13: 200 mg via INTRAVENOUS
  Filled 2016-03-13 (×2): qty 10

## 2016-03-13 MED ORDER — SODIUM CHLORIDE 0.9 % IV SOLN: 200.0000 mg | Freq: Once | INTRAVENOUS | Status: DC

## 2016-03-13 MED ORDER — SODIUM CHLORIDE 0.9 % IV SOLN
Freq: Once | INTRAVENOUS | Status: AC
  Administered 2016-03-13: 14:00:00 via INTRAVENOUS
  Filled 2016-03-13: qty 1000

## 2016-03-13 MED ORDER — SODIUM CHLORIDE 0.9 % IJ SOLN
10.0000 mL | INTRAMUSCULAR | Status: DC | PRN
  Filled 2016-03-13: qty 10

## 2016-03-20 ENCOUNTER — Inpatient Hospital Stay: Payer: Medicare Other

## 2016-03-20 ENCOUNTER — Inpatient Hospital Stay: Payer: Medicare Other | Attending: Internal Medicine | Admitting: Internal Medicine

## 2016-03-20 DIAGNOSIS — C3431 Malignant neoplasm of lower lobe, right bronchus or lung: Secondary | ICD-10-CM

## 2016-03-20 DIAGNOSIS — Z5111 Encounter for antineoplastic chemotherapy: Secondary | ICD-10-CM | POA: Diagnosis present

## 2016-03-20 DIAGNOSIS — J439 Emphysema, unspecified: Secondary | ICD-10-CM | POA: Insufficient documentation

## 2016-03-20 DIAGNOSIS — B182 Chronic viral hepatitis C: Secondary | ICD-10-CM | POA: Diagnosis not present

## 2016-03-20 DIAGNOSIS — R040 Epistaxis: Secondary | ICD-10-CM | POA: Diagnosis not present

## 2016-03-20 DIAGNOSIS — J40 Bronchitis, not specified as acute or chronic: Secondary | ICD-10-CM

## 2016-03-20 DIAGNOSIS — F17210 Nicotine dependence, cigarettes, uncomplicated: Secondary | ICD-10-CM | POA: Diagnosis not present

## 2016-03-20 DIAGNOSIS — R1310 Dysphagia, unspecified: Secondary | ICD-10-CM | POA: Insufficient documentation

## 2016-03-20 DIAGNOSIS — C7801 Secondary malignant neoplasm of right lung: Secondary | ICD-10-CM

## 2016-03-20 DIAGNOSIS — Z79899 Other long term (current) drug therapy: Secondary | ICD-10-CM | POA: Insufficient documentation

## 2016-03-20 DIAGNOSIS — F329 Major depressive disorder, single episode, unspecified: Secondary | ICD-10-CM | POA: Diagnosis not present

## 2016-03-20 LAB — CBC WITH DIFFERENTIAL/PLATELET
BASOS ABS: 0.1 10*3/uL (ref 0–0.1)
BASOS PCT: 1 %
EOS ABS: 0.1 10*3/uL (ref 0–0.7)
EOS PCT: 2 %
HCT: 34.8 % — ABNORMAL LOW (ref 35.0–47.0)
Hemoglobin: 11.8 g/dL — ABNORMAL LOW (ref 12.0–16.0)
Lymphocytes Relative: 24 %
Lymphs Abs: 1.1 10*3/uL (ref 1.0–3.6)
MCH: 27.7 pg (ref 26.0–34.0)
MCHC: 33.8 g/dL (ref 32.0–36.0)
MCV: 81.7 fL (ref 80.0–100.0)
MONO ABS: 0.6 10*3/uL (ref 0.2–0.9)
Monocytes Relative: 14 %
Neutro Abs: 2.7 10*3/uL (ref 1.4–6.5)
Neutrophils Relative %: 59 %
PLATELETS: 234 10*3/uL (ref 150–440)
RBC: 4.26 MIL/uL (ref 3.80–5.20)
RDW: 30.2 % — AB (ref 11.5–14.5)
WBC: 4.6 10*3/uL (ref 3.6–11.0)

## 2016-03-20 LAB — COMPREHENSIVE METABOLIC PANEL
ALBUMIN: 3.7 g/dL (ref 3.5–5.0)
ALT: 24 U/L (ref 14–54)
AST: 47 U/L — AB (ref 15–41)
Alkaline Phosphatase: 132 U/L — ABNORMAL HIGH (ref 38–126)
Anion gap: 5 (ref 5–15)
BILIRUBIN TOTAL: 0.3 mg/dL (ref 0.3–1.2)
BUN: 12 mg/dL (ref 6–20)
CO2: 22 mmol/L (ref 22–32)
Calcium: 9.1 mg/dL (ref 8.9–10.3)
Chloride: 111 mmol/L (ref 101–111)
Creatinine, Ser: 0.58 mg/dL (ref 0.44–1.00)
GFR calc Af Amer: >60 mL/min (ref >60)
GFR calc non Af Amer: >60 mL/min (ref >60)
GLUCOSE: 106 mg/dL — AB (ref 65–99)
POTASSIUM: 4.2 mmol/L (ref 3.5–5.1)
SODIUM: 138 mmol/L (ref 135–145)
TOTAL PROTEIN: 9.1 g/dL — AB (ref 6.5–8.1)

## 2016-03-20 MED ORDER — HEPARIN SOD (PORK) LOCK FLUSH 100 UNIT/ML IV SOLN
500.0000 [IU] | Freq: Once | INTRAVENOUS | Status: AC | PRN
  Administered 2016-03-20: 500 [IU]

## 2016-03-20 MED ORDER — SODIUM CHLORIDE 0.9 % IV SOLN
Freq: Once | INTRAVENOUS | Status: AC
  Administered 2016-03-20: 10:00:00 via INTRAVENOUS
  Filled 2016-03-20: qty 1000

## 2016-03-20 MED ORDER — SODIUM CHLORIDE 0.9 % IV SOLN
240.0000 mg | Freq: Once | INTRAVENOUS | Status: AC
  Administered 2016-03-20: 240 mg via INTRAVENOUS
  Filled 2016-03-20: qty 4

## 2016-03-20 NOTE — Progress Notes (Signed)
Patient is here for follow up, she mentions she has a cold. No pain or discomfort today.

## 2016-03-20 NOTE — Assessment & Plan Note (Addendum)
#   Right lung ca- with recurrence [based on imaging]- metastases to right lung ~10-65m on OPDIVO s/p #4 cycles- CT OCT - increasing size of 2 lung nodules up to 167m If continue to increase will recommend RT eval.   # proceed with cycle # 6 today; will monitor with CT in 6 weeks, again  # Dysphagia- radiation stricture s/p dilatation- Jan 2017 [Dr.Oh]  esophagogram- smooth strciture- weight loss-sec to #1.reluctant to follow up.  # COPD/bronchitis- reocmmend nebs/ meds. Tessalon pearls. Benadryl for allergies.   # Epistaxis- intermittent. Recommend conservative measures saline spray.  # IDA- ? Etiology; s/p IV iron weekly x 3/4; last one today- Hb- improving.   # follow up in 2 weeks/ opdivo/ labs.

## 2016-03-20 NOTE — Progress Notes (Signed)
[place] OFFICE PROGRESS NOTE  Patient Care Team: [place] as PCP - General (General Practice)  No matching staging information was found for the patient.   Oncology History   Chief Complaint/Diagnosis:   1. 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 Faith Rogue, cardiothoracic surgeon) 2. Started on radiation and concurrent chemotherapy with carboplatinum Taxol from April of 2013 3. Finished radiation and chemotherapy with carboplatinum and Taxol in May of 2013 4. Stage IIIB (T4N3M0) NSCLC who completed a course of EBRT on Oct 07, 2011 with a total dose of 7000cGy.   5. Starting consolidation with 2 cycles of carboplatinum and Abraxane 6.patient is off chemotherapy since November of 2013.  Repeat CT scan shows stable disease(January, 2014) 7  Started been on Taxotere and  RAMICIRUMAB from June 07, 2013 8.chemotherapy with Taxotere  and  RAMUCIRUMAB discontinued in March of 2015 because of hemoptysis 9.Started [facility] April of 2015. 10NIVOLULAMABWas put on hold because of abnormal liver enzymes January of 2016  # AUG 2017- Right lung recurrence [2 nodules - ~1cm; stable lung mass/fibrosis]. START OPDIVO q 2W [small to Bx; s/p rad-onc eval]; opdivo x4- CT OCT 16th- PROGRESSION 2 lung nodules ~9m;   # MOLECULAR TESTING- ? Not enough tissue.      Carcinoma of lung ([place]   10/12/2014 Initial Diagnosis    Carcinoma of lung       Primary cancer of bronchus of right lower lobe ([place]     INTERVAL HISTORY:  Jeanette MIGNOGNA89y.o.  female pleasant patient above history of stage IV lung cancer squamous cell/ recurrent- Patient currently on Opdivo status post cycle #5-Is here for follow-up  She has not followed up with GI. He states her difficulty swallowing is improved. Chronic shortness of breath. Denies getting any worse. Chronic cough. No  headaches. Chest wall pain improved. Independent epistaxis. No hemoptysis.  REVIEW OF SYSTEMS:  A complete 10 point review of system is done which is negative except mentioned above/history of present illness.   PAST MEDICAL HISTORY :  Past Medical History:  Diagnosis Date  . Back pain, chronic   . [place] of [place] ([place] 10/12/2014  . Cough   . Depression   . Dyspnea   . Emphysema of lung ([place]   . Heart murmur   . Hep C w/o coma, chronic ([place]   . Lung cancer ([facility]   . Swallowing difficulty     PAST SURGICAL HISTORY :   Past Surgical History:  Procedure Laterality Date  . ESOPHAGOGASTRODUODENOSCOPY N/A 06/08/2015   Procedure: ESOPHAGOGASTRODUODENOSCOPY (EGD);  Surgeon: PHulen Luster MD;  [facility];  Service: Gastroenterology;  Laterality: N/A;  . HEMORROIDECTOMY    . LUNG BIOPSY    . TUBAL LIGATION      FAMILY HISTORY :   Family History  Problem Relation Age of Onset  . Breast cancer Neg Hx     SOCIAL HISTORY:   Social History  Substance Use Topics  . Smoking status: Current Every Day Smoker    Packs/day: 0.25    Types: Cigarettes  . Smokeless tobacco: Never Used  . Alcohol use Not on file    ALLERGIES:  has No Known Allergies.  MEDICATIONS:  Current Outpatient Prescriptions  Medication Sig Dispense Refill  . benzonatate (TESSALON PERLES) 100 MG capsule Take 1 capsule (100 mg total) by mouth 3 (three) times daily  as needed for cough. 20 capsule 0  . ipratropium-albuterol (DUONEB) 0.5-2.5 (3) MG/3ML SOLN Take 3 mLs by nebulization every 4 (four) hours as needed. 360 mL 3  . lidocaine-prilocaine (EMLA) cream Apply 1 application topically as needed. To port a cath site- 1 hr prior to port access    . traMADol (ULTRAM) 50 MG tablet Take 2 tablets (100 mg total) by mouth every 8 (eight) hours as needed. 90 tablet 0   No current facility-administered medications for this visit.     PHYSICAL EXAMINATION: ECOG PERFORMANCE STATUS: 0 - Asymptomatic  BP  (!) 139/93 (BP Location: Left Arm, Patient Position: Sitting)   Pulse 99   Temp 97.1 F (36.2 C) (Tympanic)   Resp 18   Wt 115 lb 9.6 oz (52.4 kg)   SpO2 100%   BMI 20.48 kg/m   Filed Weights   03/20/16 0913  Weight: 115 lb 9.6 oz (52.4 kg)    GENERAL: Well-nourished well-developed; Alert, no distress and comfortable.   Alone.  EYES: no pallor or icterus OROPHARYNX: no thrush or ulceration; good dentition  NECK: supple, no masses felt LYMPH:  no palpable lymphadenopathy in the cervical, axillary or inguinal regions LUNGS: Decreased air entry to auscultation bilaterally and  No wheeze or crackles HEART/CVS: regular rate & rhythm and no murmurs; No lower extremity edema ABDOMEN:abdomen soft, non-tender and normal bowel sounds Musculoskeletal:no cyanosis of digits and no clubbing  PSYCH: alert & oriented x 3 with fluent speech NEURO: no focal motor/sensory deficits SKIN:  no rashes or significant lesions  LABORATORY DATA:  I have reviewed the data as listed    Component Value Date/Time   NA 138 03/20/2016 0858   NA 135 08/29/2014 1457   K 4.2 03/20/2016 0858   K 3.7 08/29/2014 1457   CL 111 03/20/2016 0858   CL 105 08/29/2014 1457   CO2 22 03/20/2016 0858   CO2 24 08/29/2014 1457   GLUCOSE 106 (H) 03/20/2016 0858   GLUCOSE 98 08/29/2014 1457   BUN 12 03/20/2016 0858   BUN 8 08/29/2014 1457   CREATININE 0.58 03/20/2016 0858   CREATININE 0.64 08/29/2014 1457   CALCIUM 9.1 03/20/2016 0858   CALCIUM 9.0 08/29/2014 1457   PROT 9.1 (H) 03/20/2016 0858   PROT 9.3 (H) 08/29/2014 1457   ALBUMIN 3.7 03/20/2016 0858   ALBUMIN 3.6 08/29/2014 1457   AST 47 (H) 03/20/2016 0858   AST 111 (H) 08/29/2014 1457   ALT 24 03/20/2016 0858   ALT 78 (H) 08/29/2014 1457   ALKPHOS 132 (H) 03/20/2016 0858   ALKPHOS 168 (H) 08/29/2014 1457   BILITOT 0.3 03/20/2016 0858   BILITOT 0.6 08/29/2014 1457   GFRNONAA >60 03/20/2016 0858   GFRNONAA >60 08/29/2014 1457   GFRAA >60 03/20/2016  0858   GFRAA >60 08/29/2014 1457    No results found for: SPEP, UPEP  Lab Results  Component Value Date   WBC 4.6 03/20/2016   NEUTROABS 2.7 03/20/2016   HGB 11.8 (L) 03/20/2016   HCT 34.8 (L) 03/20/2016   MCV 81.7 03/20/2016   PLT 234 03/20/2016      Chemistry      Component Value Date/Time   NA 138 03/20/2016 0858   NA 135 08/29/2014 1457   K 4.2 03/20/2016 0858   K 3.7 08/29/2014 1457   CL 111 03/20/2016 0858   CL 105 08/29/2014 1457   CO2 22 03/20/2016 0858   CO2 24 08/29/2014 1457   BUN 12 03/20/2016 0858  BUN 8 08/29/2014 1457   CREATININE 0.58 03/20/2016 0858   CREATININE 0.64 08/29/2014 1457      Component Value Date/Time   CALCIUM 9.1 03/20/2016 0858   CALCIUM 9.0 08/29/2014 1457   ALKPHOS 132 (H) 03/20/2016 0858   ALKPHOS 168 (H) 08/29/2014 1457   AST 47 (H) 03/20/2016 0858   AST 111 (H) 08/29/2014 1457   ALT 24 03/20/2016 0858   ALT 78 (H) 08/29/2014 1457   BILITOT 0.3 03/20/2016 0858   BILITOT 0.6 08/29/2014 1457      IMPRESSION: Further enlargement of 2 spiculated pulmonary nodules in the right upper and lower lobes, consistent with progressive pulmonary metastases are metachronous primary bronchogenic carcinomas.  Stable masslike areas of fibrosis in the right lung apex and right lower lobe.  Stable mild subcarinal mediastinal lymphadenopathy.  Resolution of left lower lobe ground-glass pulmonary opacity, consistent with resolving infectious or inflammatory process.  Electronically Signed   By: Earle Gell M.D.   On: 03/02/2016 14:43  RADIOGRAPHIC STUDIES: I have personally reviewed the radiological images as listed and agreed with the findings in the report. No results found.   ASSESSMENT & PLAN:  Primary cancer of bronchus of right lower lobe ([place]) # Right lung ca- with recurrence [based on imaging]- metastases to right lung ~10-32m on OPDIVO s/p #4 cycles- CT OCT - increasing size of 2 lung nodules up to 122m If continue to  increase will recommend RT eval.   # proceed with cycle # 6 today; will monitor with CT in 6 weeks, again  # Dysphagia- radiation stricture s/p dilatation- Jan 2017 [Dr.Oh]  esophagogram- smooth strciture- weight loss-sec to #1.reluctant to follow up.  # COPD/bronchitis- reocmmend nebs/ meds. Tessalon pearls. Benadryl for allergies.   # Epistaxis- intermittent. Recommend conservative measures saline spray.  # IDA- ? Etiology; s/p IV iron weekly x 3/4; last one today- Hb- improving.   # follow up in 2 weeks/ opdivo/ labs.     No orders of the defined types were placed in this encounter.     GoCammie SickleMD 03/20/2016 5:48 PM

## 2016-03-25 ENCOUNTER — Ambulatory Visit: Payer: Medicare Other

## 2016-04-01 ENCOUNTER — Ambulatory Visit: Payer: Medicare Other | Admitting: Radiation Oncology

## 2016-04-03 ENCOUNTER — Inpatient Hospital Stay: Payer: Medicare Other

## 2016-04-03 ENCOUNTER — Telehealth: Payer: Self-pay | Admitting: Internal Medicine

## 2016-04-03 ENCOUNTER — Inpatient Hospital Stay (HOSPITAL_BASED_OUTPATIENT_CLINIC_OR_DEPARTMENT_OTHER): Payer: Medicare Other | Admitting: Internal Medicine

## 2016-04-03 VITALS — BP 150/87 | HR 87 | Temp 96.9°F | Resp 18 | Ht 63.0 in | Wt 111.4 lb

## 2016-04-03 DIAGNOSIS — Z5111 Encounter for antineoplastic chemotherapy: Secondary | ICD-10-CM | POA: Diagnosis not present

## 2016-04-03 DIAGNOSIS — C7801 Secondary malignant neoplasm of right lung: Secondary | ICD-10-CM | POA: Diagnosis not present

## 2016-04-03 DIAGNOSIS — J40 Bronchitis, not specified as acute or chronic: Secondary | ICD-10-CM | POA: Diagnosis not present

## 2016-04-03 DIAGNOSIS — F17210 Nicotine dependence, cigarettes, uncomplicated: Secondary | ICD-10-CM

## 2016-04-03 DIAGNOSIS — Z95828 Presence of other vascular implants and grafts: Secondary | ICD-10-CM

## 2016-04-03 DIAGNOSIS — R1310 Dysphagia, unspecified: Secondary | ICD-10-CM

## 2016-04-03 DIAGNOSIS — R040 Epistaxis: Secondary | ICD-10-CM

## 2016-04-03 DIAGNOSIS — Z79899 Other long term (current) drug therapy: Secondary | ICD-10-CM

## 2016-04-03 DIAGNOSIS — C3431 Malignant neoplasm of lower lobe, right bronchus or lung: Secondary | ICD-10-CM

## 2016-04-03 LAB — CBC WITH DIFFERENTIAL/PLATELET
BASOS ABS: 0 10*3/uL (ref 0–0.1)
BASOS PCT: 1 %
Eosinophils Absolute: 0.1 10*3/uL (ref 0–0.7)
Eosinophils Relative: 1 %
HEMATOCRIT: 36.6 % (ref 35.0–47.0)
HEMOGLOBIN: 12.3 g/dL (ref 12.0–16.0)
LYMPHS PCT: 19 %
Lymphs Abs: 0.8 10*3/uL — ABNORMAL LOW (ref 1.0–3.6)
MCH: 28.3 pg (ref 26.0–34.0)
MCHC: 33.7 g/dL (ref 32.0–36.0)
MCV: 84 fL (ref 80.0–100.0)
MONO ABS: 0.6 10*3/uL (ref 0.2–0.9)
Monocytes Relative: 15 %
NEUTROS ABS: 2.6 10*3/uL (ref 1.4–6.5)
NEUTROS PCT: 64 %
Platelets: 196 10*3/uL (ref 150–440)
RBC: 4.36 MIL/uL (ref 3.80–5.20)
RDW: 27.8 % — AB (ref 11.5–14.5)
WBC: 4.1 10*3/uL (ref 3.6–11.0)

## 2016-04-03 LAB — COMPREHENSIVE METABOLIC PANEL
ALBUMIN: 3.8 g/dL (ref 3.5–5.0)
ALT: 29 U/L (ref 14–54)
AST: 59 U/L — AB (ref 15–41)
Alkaline Phosphatase: 129 U/L — ABNORMAL HIGH (ref 38–126)
Anion gap: 7 (ref 5–15)
BILIRUBIN TOTAL: 0.5 mg/dL (ref 0.3–1.2)
BUN: 11 mg/dL (ref 6–20)
CO2: 21 mmol/L — ABNORMAL LOW (ref 22–32)
CREATININE: 0.63 mg/dL (ref 0.44–1.00)
Calcium: 9.1 mg/dL (ref 8.9–10.3)
Chloride: 107 mmol/L (ref 101–111)
GFR calc Af Amer: >60 mL/min (ref >60)
GLUCOSE: 110 mg/dL — AB (ref 65–99)
POTASSIUM: 3.7 mmol/L (ref 3.5–5.1)
Sodium: 135 mmol/L (ref 135–145)
TOTAL PROTEIN: 9 g/dL — AB (ref 6.5–8.1)

## 2016-04-03 MED ORDER — HEPARIN SOD (PORK) LOCK FLUSH 100 UNIT/ML IV SOLN
500.0000 [IU] | Freq: Once | INTRAVENOUS | Status: AC
  Administered 2016-04-03: 500 [IU] via INTRAVENOUS

## 2016-04-03 MED ORDER — SODIUM CHLORIDE 0.9 % IV SOLN
Freq: Once | INTRAVENOUS | Status: AC
  Administered 2016-04-03: 10:00:00 via INTRAVENOUS
  Filled 2016-04-03: qty 1000

## 2016-04-03 MED ORDER — SODIUM CHLORIDE 0.9 % IV SOLN
240.0000 mg | Freq: Once | INTRAVENOUS | Status: AC
  Administered 2016-04-03: 240 mg via INTRAVENOUS
  Filled 2016-04-03: qty 4

## 2016-04-03 MED ORDER — LIDOCAINE-PRILOCAINE 2.5-2.5 % EX CREA: 1.0000 "application " | TOPICAL_CREAM | CUTANEOUS | 3 refills | Status: DC | PRN

## 2016-04-03 MED ORDER — HEPARIN SOD (PORK) LOCK FLUSH 100 UNIT/ML IV SOLN
INTRAVENOUS | Status: AC
  Filled 2016-04-03: qty 5

## 2016-04-03 MED ORDER — SODIUM CHLORIDE 0.9% FLUSH
10.0000 mL | Freq: Once | INTRAVENOUS | Status: AC
  Administered 2016-04-03: 10 mL via INTRAVENOUS
  Filled 2016-04-03: qty 10

## 2016-04-03 NOTE — Progress Notes (Signed)
[place] OFFICE PROGRESS NOTE  Patient Care Team: [place] as PCP - General (General Practice)  No matching staging information was found for the patient.   Oncology History   Chief Complaint/Diagnosis:   1. 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 Faith Rogue, cardiothoracic surgeon) 2. Started on radiation and concurrent chemotherapy with carboplatinum Taxol from April of 2013 3. Finished radiation and chemotherapy with carboplatinum and Taxol in May of 2013 4. Stage IIIB (T4N3M0) NSCLC who completed a course of EBRT on Oct 07, 2011 with a total dose of 7000cGy.   5. Starting consolidation with 2 cycles of carboplatinum and Abraxane 6.patient is off chemotherapy since November of 2013.  Repeat CT scan shows stable disease(January, 2014) 7  Started been on Taxotere and  RAMICIRUMAB from June 07, 2013 8.chemotherapy with Taxotere  and  RAMUCIRUMAB discontinued in March of 2015 because of hemoptysis 9.Started [facility] April of 2015. 10NIVOLULAMABWas put on hold because of abnormal liver enzymes January of 2016  # AUG 2017- Right lung recurrence [2 nodules - ~1cm; stable lung mass/fibrosis]. START OPDIVO q 2W [small to Bx; s/p rad-onc eval]; opdivo x4- CT OCT 16th- PROGRESSION 2 lung nodules ~22m;   # MOLECULAR TESTING- ? Not enough tissue.      Carcinoma of lung ([place]   10/12/2014 Initial Diagnosis    Carcinoma of lung       Primary cancer of bronchus of right lower lobe ([facility])     INTERVAL HISTORY:  Jeanette LAPINE61y.o.  female pleasant patient above history of stage IV lung cancer squamous cell/ recurrent- Patient currently on Opdivo status post cycle #6 -Is here for follow-up  She continues to have chronic cough however no hemoptysis. Continues to have Chronic shortness of breath. Denies getting any worse.  No headaches. Chest  wall pain improved. .Marland Kitchen REVIEW OF SYSTEMS:  A complete 10 point review of system is done which is negative except mentioned above/history of present illness.   PAST MEDICAL HISTORY :  Past Medical History:  Diagnosis Date  . Back pain, chronic   . Carcinoma of lung ([place] 10/12/2014  . Cough   . Depression   . Dyspnea   . Emphysema of lung ([place]   . Heart murmur   . Hep C w/o coma, chronic ([place]   . Lung cancer ([facility]   . Swallowing difficulty     PAST SURGICAL HISTORY :   Past Surgical History:  Procedure Laterality Date  . ESOPHAGOGASTRODUODENOSCOPY N/A 06/08/2015   Procedure: ESOPHAGOGASTRODUODENOSCOPY (EGD);  Surgeon: PHulen Luster MD;  [facility];  Service: Gastroenterology;  Laterality: N/A;  . HEMORROIDECTOMY    . LUNG BIOPSY    . TUBAL LIGATION      FAMILY HISTORY :   Family History  Problem Relation Age of Onset  . Breast cancer Neg Hx     SOCIAL HISTORY:   Social History  Substance Use Topics  . Smoking status: Current Every Day Smoker    Packs/day: 0.25    Types: Cigarettes  . Smokeless tobacco: Never Used  . Alcohol use Not on file    ALLERGIES:  has No Known Allergies.  MEDICATIONS:  Current Outpatient Prescriptions  Medication Sig Dispense Refill  . ipratropium-albuterol (DUONEB) 0.5-2.5 (3) MG/3ML SOLN Take 3 mLs by nebulization every 4 (four) hours as needed. 360 mL 3  . lidocaine-prilocaine (  EMLA) cream Apply 1 application topically as needed. To port a cath site- 1 hr prior to port access 30 g 3  . traMADol (ULTRAM) 50 MG tablet Take 2 tablets (100 mg total) by mouth every 8 (eight) hours as needed. 90 tablet 0  . benzonatate (TESSALON PERLES) 100 MG capsule Take 1 capsule (100 mg total) by mouth 3 (three) times daily as needed for cough. (Patient not taking: Reported on 04/03/2016) 20 capsule 0   No current facility-administered medications for this visit.     PHYSICAL EXAMINATION: ECOG PERFORMANCE STATUS: 0 - Asymptomatic  BP (!)  150/87 (Patient Position: Sitting)   Pulse 87   Temp (!) 96.9 F (36.1 C) (Tympanic)   Resp 18   Ht '5\' 3"'$  (1.6 m)   Wt 111 lb 6.4 oz (50.5 kg)   BMI 19.73 kg/m   Filed Weights   04/03/16 0919  Weight: 111 lb 6.4 oz (50.5 kg)    GENERAL: Well-nourished well-developed; Alert, no distress and comfortable.   Alone.  EYES: no pallor or icterus OROPHARYNX: no thrush or ulceration; good dentition  NECK: supple, no masses felt LYMPH:  no palpable lymphadenopathy in the cervical, axillary or inguinal regions LUNGS: Decreased air entry to auscultation bilaterally and  No wheeze or crackles HEART/CVS: regular rate & rhythm and no murmurs; No lower extremity edema ABDOMEN:abdomen soft, non-tender and normal bowel sounds Musculoskeletal:no cyanosis of digits and no clubbing  PSYCH: alert & oriented x 3 with fluent speech NEURO: no focal motor/sensory deficits SKIN:  no rashes or significant lesions  LABORATORY DATA:  I have reviewed the data as listed    Component Value Date/Time   NA 135 04/03/2016 0858   NA 135 08/29/2014 1457   K 3.7 04/03/2016 0858   K 3.7 08/29/2014 1457   CL 107 04/03/2016 0858   CL 105 08/29/2014 1457   CO2 21 (L) 04/03/2016 0858   CO2 24 08/29/2014 1457   GLUCOSE 110 (H) 04/03/2016 0858   GLUCOSE 98 08/29/2014 1457   BUN 11 04/03/2016 0858   BUN 8 08/29/2014 1457   CREATININE 0.63 04/03/2016 0858   CREATININE 0.64 08/29/2014 1457   CALCIUM 9.1 04/03/2016 0858   CALCIUM 9.0 08/29/2014 1457   PROT 9.0 (H) 04/03/2016 0858   PROT 9.3 (H) 08/29/2014 1457   ALBUMIN 3.8 04/03/2016 0858   ALBUMIN 3.6 08/29/2014 1457   AST 59 (H) 04/03/2016 0858   AST 111 (H) 08/29/2014 1457   ALT 29 04/03/2016 0858   ALT 78 (H) 08/29/2014 1457   ALKPHOS 129 (H) 04/03/2016 0858   ALKPHOS 168 (H) 08/29/2014 1457   BILITOT 0.5 04/03/2016 0858   BILITOT 0.6 08/29/2014 1457   GFRNONAA >60 04/03/2016 0858   GFRNONAA >60 08/29/2014 1457   GFRAA >60 04/03/2016 0858   GFRAA  >60 08/29/2014 1457    No results found for: SPEP, UPEP  Lab Results  Component Value Date   WBC 4.1 04/03/2016   NEUTROABS 2.6 04/03/2016   HGB 12.3 04/03/2016   HCT 36.6 04/03/2016   MCV 84.0 04/03/2016   PLT 196 04/03/2016      Chemistry      Component Value Date/Time   NA 135 04/03/2016 0858   NA 135 08/29/2014 1457   K 3.7 04/03/2016 0858   K 3.7 08/29/2014 1457   CL 107 04/03/2016 0858   CL 105 08/29/2014 1457   CO2 21 (L) 04/03/2016 0858   CO2 24 08/29/2014 1457   BUN 11  04/03/2016 0858   BUN 8 08/29/2014 1457   CREATININE 0.63 04/03/2016 0858   CREATININE 0.64 08/29/2014 1457      Component Value Date/Time   CALCIUM 9.1 04/03/2016 0858   CALCIUM 9.0 08/29/2014 1457   ALKPHOS 129 (H) 04/03/2016 0858   ALKPHOS 168 (H) 08/29/2014 1457   AST 59 (H) 04/03/2016 0858   AST 111 (H) 08/29/2014 1457   ALT 29 04/03/2016 0858   ALT 78 (H) 08/29/2014 1457   BILITOT 0.5 04/03/2016 0858   BILITOT 0.6 08/29/2014 1457      IMPRESSION: Further enlargement of 2 spiculated pulmonary nodules in the right upper and lower lobes, consistent with progressive pulmonary metastases are metachronous primary bronchogenic carcinomas.  Stable masslike areas of fibrosis in the right lung apex and right lower lobe.  Stable mild subcarinal mediastinal lymphadenopathy.  Resolution of left lower lobe ground-glass pulmonary opacity, consistent with resolving infectious or inflammatory process.  Electronically Signed   By: Earle Gell M.D.   On: 03/02/2016 14:43  RADIOGRAPHIC STUDIES: I have personally reviewed the radiological images as listed and agreed with the findings in the report. No results found.   ASSESSMENT & PLAN:  Primary cancer of bronchus of right lower lobe ([place]) # Right lung ca- with recurrence [based on imaging]- metastases to right lung ~10-25m on OPDIVO s/p #4 cycles- CT 13th OCT - increasing size of 2 lung nodules up to 125m[RUL & RLL] .   # proceed  with cycle # 7 today; Will repeat scan in 2-3 months. If worse recommend RT  # COPD/bronchitis- reocmmend nebs/ meds. Tessalon pearls. Benadryl for allergies.   # IDA- ? Etiology; s/p IV iron weekly x 4; improved; Hb 11.   # follow up in 2 weeks/ opdivo/ labs.     No orders of the defined types were placed in this encounter.     GoCammie SickleMD 04/03/2016 12:22 PM

## 2016-04-03 NOTE — Telephone Encounter (Signed)
x

## 2016-04-03 NOTE — Assessment & Plan Note (Signed)
#   Right lung ca- with recurrence [based on imaging]- metastases to right lung ~10-49m on OPDIVO s/p #4 cycles- CT 13th OCT - increasing size of 2 lung nodules up to 168m[RUL & RLL] .   # proceed with cycle # 7 today; Will repeat scan in 2-3 months. If worse recommend RT  # COPD/bronchitis- reocmmend nebs/ meds. Tessalon pearls. Benadryl for allergies.   # IDA- ? Etiology; s/p IV iron weekly x 4; improved; Hb 11.   # follow up in 2 weeks/ opdivo/ labs.

## 2016-04-06 ENCOUNTER — Telehealth: Payer: Self-pay | Admitting: Gastroenterology

## 2016-04-06 NOTE — Telephone Encounter (Signed)
Pt rescheduled EGD to Dec 5th.

## 2016-04-06 NOTE — Telephone Encounter (Signed)
Patient wants to cancel her procedure tomorrow and reschedule

## 2016-04-14 ENCOUNTER — Encounter: Payer: Self-pay | Admitting: Radiation Oncology

## 2016-04-14 ENCOUNTER — Ambulatory Visit
Admission: RE | Admit: 2016-04-14 | Discharge: 2016-04-14 | Disposition: A | Discharge status: Home / Self Care | Payer: Medicare Other | Source: Ambulatory Visit | Attending: Radiation Oncology | Admitting: Radiation Oncology

## 2016-04-14 VITALS — BP 131/82 | HR 94 | Temp 97.5°F | Resp 20 | Wt 113.5 lb

## 2016-04-14 DIAGNOSIS — Z923 Personal history of irradiation: Secondary | ICD-10-CM | POA: Insufficient documentation

## 2016-04-14 DIAGNOSIS — Z9221 Personal history of antineoplastic chemotherapy: Secondary | ICD-10-CM | POA: Diagnosis not present

## 2016-04-14 DIAGNOSIS — R042 Hemoptysis: Secondary | ICD-10-CM | POA: Diagnosis not present

## 2016-04-14 DIAGNOSIS — R918 Other nonspecific abnormal finding of lung field: Secondary | ICD-10-CM | POA: Insufficient documentation

## 2016-04-14 DIAGNOSIS — R05 Cough: Secondary | ICD-10-CM | POA: Insufficient documentation

## 2016-04-14 DIAGNOSIS — Z85118 Personal history of other malignant neoplasm of bronchus and lung: Secondary | ICD-10-CM | POA: Insufficient documentation

## 2016-04-14 DIAGNOSIS — C3491 Malignant neoplasm of unspecified part of right bronchus or lung: Secondary | ICD-10-CM

## 2016-04-14 NOTE — Progress Notes (Signed)
Radiation Oncology Follow up Note  Name: Jeanette Jones   Date:   04/14/2016 MRN:  [id number] DOB: 09/06/1955    This 60 y.o. female presents to the clinic today for follow-up for 2 new lung lesions of the right lung suspicious for metastatic lung cancer.  REFERRING PROVIDER: [facility], [place]  HPI: Patient is a. 60 year old female treated back in 2013 for stage IIIB (T4 N3 M0) non-small cell lung cancer treated with concurrent chemoradiation. She has 2 right lung nodules which have shown interval growth and we are following. She starting to have some hemoptysis does have a chronic cough and is being closely followed by medical oncology. I saw her back in August 58 2017 at which time she was to undergo immunotherapy and we would continue to follow her lung masses. The been slowly progressing and she scheduled to have a repeat CT scan or PET CT scan in the next month. She seen today again for follow-up.  COMPLICATIONS OF TREATMENT: none  FOLLOW UP COMPLIANCE: keeps appointments   PHYSICAL EXAM:  BP 131/82   Pulse 94   Temp 97.5 F (36.4 C)   Resp 20   Wt 113 lb 8.6 oz (51.5 kg)   BMI 20.11 kg/m  Thin slightly cachectic female in NAD Well-developed well-nourished patient in NAD. HEENT reveals PERLA, EOMI, discs not visualized.  Oral cavity is clear. No oral mucosal lesions are identified. Neck is clear without evidence of cervical or supraclavicular adenopathy. Lungs are clear to A&P. Cardiac examination is essentially unremarkable with regular rate and rhythm without murmur rub or thrill. Abdomen is benign with no organomegaly or masses noted. Motor sensory and DTR levels are equal and symmetric in the upper and lower extremities. Cranial nerves II through XII are grossly intact. Proprioception is intact. No peripheral adenopathy or edema is identified. No motor or sensory levels are noted. Crude visual fields are within normal range.  RADIOLOGY RESULTS: Most recent CT scans reviewed  compatible with the above-stated findings.  PLAN: At this time I'll wait her next CT results. I do favor going ahead with probable SB RT to both lesions since they are note doubt metastatic lesions. I've set up a follow-up in 2 months will see patient immediately should her CT scan or PET CT scan demonstrate noticeable enlargement of these nodules. We'll also discussed the case personally with medical oncology. Patient knows to call sooner with any concerns.  I would like to take this opportunity to thank you for allowing me to participate in the care of your patient.Armstead Peaks., MD

## 2016-04-17 ENCOUNTER — Inpatient Hospital Stay: Payer: Medicare Other | Attending: Internal Medicine

## 2016-04-17 ENCOUNTER — Inpatient Hospital Stay: Payer: Medicare Other

## 2016-04-17 ENCOUNTER — Inpatient Hospital Stay (HOSPITAL_BASED_OUTPATIENT_CLINIC_OR_DEPARTMENT_OTHER): Payer: Medicare Other | Admitting: Internal Medicine

## 2016-04-17 VITALS — BP 157/90 | HR 78 | Temp 97.2°F | Resp 18 | Wt 117.2 lb

## 2016-04-17 DIAGNOSIS — C3431 Malignant neoplasm of lower lobe, right bronchus or lung: Secondary | ICD-10-CM | POA: Insufficient documentation

## 2016-04-17 DIAGNOSIS — R590 Localized enlarged lymph nodes: Secondary | ICD-10-CM

## 2016-04-17 DIAGNOSIS — J40 Bronchitis, not specified as acute or chronic: Secondary | ICD-10-CM | POA: Insufficient documentation

## 2016-04-17 DIAGNOSIS — F17210 Nicotine dependence, cigarettes, uncomplicated: Secondary | ICD-10-CM

## 2016-04-17 DIAGNOSIS — J439 Emphysema, unspecified: Secondary | ICD-10-CM | POA: Insufficient documentation

## 2016-04-17 DIAGNOSIS — Z5111 Encounter for antineoplastic chemotherapy: Secondary | ICD-10-CM | POA: Diagnosis present

## 2016-04-17 DIAGNOSIS — B182 Chronic viral hepatitis C: Secondary | ICD-10-CM | POA: Insufficient documentation

## 2016-04-17 DIAGNOSIS — R040 Epistaxis: Secondary | ICD-10-CM | POA: Diagnosis not present

## 2016-04-17 DIAGNOSIS — C7801 Secondary malignant neoplasm of right lung: Secondary | ICD-10-CM | POA: Insufficient documentation

## 2016-04-17 DIAGNOSIS — F329 Major depressive disorder, single episode, unspecified: Secondary | ICD-10-CM | POA: Diagnosis not present

## 2016-04-17 DIAGNOSIS — Z79899 Other long term (current) drug therapy: Secondary | ICD-10-CM | POA: Diagnosis not present

## 2016-04-17 DIAGNOSIS — Z923 Personal history of irradiation: Secondary | ICD-10-CM | POA: Insufficient documentation

## 2016-04-17 LAB — CBC WITH DIFFERENTIAL/PLATELET
BASOS ABS: 0.1 10*3/uL (ref 0–0.1)
BASOS PCT: 1 %
EOS ABS: 0.1 10*3/uL (ref 0–0.7)
Eosinophils Relative: 2 %
HCT: 36 % (ref 35.0–47.0)
Hemoglobin: 12.1 g/dL (ref 12.0–16.0)
Lymphocytes Relative: 27 %
Lymphs Abs: 1.1 10*3/uL (ref 1.0–3.6)
MCH: 28.9 pg (ref 26.0–34.0)
MCHC: 33.6 g/dL (ref 32.0–36.0)
MCV: 86.1 fL (ref 80.0–100.0)
MONO ABS: 0.6 10*3/uL (ref 0.2–0.9)
Monocytes Relative: 13 %
Neutro Abs: 2.5 10*3/uL (ref 1.4–6.5)
Neutrophils Relative %: 57 %
PLATELETS: 240 10*3/uL (ref 150–440)
RBC: 4.18 MIL/uL (ref 3.80–5.20)
RDW: 25.4 % — AB (ref 11.5–14.5)
WBC: 4.3 10*3/uL (ref 3.6–11.0)

## 2016-04-17 LAB — COMPREHENSIVE METABOLIC PANEL
ALT: 34 U/L (ref 14–54)
AST: 55 U/L — AB (ref 15–41)
Albumin: 3.5 g/dL (ref 3.5–5.0)
Alkaline Phosphatase: 135 U/L — ABNORMAL HIGH (ref 38–126)
Anion gap: 5 (ref 5–15)
BUN: 14 mg/dL (ref 6–20)
CHLORIDE: 111 mmol/L (ref 101–111)
CO2: 22 mmol/L (ref 22–32)
Calcium: 9.3 mg/dL (ref 8.9–10.3)
Creatinine, Ser: 0.5 mg/dL (ref 0.44–1.00)
GFR calc Af Amer: >60 mL/min (ref >60)
Glucose, Bld: 97 mg/dL (ref 65–99)
POTASSIUM: 4 mmol/L (ref 3.5–5.1)
SODIUM: 138 mmol/L (ref 135–145)
Total Bilirubin: 0.3 mg/dL (ref 0.3–1.2)
Total Protein: 8.5 g/dL — ABNORMAL HIGH (ref 6.5–8.1)

## 2016-04-17 MED ORDER — HEPARIN SOD (PORK) LOCK FLUSH 100 UNIT/ML IV SOLN: 500.0000 [IU] | Freq: Once | INTRAVENOUS | Status: DC | PRN

## 2016-04-17 MED ORDER — SODIUM CHLORIDE 0.9 % IV SOLN
240.0000 mg | Freq: Once | INTRAVENOUS | Status: AC
  Administered 2016-04-17: 240 mg via INTRAVENOUS
  Filled 2016-04-17: qty 4

## 2016-04-17 MED ORDER — SODIUM CHLORIDE 0.9 % IJ SOLN
10.0000 mL | Freq: Once | INTRAMUSCULAR | Status: AC
  Administered 2016-04-17: 10 mL via INTRAVENOUS
  Filled 2016-04-17: qty 10

## 2016-04-17 MED ORDER — SODIUM CHLORIDE 0.9 % IV SOLN
Freq: Once | INTRAVENOUS | Status: AC
  Administered 2016-04-17: 11:00:00 via INTRAVENOUS
  Filled 2016-04-17: qty 1000

## 2016-04-17 MED ORDER — HEPARIN SOD (PORK) LOCK FLUSH 100 UNIT/ML IV SOLN
500.0000 [IU] | Freq: Once | INTRAVENOUS | Status: AC
  Administered 2016-04-17: 500 [IU] via INTRAVENOUS

## 2016-04-17 NOTE — Progress Notes (Signed)
[place] OFFICE PROGRESS NOTE  Patient Care Team: [place] as PCP - General (General Practice)  No matching staging information was found for the patient.   Oncology History   Chief Complaint/Diagnosis:   1. 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 Faith Rogue, cardiothoracic surgeon) 2. Started on radiation and concurrent chemotherapy with carboplatinum Taxol from April of 2013 3. Finished radiation and chemotherapy with carboplatinum and Taxol in May of 2013 4. Stage IIIB (T4N3M0) NSCLC who completed a course of EBRT on Oct 07, 2011 with a total dose of 7000cGy.   5. Starting consolidation with 2 cycles of carboplatinum and Abraxane 6.patient is off chemotherapy since November of 2013.  Repeat CT scan shows stable disease(January, 2014) 7  Started been on Taxotere and  RAMICIRUMAB from June 07, 2013 8.chemotherapy with Taxotere  and  RAMUCIRUMAB discontinued in March of 2015 because of hemoptysis 9.Started [facility] April of 2015. 10NIVOLULAMABWas put on hold because of abnormal liver enzymes January of 2016  # AUG 2017- Right lung recurrence [2 nodules - ~1cm; stable lung mass/fibrosis]. START OPDIVO q 2W [small to Bx; s/p rad-onc eval]; opdivo x4- CT OCT 16th- PROGRESSION 2 lung nodules ~50m;   # MOLECULAR TESTING- ? Not enough tissue.      Carcinoma of lung ([place]   10/12/2014 Initial Diagnosis    Carcinoma of lung       Primary cancer of bronchus of right lower lobe ([place]     INTERVAL HISTORY:  Jeanette MOZINGO89y.o.  female pleasant patient above history of stage IV lung cancer squamous cell/ recurrent- [place] for [place]up.  Patient admits to intermittent epistaxis. She has been previously evaluated by ENT; had cautery done. Denies any diarrhea.  She continues to have chronic cough however no  hemoptysis. Continues to have Chronic shortness of breath. Denies getting any worse.    REVIEW OF SYSTEMS:  A complete 10 point review of system is done which is negative except mentioned above/history of present illness.   PAST MEDICAL HISTORY :  Past Medical History:  Diagnosis Date  . Back pain, chronic   . Carcinoma of lung (HH[place] 10/12/2014  . Cough   . Depression   . Dyspnea   . Emphysema of lung ([place]   . Heart murmur   . Hep C w/o coma, chronic ([place]   . Lung cancer ([place]   . Swallowing difficulty     PAST SURGICAL HISTORY :   Past Surgical History:  Procedure Laterality Date  . ESOPHAGOGASTRODUODENOSCOPY N/A 06/08/2015   Procedure: ESOPHAGOGASTRODUODENOSCOPY (EGD);  Surgeon: PHulen Luster MD;  [facility];  Service: Gastroenterology;  Laterality: N/A;  . HEMORROIDECTOMY    . LUNG BIOPSY    . TUBAL LIGATION      FAMILY HISTORY :   Family History  Problem Relation Age of Onset  . Breast cancer Neg Hx     SOCIAL HISTORY:   Social History  Substance Use Topics  . Smoking status: Current Every Day Smoker    Packs/day: 0.25    Types: Cigarettes  . Smokeless tobacco: Never Used  . Alcohol use Not on file    ALLERGIES:  has No Known Allergies.  MEDICATIONS:  Current Outpatient Prescriptions  Medication Sig Dispense Refill  . ipratropium-albuterol (DUONEB) 0.5-2.5 (3) MG/3ML SOLN Take 3 mLs by nebulization every 4 (four)  hours as needed. 360 mL 3  . traMADol (ULTRAM) 50 MG tablet Take 2 tablets (100 mg total) by mouth every 8 (eight) hours as needed. 90 tablet 0  . benzonatate (TESSALON PERLES) 100 MG capsule Take 1 capsule (100 mg total) by mouth 3 (three) times daily as needed for cough. (Patient not taking: Reported on 04/17/2016) 20 capsule 0  . lidocaine-prilocaine (EMLA) cream Apply 1 application topically as needed. To port a cath site- 1 hr prior to port access (Patient not taking: Reported on 04/17/2016) 30 g 3   No current facility-administered  medications for this visit.     PHYSICAL EXAMINATION: ECOG PERFORMANCE STATUS: 0 - Asymptomatic  BP (!) 157/90 (BP Location: Right Arm, Patient Position: Sitting)   Pulse 78   Temp 97.2 F (36.2 C) (Tympanic)   Resp 18   Wt 117 lb 3.2 oz (53.2 kg)   BMI 20.76 kg/m   Filed Weights   04/17/16 0936  Weight: 117 lb 3.2 oz (53.2 kg)    GENERAL: Well-nourished well-developed; Alert, no distress and comfortable.   Alone.  EYES: no pallor or icterus OROPHARYNX: no thrush or ulceration; good dentition  NECK: supple, no masses felt LYMPH:  no palpable lymphadenopathy in the cervical, axillary or inguinal regions LUNGS: Decreased air entry to auscultation bilaterally and  No wheeze or crackles HEART/CVS: regular rate & rhythm and no murmurs; No lower extremity edema ABDOMEN:abdomen soft, non-tender and normal bowel sounds Musculoskeletal:no cyanosis of digits and no clubbing  PSYCH: alert & oriented x 3 with fluent speech NEURO: no focal motor/sensory deficits SKIN:  no rashes or significant lesions  LABORATORY DATA:  I have reviewed the data as listed    Component Value Date/Time   NA 138 04/17/2016 0902   NA 135 08/29/2014 1457   K 4.0 04/17/2016 0902   K 3.7 08/29/2014 1457   CL 111 04/17/2016 0902   CL 105 08/29/2014 1457   CO2 22 04/17/2016 0902   CO2 24 08/29/2014 1457   GLUCOSE 97 04/17/2016 0902   GLUCOSE 98 08/29/2014 1457   BUN 14 04/17/2016 0902   BUN 8 08/29/2014 1457   CREATININE 0.50 04/17/2016 0902   CREATININE 0.64 08/29/2014 1457   CALCIUM 9.3 04/17/2016 0902   CALCIUM 9.0 08/29/2014 1457   PROT 8.5 (H) 04/17/2016 0902   PROT 9.3 (H) 08/29/2014 1457   ALBUMIN 3.5 04/17/2016 0902   ALBUMIN 3.6 08/29/2014 1457   AST 55 (H) 04/17/2016 0902   AST 111 (H) 08/29/2014 1457   ALT 34 04/17/2016 0902   ALT 78 (H) 08/29/2014 1457   ALKPHOS 135 (H) 04/17/2016 0902   ALKPHOS 168 (H) 08/29/2014 1457   BILITOT 0.3 04/17/2016 0902   BILITOT 0.6 08/29/2014 1457    GFRNONAA >60 04/17/2016 0902   GFRNONAA >60 08/29/2014 1457   GFRAA >60 04/17/2016 0902   GFRAA >60 08/29/2014 1457    No results found for: SPEP, UPEP  Lab Results  Component Value Date   WBC 4.3 04/17/2016   NEUTROABS 2.5 04/17/2016   HGB 12.1 04/17/2016   HCT 36.0 04/17/2016   MCV 86.1 04/17/2016   PLT 240 04/17/2016      Chemistry      Component Value Date/Time   NA 138 04/17/2016 0902   NA 135 08/29/2014 1457   K 4.0 04/17/2016 0902   K 3.7 08/29/2014 1457   CL 111 04/17/2016 0902   CL 105 08/29/2014 1457   CO2 22 04/17/2016 0902  CO2 24 08/29/2014 1457   BUN 14 04/17/2016 0902   BUN 8 08/29/2014 1457   CREATININE 0.50 04/17/2016 0902   CREATININE 0.64 08/29/2014 1457      Component Value Date/Time   CALCIUM 9.3 04/17/2016 0902   CALCIUM 9.0 08/29/2014 1457   ALKPHOS 135 (H) 04/17/2016 0902   ALKPHOS 168 (H) 08/29/2014 1457   AST 55 (H) 04/17/2016 0902   AST 111 (H) 08/29/2014 1457   ALT 34 04/17/2016 0902   ALT 78 (H) 08/29/2014 1457   BILITOT 0.3 04/17/2016 0902   BILITOT 0.6 08/29/2014 1457      IMPRESSION: Further enlargement of 2 spiculated pulmonary nodules in the right upper and lower lobes, consistent with progressive pulmonary metastases are metachronous primary bronchogenic carcinomas.  Stable masslike areas of fibrosis in the right lung apex and right lower lobe.  Stable mild subcarinal mediastinal lymphadenopathy.  Resolution of left lower lobe ground-glass pulmonary opacity, consistent with resolving infectious or inflammatory process.  Electronically Signed   By: Earle Gell M.D.   On: 03/02/2016 14:43  RADIOGRAPHIC STUDIES: I have personally reviewed the radiological images as listed and agreed with the findings in the report. No results found.   ASSESSMENT & PLAN:  Primary cancer of bronchus of right lower lobe ([place]) # Right lung ca- with recurrence [based on imaging]- metastases to right lung ~10-40m on OPDIVO s/p #4  cycles- CT 16th OCT - increasing size of 2 lung nodules up to 120m[RUL & RLL] .   # proceed with cycle # 8 today; will order CT scan at next visit;  If worse recommend RT  # COPD/bronchitis- reocmmend nebs atleast 2-3 times da y.   # Epistaxis/ previous Hx of epistaxis refer to Dr.Bennett  # IDA- ? Etiology; s/p IV iron weekly x 4; improved; Hb 12/ improved.   # follow up in 2 weeks/ opdivo/ labs. Will order CT at next visit.     Orders Placed This Encounter  Procedures  . Ambulatory referral to ENT    Referral Priority:   Urgent    Referral Type:   Consultation    Referral Reason:   Specialty Services Required    Referred to Provider:   PaClyde CanterburyMD    Requested Specialty:   Otolaryngology    Number of Visits Requested:   1 O[place]anMD 04/18/2016 11:55 AM

## 2016-04-17 NOTE — Assessment & Plan Note (Addendum)
#   Right lung ca- with recurrence [based on imaging]- metastases to right lung ~10-34m on OPDIVO s/p #4 cycles- CT 16th OCT - increasing size of 2 lung nodules up to 133m[RUL & RLL] .   # proceed with cycle # 8 today; will order CT scan at next visit;  If worse recommend RT  # COPD/bronchitis- reocmmend nebs atleast 2-3 times da y.   # Epistaxis/ previous Hx of epistaxis refer to Dr.Bennett  # IDA- ? Etiology; s/p IV iron weekly x 4; improved; Hb 12/ improved.   # follow up in 2 weeks/ opdivo/ labs. Will order CT at next visit.

## 2016-04-17 NOTE — Progress Notes (Signed)
Patient is here for follow up she is doing ok, she does have some shortness of breath.

## 2016-04-20 ENCOUNTER — Encounter: Payer: Self-pay | Admitting: *Deleted

## 2016-04-21 ENCOUNTER — Encounter: Payer: Self-pay | Admitting: Anesthesiology

## 2016-04-21 ENCOUNTER — Ambulatory Visit
Admission: RE | Admit: 2016-04-21 | Discharge: 2016-04-21 | Disposition: A | Discharge status: Home / Self Care | Payer: Medicare Other | Source: Ambulatory Visit | Attending: Gastroenterology | Admitting: Gastroenterology

## 2016-04-21 ENCOUNTER — Encounter
Admission: RE | Disposition: A | Discharge status: Home / Self Care | Payer: Self-pay | Source: Ambulatory Visit | Attending: Gastroenterology

## 2016-04-21 DIAGNOSIS — Z539 Procedure and treatment not carried out, unspecified reason: Secondary | ICD-10-CM | POA: Insufficient documentation

## 2016-04-21 SURGERY — ESOPHAGOGASTRODUODENOSCOPY (EGD) WITH PROPOFOL
Anesthesia: General

## 2016-04-21 MED ORDER — SODIUM CHLORIDE 0.9 % IV SOLN: INTRAVENOUS | Status: DC

## 2016-05-01 ENCOUNTER — Inpatient Hospital Stay: Payer: Medicare Other

## 2016-05-01 ENCOUNTER — Inpatient Hospital Stay (HOSPITAL_BASED_OUTPATIENT_CLINIC_OR_DEPARTMENT_OTHER): Payer: Medicare Other | Admitting: Internal Medicine

## 2016-05-01 VITALS — BP 109/69 | HR 103 | Temp 97.4°F | Wt 113.1 lb

## 2016-05-01 DIAGNOSIS — R040 Epistaxis: Secondary | ICD-10-CM | POA: Diagnosis not present

## 2016-05-01 DIAGNOSIS — Z5111 Encounter for antineoplastic chemotherapy: Secondary | ICD-10-CM | POA: Diagnosis not present

## 2016-05-01 DIAGNOSIS — F17210 Nicotine dependence, cigarettes, uncomplicated: Secondary | ICD-10-CM | POA: Diagnosis not present

## 2016-05-01 DIAGNOSIS — C3431 Malignant neoplasm of lower lobe, right bronchus or lung: Secondary | ICD-10-CM

## 2016-05-01 DIAGNOSIS — Z923 Personal history of irradiation: Secondary | ICD-10-CM

## 2016-05-01 DIAGNOSIS — J40 Bronchitis, not specified as acute or chronic: Secondary | ICD-10-CM

## 2016-05-01 DIAGNOSIS — Z79899 Other long term (current) drug therapy: Secondary | ICD-10-CM | POA: Diagnosis not present

## 2016-05-01 DIAGNOSIS — C7801 Secondary malignant neoplasm of right lung: Secondary | ICD-10-CM

## 2016-05-01 DIAGNOSIS — R590 Localized enlarged lymph nodes: Secondary | ICD-10-CM

## 2016-05-01 LAB — COMPREHENSIVE METABOLIC PANEL
ALBUMIN: 4 g/dL (ref 3.5–5.0)
ALT: 49 U/L (ref 14–54)
ANION GAP: 5 (ref 5–15)
AST: 89 U/L — ABNORMAL HIGH (ref 15–41)
Alkaline Phosphatase: 149 U/L — ABNORMAL HIGH (ref 38–126)
BILIRUBIN TOTAL: 0.5 mg/dL (ref 0.3–1.2)
BUN: 9 mg/dL (ref 6–20)
CHLORIDE: 108 mmol/L (ref 101–111)
CO2: 22 mmol/L (ref 22–32)
Calcium: 9.1 mg/dL (ref 8.9–10.3)
Creatinine, Ser: 0.54 mg/dL (ref 0.44–1.00)
GFR calc Af Amer: >60 mL/min (ref >60)
GLUCOSE: 108 mg/dL — AB (ref 65–99)
POTASSIUM: 3.9 mmol/L (ref 3.5–5.1)
Sodium: 135 mmol/L (ref 135–145)
TOTAL PROTEIN: 9.2 g/dL — AB (ref 6.5–8.1)

## 2016-05-01 LAB — CBC WITH DIFFERENTIAL/PLATELET
BASOS PCT: 1 %
Basophils Absolute: 0 10*3/uL (ref 0–0.1)
EOS PCT: 2 %
Eosinophils Absolute: 0.1 10*3/uL (ref 0–0.7)
HEMATOCRIT: 36.5 % (ref 35.0–47.0)
Hemoglobin: 12.4 g/dL (ref 12.0–16.0)
Lymphocytes Relative: 21 %
Lymphs Abs: 1 10*3/uL (ref 1.0–3.6)
MCH: 29.7 pg (ref 26.0–34.0)
MCHC: 34.1 g/dL (ref 32.0–36.0)
MCV: 87.1 fL (ref 80.0–100.0)
MONO ABS: 0.7 10*3/uL (ref 0.2–0.9)
MONOS PCT: 15 %
NEUTROS ABS: 2.9 10*3/uL (ref 1.4–6.5)
Neutrophils Relative %: 61 %
PLATELETS: 231 10*3/uL (ref 150–440)
RBC: 4.19 MIL/uL (ref 3.80–5.20)
RDW: 21.9 % — AB (ref 11.5–14.5)
WBC: 4.7 10*3/uL (ref 3.6–11.0)

## 2016-05-01 MED ORDER — SODIUM CHLORIDE 0.9 % IJ SOLN
10.0000 mL | Freq: Once | INTRAMUSCULAR | Status: AC
  Administered 2016-05-01: 10 mL via INTRAVENOUS
  Filled 2016-05-01: qty 10

## 2016-05-01 MED ORDER — HEPARIN SOD (PORK) LOCK FLUSH 100 UNIT/ML IV SOLN
500.0000 [IU] | Freq: Once | INTRAVENOUS | Status: AC
  Administered 2016-05-01: 500 [IU] via INTRAVENOUS

## 2016-05-01 MED ORDER — SODIUM CHLORIDE 0.9 % IV SOLN
240.0000 mg | Freq: Once | INTRAVENOUS | Status: AC
  Administered 2016-05-01: 240 mg via INTRAVENOUS
  Filled 2016-05-01: qty 24

## 2016-05-01 MED ORDER — SODIUM CHLORIDE 0.9 % IV SOLN
Freq: Once | INTRAVENOUS | Status: AC
  Administered 2016-05-01: 12:00:00 via INTRAVENOUS
  Filled 2016-05-01: qty 1000

## 2016-05-01 NOTE — Assessment & Plan Note (Addendum)
#   Right lung ca- with recurrence [based on imaging]- metastases to right lung ~10-63m on OPDIVO s/p #4 cycles- CT 16th OCT - increasing size of 2 lung nodules up to 166m[RUL & RLL] . Clinically no progression.   # proceed with cycle # 9 today; CT scan ordered today.  If worse recommend RT  # COPD/bronchitis- stable. reocmmend nebs atleast 2-3 times day.   # Epistaxis/ previous Hx of epistaxis awaiting appt with Dr.Bennett  # IDA- stable for now.   # follow up in 3 weeks/ opdivo/ labs. CT chest prior 2-3 days.

## 2016-05-01 NOTE — Progress Notes (Signed)
Patient here today for follow up on Primary cancer of bronchus of right lower lobe.  Patient has no new concerns today.

## 2016-05-01 NOTE — Progress Notes (Signed)
[place] OFFICE PROGRESS NOTE  Patient Care Team: [place] as PCP - General (General Practice)  No matching staging information was found for the patient.   Oncology History   Chief Complaint/Diagnosis:   1. 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 Faith Rogue, cardiothoracic surgeon) 2. Started on radiation and concurrent chemotherapy with carboplatinum Taxol from April of 2013 3. Finished radiation and chemotherapy with carboplatinum and Taxol in May of 2013 4. Stage IIIB (T4N3M0) NSCLC who completed a course of EBRT on Oct 07, 2011 with a total dose of 7000cGy.   5. Starting consolidation with 2 cycles of carboplatinum and Abraxane 6.patient is off chemotherapy since November of 2013.  Repeat CT scan shows stable disease(January, 2014) 7  Started been on Taxotere and  RAMICIRUMAB from June 07, 2013 8.chemotherapy with Taxotere  and  RAMUCIRUMAB discontinued in March of 2015 because of hemoptysis 9.Started [facility] April of 2015. 10NIVOLULAMABWas put on hold because of abnormal liver enzymes January of 2016  # AUG 2017- Right lung recurrence [2 nodules - ~1cm; stable lung mass/fibrosis]. START OPDIVO q 2W [small to Bx; s/p rad-onc eval]; opdivo x4- CT OCT 16th- PROGRESSION 2 lung nodules ~32m;   # MOLECULAR TESTING- ? Not enough tissue.      Carcinoma of lung ([place]   10/12/2014 Initial Diagnosis    Carcinoma of lung       Primary cancer of bronchus of right lower lobe ([place]     INTERVAL HISTORY:  Jeanette DIVIRGILIO89y.o.  female pleasant patient above history of stage IV lung cancer squamous cell/ recurrent- Patient currently on [place]Great Ne[place] PlazaIs here for follow-up.  Continues to have intermittent epistaxis. She is awaiting appointment with ENT next week. She continues to have chronic cough however no hemoptysis. Continues to have Chronic  shortness of breath. Denies getting any worse.   No headaches.  REVIEW OF SYSTEMS:  A complete 10 point review of system is done which is negative except mentioned above/history of present illness.   PAST MEDICAL HISTORY :  Past Medical History:  Diagnosis Date  . Back pain, chronic   . Carcinoma of lung ([place] 10/12/2014  . Cough   . Depression   . Dyspnea   . Emphysema of lung (HHypol[place]   . Heart murmur   . Hep C w/o coma, chronic (HDef[place]   . Lung cancer (HL[place] Alt[place]   . Swallowing difficulty     PAST SURGICAL HISTORY :   Past Surgical History:  Procedure Laterality Date  . ESOPHAGOGASTRODUODENOSCOPY N/A 06/08/2015   Procedure: ESOPHAGOGASTRODUODENOSCOPY (EGD);  Surgeon: PHulen Luster MD;  [facility];  Service: Gastroenterology;  Laterality: N/A;  . HEMORROIDECTOMY    . LUNG BIOPSY    . TUBAL LIGATION      FAMILY HISTORY :   Family History  Problem Relation Age of Onset  . Breast cancer Neg Hx     SOCIAL HISTORY:   Social History  Substance Use Topics  . Smoking status: Current Every Day Smoker    Packs/day: 0.25    Types: Cigarettes  . Smokeless tobacco: Never Used  . Alcohol use Yes     Comment: beer on saturday    ALLERGIES:  has No Known Allergies.  MEDICATIONS:  Current Outpatient Prescriptions  Medication Sig Dispense Refill  . ipratropium-albuterol (DUONEB) 0.5-2.5 (3) MG/3ML SOLN Take 3 mLs by nebulization every  4 (four) hours as needed. 360 mL 3  . lidocaine-prilocaine (EMLA) cream Apply 1 application topically as needed. To port a cath site- 1 hr prior to port access 30 g 3  . traMADol (ULTRAM) 50 MG tablet Take 2 tablets (100 mg total) by mouth every 8 (eight) hours as needed. 90 tablet 0   No current facility-administered medications for this visit.     PHYSICAL EXAMINATION: ECOG PERFORMANCE STATUS: 0 - Asymptomatic  BP 109/69 (BP Location: Right Arm, Patient Position: Sitting)   Pulse (!) 103   Temp 97.4 F (36.3 C) (Tympanic)   Wt 113 lb 2  oz (51.3 kg)   BMI 20.04 kg/m   Filed Weights   05/01/16 1049  Weight: 113 lb 2 oz (51.3 kg)    GENERAL: Well-nourished well-developed; Alert, no distress and comfortable.   Alone.  EYES: no pallor or icterus OROPHARYNX: no thrush or ulceration; good dentition  NECK: supple, no masses felt LYMPH:  no palpable lymphadenopathy in the cervical, axillary or inguinal regions LUNGS: Decreased air entry to auscultation bilaterally and  No wheeze or crackles HEART/CVS: regular rate & rhythm and no murmurs; No lower extremity edema ABDOMEN:abdomen soft, non-tender and normal bowel sounds Musculoskeletal:no cyanosis of digits and no clubbing  PSYCH: alert & oriented x 3 with fluent speech NEURO: no focal motor/sensory deficits SKIN:  no rashes or significant lesions  LABORATORY DATA:  I have reviewed the data as listed    Component Value Date/Time   NA 135 05/01/2016 1018   NA 135 08/29/2014 1457   K 3.9 05/01/2016 1018   K 3.7 08/29/2014 1457   CL 108 05/01/2016 1018   CL 105 08/29/2014 1457   CO2 22 05/01/2016 1018   CO2 24 08/29/2014 1457   GLUCOSE 108 (H) 05/01/2016 1018   GLUCOSE 98 08/29/2014 1457   BUN 9 05/01/2016 1018   BUN 8 08/29/2014 1457   CREATININE 0.54 05/01/2016 1018   CREATININE 0.64 08/29/2014 1457   CALCIUM 9.1 05/01/2016 1018   CALCIUM 9.0 08/29/2014 1457   PROT 9.2 (H) 05/01/2016 1018   PROT 9.3 (H) 08/29/2014 1457   ALBUMIN 4.0 05/01/2016 1018   ALBUMIN 3.6 08/29/2014 1457   AST 89 (H) 05/01/2016 1018   AST 111 (H) 08/29/2014 1457   ALT 49 05/01/2016 1018   ALT 78 (H) 08/29/2014 1457   ALKPHOS 149 (H) 05/01/2016 1018   ALKPHOS 168 (H) 08/29/2014 1457   BILITOT 0.5 05/01/2016 1018   BILITOT 0.6 08/29/2014 1457   GFRNONAA >60 05/01/2016 1018   GFRNONAA >60 08/29/2014 1457   GFRAA >60 05/01/2016 1018   GFRAA >60 08/29/2014 1457    No results found for: SPEP, UPEP  Lab Results  Component Value Date   WBC 4.7 05/01/2016   NEUTROABS 2.9  05/01/2016   HGB 12.4 05/01/2016   HCT 36.5 05/01/2016   MCV 87.1 05/01/2016   PLT 231 05/01/2016      Chemistry      Component Value Date/Time   NA 135 05/01/2016 1018   NA 135 08/29/2014 1457   K 3.9 05/01/2016 1018   K 3.7 08/29/2014 1457   CL 108 05/01/2016 1018   CL 105 08/29/2014 1457   CO2 22 05/01/2016 1018   CO2 24 08/29/2014 1457   BUN 9 05/01/2016 1018   BUN 8 08/29/2014 1457   CREATININE 0.54 05/01/2016 1018   CREATININE 0.64 08/29/2014 1457      Component Value Date/Time   CALCIUM 9.1  05/01/2016 1018   CALCIUM 9.0 08/29/2014 1457   ALKPHOS 149 (H) 05/01/2016 1018   ALKPHOS 168 (H) 08/29/2014 1457   AST 89 (H) 05/01/2016 1018   AST 111 (H) 08/29/2014 1457   ALT 49 05/01/2016 1018   ALT 78 (H) 08/29/2014 1457   BILITOT 0.5 05/01/2016 1018   BILITOT 0.6 08/29/2014 1457      IMPRESSION: Further enlargement of 2 spiculated pulmonary nodules in the right upper and lower lobes, consistent with progressive pulmonary metastases are metachronous primary bronchogenic carcinomas.  Stable masslike areas of fibrosis in the right lung apex and right lower lobe.  Stable mild subcarinal mediastinal lymphadenopathy.  Resolution of left lower lobe ground-glass pulmonary opacity, consistent with resolving infectious or inflammatory process.  Electronically Signed   By: Earle Gell M.D.   On: 03/02/2016 14:43  RADIOGRAPHIC STUDIES: I have personally reviewed the radiological images as listed and agreed with the findings in the report. No results found.   ASSESSMENT & PLAN:  Primary cancer of bronchus of right lower lobe ([place]) # Right lung ca- with recurrence [based on imaging]- metastases to right lung ~10-30m on OPDIVO s/p #4 cycles- CT 16th OCT - increasing size of 2 lung nodules up to 162m[RUL & RLL] . Clinically no progression.   # proceed with cycle # 9 today; CT scan ordered today.  If worse recommend RT  # COPD/bronchitis- stable. reocmmend nebs  atleast 2-3 times day.   # Epistaxis/ previous Hx of epistaxis awaiting appt with Dr.Bennett  # IDA- stable for now.   # follow up in 3 weeks/ opdivo/ labs. CT chest prior 2-3 days.    Orders Placed This Encounter  Procedures  . CT CHEST W CONTRAST    Standing Status:   Future    Standing Expiration Date:   07/01/2017    Order Specific Question:   Reason for Exam (SYMPTOM  OR DIAGNOSIS REQUIRED)    Answer:   lung cancer    Order Specific Question:   Is the patient pregnant?    Answer:   No    Order Specific Question:   [facility]    [facility]    GoCammie SickleMD 05/02/2016 11:49 AM

## 2016-05-20 ENCOUNTER — Ambulatory Visit
Admission: RE | Admit: 2016-05-20 | Discharge: 2016-05-20 | Disposition: A | Discharge status: Home / Self Care | Payer: Medicare Other | Source: Ambulatory Visit | Attending: Internal Medicine | Admitting: Internal Medicine

## 2016-05-20 DIAGNOSIS — C3431 Malignant neoplasm of lower lobe, right bronchus or lung: Secondary | ICD-10-CM | POA: Insufficient documentation

## 2016-05-20 DIAGNOSIS — R911 Solitary pulmonary nodule: Secondary | ICD-10-CM | POA: Diagnosis not present

## 2016-05-20 HISTORY — DX: Essential (primary) hypertension: I10

## 2016-05-20 MED ORDER — IOPAMIDOL (ISOVUE-300) INJECTION 61%
75.0000 mL | Freq: Once | INTRAVENOUS | Status: AC | PRN
  Administered 2016-05-20: 75 mL via INTRAVENOUS

## 2016-05-22 ENCOUNTER — Inpatient Hospital Stay: Payer: Medicare Other

## 2016-05-22 ENCOUNTER — Inpatient Hospital Stay: Payer: Medicare Other | Attending: Internal Medicine | Admitting: Internal Medicine

## 2016-05-22 DIAGNOSIS — F329 Major depressive disorder, single episode, unspecified: Secondary | ICD-10-CM | POA: Insufficient documentation

## 2016-05-22 DIAGNOSIS — B182 Chronic viral hepatitis C: Secondary | ICD-10-CM | POA: Insufficient documentation

## 2016-05-22 DIAGNOSIS — C3431 Malignant neoplasm of lower lobe, right bronchus or lung: Secondary | ICD-10-CM | POA: Insufficient documentation

## 2016-05-22 DIAGNOSIS — Z79899 Other long term (current) drug therapy: Secondary | ICD-10-CM | POA: Insufficient documentation

## 2016-05-22 DIAGNOSIS — I10 Essential (primary) hypertension: Secondary | ICD-10-CM | POA: Insufficient documentation

## 2016-05-22 DIAGNOSIS — Z9221 Personal history of antineoplastic chemotherapy: Secondary | ICD-10-CM | POA: Insufficient documentation

## 2016-05-22 DIAGNOSIS — J439 Emphysema, unspecified: Secondary | ICD-10-CM | POA: Insufficient documentation

## 2016-05-22 DIAGNOSIS — Z7189 Other specified counseling: Secondary | ICD-10-CM | POA: Insufficient documentation

## 2016-05-22 DIAGNOSIS — R040 Epistaxis: Secondary | ICD-10-CM | POA: Insufficient documentation

## 2016-05-22 DIAGNOSIS — Z5111 Encounter for antineoplastic chemotherapy: Secondary | ICD-10-CM | POA: Insufficient documentation

## 2016-05-22 DIAGNOSIS — Z923 Personal history of irradiation: Secondary | ICD-10-CM | POA: Insufficient documentation

## 2016-05-22 DIAGNOSIS — C7801 Secondary malignant neoplasm of right lung: Secondary | ICD-10-CM | POA: Diagnosis not present

## 2016-05-22 DIAGNOSIS — F17210 Nicotine dependence, cigarettes, uncomplicated: Secondary | ICD-10-CM | POA: Diagnosis not present

## 2016-05-22 LAB — COMPREHENSIVE METABOLIC PANEL
ALK PHOS: 127 U/L — AB (ref 38–126)
ALT: 34 U/L (ref 14–54)
ANION GAP: 4 — AB (ref 5–15)
AST: 50 U/L — ABNORMAL HIGH (ref 15–41)
Albumin: 3.4 g/dL — ABNORMAL LOW (ref 3.5–5.0)
BUN: 12 mg/dL (ref 6–20)
CALCIUM: 8.5 mg/dL — AB (ref 8.9–10.3)
CHLORIDE: 116 mmol/L — AB (ref 101–111)
CO2: 22 mmol/L (ref 22–32)
CREATININE: 0.64 mg/dL (ref 0.44–1.00)
Glucose, Bld: 99 mg/dL (ref 65–99)
Potassium: 3.6 mmol/L (ref 3.5–5.1)
Sodium: 142 mmol/L (ref 135–145)
Total Bilirubin: 0.2 mg/dL — ABNORMAL LOW (ref 0.3–1.2)
Total Protein: 8.5 g/dL — ABNORMAL HIGH (ref 6.5–8.1)

## 2016-05-22 LAB — CBC WITH DIFFERENTIAL/PLATELET
Basophils Absolute: 0.1 10*3/uL (ref 0–0.1)
Basophils Relative: 1 %
EOS PCT: 2 %
Eosinophils Absolute: 0.1 10*3/uL (ref 0–0.7)
HCT: 32.7 % — ABNORMAL LOW (ref 35.0–47.0)
Hemoglobin: 10.9 g/dL — ABNORMAL LOW (ref 12.0–16.0)
LYMPHS ABS: 1.5 10*3/uL (ref 1.0–3.6)
LYMPHS PCT: 34 %
MCH: 29.5 pg (ref 26.0–34.0)
MCHC: 33.4 g/dL (ref 32.0–36.0)
MCV: 88.3 fL (ref 80.0–100.0)
MONOS PCT: 12 %
Monocytes Absolute: 0.5 10*3/uL (ref 0.2–0.9)
Neutro Abs: 2.2 10*3/uL (ref 1.4–6.5)
Neutrophils Relative %: 51 %
PLATELETS: 250 10*3/uL (ref 150–440)
RBC: 3.7 MIL/uL — AB (ref 3.80–5.20)
RDW: 17.1 % — ABNORMAL HIGH (ref 11.5–14.5)
WBC: 4.4 10*3/uL (ref 3.6–11.0)

## 2016-05-22 MED ORDER — SODIUM CHLORIDE 0.9% FLUSH
10.0000 mL | INTRAVENOUS | Status: DC | PRN
  Administered 2016-05-22: 10 mL
  Filled 2016-05-22: qty 10

## 2016-05-22 MED ORDER — HEPARIN SOD (PORK) LOCK FLUSH 100 UNIT/ML IV SOLN
500.0000 [IU] | Freq: Once | INTRAVENOUS | Status: AC | PRN
  Administered 2016-05-22: 500 [IU]
  Filled 2016-05-22: qty 5

## 2016-05-22 MED ORDER — ONDANSETRON HCL 8 MG PO TABS: 8.0000 mg | ORAL_TABLET | Freq: Three times a day (TID) | ORAL | 1 refills | Status: AC | PRN

## 2016-05-22 MED ORDER — SODIUM CHLORIDE 0.9 % IV SOLN
240.0000 mg | Freq: Once | INTRAVENOUS | Status: AC
  Administered 2016-05-22: 240 mg via INTRAVENOUS
  Filled 2016-05-22: qty 20

## 2016-05-22 MED ORDER — FLUTICASONE-SALMETEROL 500-50 MCG/DOSE IN AEPB: 1.0000 | INHALATION_SPRAY | Freq: Two times a day (BID) | RESPIRATORY_TRACT | 3 refills | Status: DC

## 2016-05-22 MED ORDER — SODIUM CHLORIDE 0.9 % IV SOLN
Freq: Once | INTRAVENOUS | Status: AC
  Administered 2016-05-22: 10:00:00 via INTRAVENOUS
  Filled 2016-05-22: qty 1000

## 2016-05-22 MED ORDER — PROCHLORPERAZINE MALEATE 10 MG PO TABS: 10.0000 mg | ORAL_TABLET | Freq: Four times a day (QID) | ORAL | 1 refills | Status: AC | PRN

## 2016-05-22 NOTE — Progress Notes (Signed)
START OFF PATHWAY REGIMEN - Non-Small Cell Lung  Off Pathway: Carboplatin AUC=6 + Paclitaxel 175 mg/m2 q21 Days  [id number]:Carboplatin AUC=6 + Paclitaxel 175 mg/m2 q21 Days:   A cycle is every 21 days:     Paclitaxel (Taxol(R)) 175 mg/m2 in 500 mL NS IV over 3 hours day 1 Dose Mod: None     Carboplatin (Paraplatin(R)) AUC=6 in 250 mL NS IV over 1 hour day 1 Dose Mod: None Additional Orders: * All AUC calculations intended to be used in [employer] formula  **Always confirm dose/schedule in your pharmacy ordering system**    Patient Characteristics: Local Recurrence Check here if patient was staged using an edition prior to AJCC Staging - 8th Edition (i.e., prior to May 18, 2016)? false AJCC T Category: T4 Current Disease Status: Local Recurrence AJCC N Category: N2 AJCC M Category: M0 AJCC 8 Stage Grouping: IIIB  Intent of Therapy: Non-Curative / Palliative Intent, Discussed with Patient

## 2016-05-22 NOTE — Progress Notes (Signed)
Patient on plan of care prior to pathways. 

## 2016-05-22 NOTE — Assessment & Plan Note (Addendum)
#   Right lung ca- with recurrence [based on imaging]- metastases to right lung ~10-38m on OPDIVO s/p #10 cycles- CT Jan 2018- increasing size of 2 lung nodules up to 14-15 mm [RUL & RLL] .   # proceed with OPDIVO cycle # 10 today; labs-reviewed.   # Discussed use of carboplatin-Taxol chemotherapy starting in approximately 3 weeks.   Discussed the potential side effects including but not limited to-increasing fatigue, nausea vomiting, diarrhea, hair loss, sores in the mouth, increase risk of infection and also neuropathy.    # Reviewed/counselled regarding the goals of care- being palliative/usually indefinite-until progression or side effects. Goal is to maintain quality of life as the disease is incurable.   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  reocmmend continued nebs atleast 2-3 times day. Also added Advair.  # Epistaxis/ status post evaluation with ENT. Reviewed the note.  # follow up in 3 weeks/ carbo-taxol/ labs MD.   # I reviewed the blood work- with the patient in detail; also reviewed the imaging independently [as summarized above]; and with the patient in detail.

## 2016-05-22 NOTE — Progress Notes (Signed)
Patient here today for follow up.  Patient c/o mid back pain and right shoulder pain

## 2016-05-22 NOTE — Progress Notes (Signed)
[place] OFFICE PROGRESS NOTE  Patient Care Team: [place] as PCP - General (General Practice)  No matching staging information was found for the patient.   Oncology History   Chief Complaint/Diagnosis:   1. 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 Faith Rogue, cardiothoracic surgeon) 2. Started on radiation and concurrent chemotherapy with carboplatinum Taxol from April of 2013 3. Finished radiation and chemotherapy with carboplatinum and Taxol in May of 2013 4. Stage IIIB (T4N3M0) NSCLC who completed a course of EBRT on Oct 07, 2011 with a total dose of 7000cGy.   5. Starting consolidation with 2 cycles of carboplatinum and Abraxane 6.patient is off chemotherapy since November of 2013.  Repeat CT scan shows stable disease(January, 2014) 7  Started been on Taxotere and  RAMICIRUMAB from June 07, 2013 8.chemotherapy with Taxotere  and  RAMUCIRUMAB discontinued in March of 2015 because of hemoptysis 9.Started [facility] April of 2015. 10NIVOLULAMABWas put on hold because of abnormal liver enzymes January of 2016  # AUG 2017- Right lung recurrence [2 nodules - ~1cm; stable lung mass/fibrosis]. START OPDIVO q 2W [small to Bx; s/p rad-onc eval]; opdivo x4- CT OCT 16th- PROGRESSION 2 lung nodules ~47m;   # MOLECULAR TESTING- ? Not enough tissue.      Primary cancer of bronchus of right lower lobe ([place]     INTERVAL HISTORY:  Jeanette COWENS89y.o.  female pleasant patient above history of stage IV lung cancer squamous cell/ recurrent- Patient [place] on [place] for follow[place]up; And review results of the CT scan.  In the interim patient had been evaluated by ENT for epistaxis. Currently improved.  Patient complains of continued shortness of breath chronic. Chronic cough. No hemoptysis. Continues to chronic chest wall pain with cough.   No headaches.  REVIEW OF SYSTEMS:  A complete 10 point review of system is done which is negative except mentioned above/history of present illness.   PAST MEDICAL HISTORY :  Past Medical History:  Diagnosis Date  . Back pain, chronic   . Carcinoma of lung ([place] 10/12/2014  . Cough   . Depression   . Dyspnea   . Emphysema of lung ([place]   . Heart murmur   . Hep C w/o coma, chronic ([place]   . Hypertension   . Lung cancer ([place]   . Swallowing difficulty     PAST SURGICAL HISTORY :   Past Surgical History:  Procedure Laterality Date  . ESOPHAGOGASTRODUODENOSCOPY N/A 06/08/2015   Procedure: ESOPHAGOGASTRODUODENOSCOPY (EGD);  Surgeon: PHulen Luster MD;  [facility]  Service: Gastroenterology;  Laterality: N/A;  . HEMORROIDECTOMY    . LUNG BIOPSY    . TUBAL LIGATION      FAMILY HISTORY :   Family History  Problem Relation Age of Onset  . Breast cancer Neg Hx     SOCIAL HISTORY:   Social History  Substance Use Topics  . Smoking status: Current Every Day Smoker    Packs/day: 0.25    Types: Cigarettes  . Smokeless tobacco: Never Used  . Alcohol use Yes     Comment: beer on saturday    ALLERGIES:  has No Known Allergies.  MEDICATIONS:  Current Outpatient Prescriptions  Medication Sig Dispense Refill  . ipratropium-albuterol (DUONEB) 0.5-2.5 (3) MG/3ML SOLN Take 3 mLs by nebulization every 4 (four) hours as needed. 360 mL 3  .  lidocaine-prilocaine (EMLA) cream Apply 1 application topically as needed. To port a cath site- 1 hr prior to port access 30 g 3  . traMADol (ULTRAM) 50 MG tablet Take 2 tablets (100 mg total) by mouth every 8 (eight) hours as needed. 90 tablet 0  . Fluticasone-Salmeterol (ADVAIR DISKUS) 500-50 MCG/DOSE AEPB Inhale 1 puff into the lungs 2 (two) times daily. 1 each 3  . ondansetron (ZOFRAN) 8 MG tablet Take 1 tablet (8 mg total) by mouth every 8 (eight) hours as needed for nausea or vomiting (start 3 days; after chemo). 40 tablet 1  .  prochlorperazine (COMPAZINE) 10 MG tablet Take 1 tablet (10 mg total) by mouth every 6 (six) hours as needed for nausea or vomiting. 40 tablet 1   No current facility-administered medications for this visit.     PHYSICAL EXAMINATION: ECOG PERFORMANCE STATUS: 0 - Asymptomatic  BP 123/76 (BP Location: Right Arm, Patient Position: Sitting)   Pulse 84   Temp (!) 96.5 F (35.8 C) (Tympanic)   Wt 119 lb 4 oz (54.1 kg)   BMI 21.12 kg/m   Filed Weights   05/22/16 0916  Weight: 119 lb 4 oz (54.1 kg)    GENERAL: Well-nourished well-developed; Alert, no distress and comfortable.   Alone.  EYES: no pallor or icterus OROPHARYNX: no thrush or ulceration; good dentition  NECK: supple, no masses felt LYMPH:  no palpable lymphadenopathy in the cervical, axillary or inguinal regions LUNGS: Decreased air entry to auscultation bilaterally and  No wheeze or crackles HEART/CVS: regular rate & rhythm and no murmurs; No lower extremity edema ABDOMEN:abdomen soft, non-tender and normal bowel sounds Musculoskeletal:no cyanosis of digits and no clubbing  PSYCH: alert & oriented x 3 with fluent speech NEURO: no focal motor/sensory deficits SKIN:  no rashes or significant lesions  LABORATORY DATA:  I have reviewed the data as listed    Component Value Date/Time   NA 142 05/22/2016 0830   NA 135 08/29/2014 1457   K 3.6 05/22/2016 0830   K 3.7 08/29/2014 1457   CL 116 (H) 05/22/2016 0830   CL 105 08/29/2014 1457   CO2 22 05/22/2016 0830   CO2 24 08/29/2014 1457   GLUCOSE 99 05/22/2016 0830   GLUCOSE 98 08/29/2014 1457   BUN 12 05/22/2016 0830   BUN 8 08/29/2014 1457   CREATININE 0.64 05/22/2016 0830   CREATININE 0.64 08/29/2014 1457   CALCIUM 8.5 (L) 05/22/2016 0830   CALCIUM 9.0 08/29/2014 1457   PROT 8.5 (H) 05/22/2016 0830   PROT 9.3 (H) 08/29/2014 1457   ALBUMIN 3.4 (L) 05/22/2016 0830   ALBUMIN 3.6 08/29/2014 1457   AST 50 (H) 05/22/2016 0830   AST 111 (H) 08/29/2014 1457   ALT 34  05/22/2016 0830   ALT 78 (H) 08/29/2014 1457   ALKPHOS 127 (H) 05/22/2016 0830   ALKPHOS 168 (H) 08/29/2014 1457   BILITOT 0.2 (L) 05/22/2016 0830   BILITOT 0.6 08/29/2014 1457   GFRNONAA >60 05/22/2016 0830   GFRNONAA >60 08/29/2014 1457   GFRAA >60 05/22/2016 0830   GFRAA >60 08/29/2014 1457    No results found for: SPEP, UPEP  Lab Results  Component Value Date   WBC 4.4 05/22/2016   NEUTROABS 2.2 05/22/2016   HGB 10.9 (L) 05/22/2016   HCT 32.7 (L) 05/22/2016   MCV 88.3 05/22/2016   PLT 250 05/22/2016      Chemistry      Component Value Date/Time   NA 142 05/22/2016 0830  NA 135 08/29/2014 1457   K 3.6 05/22/2016 0830   K 3.7 08/29/2014 1457   CL 116 (H) 05/22/2016 0830   CL 105 08/29/2014 1457   CO2 22 05/22/2016 0830   CO2 24 08/29/2014 1457   BUN 12 05/22/2016 0830   BUN 8 08/29/2014 1457   CREATININE 0.64 05/22/2016 0830   CREATININE 0.64 08/29/2014 1457      Component Value Date/Time   CALCIUM 8.5 (L) 05/22/2016 0830   CALCIUM 9.0 08/29/2014 1457   ALKPHOS 127 (H) 05/22/2016 0830   ALKPHOS 168 (H) 08/29/2014 1457   AST 50 (H) 05/22/2016 0830   AST 111 (H) 08/29/2014 1457   ALT 34 05/22/2016 0830   ALT 78 (H) 08/29/2014 1457   BILITOT 0.2 (L) 05/22/2016 0830   BILITOT 0.6 08/29/2014 1457      IMPRESSION: 1. Mild Interval increase in size of RIGHT upper lobe pulmonary nodule. 2. Mild interval increase in size of RIGHT lower lobe pulmonary nodule. 3. Stable post radiation change in the RIGHT upper lobe and RIGHT lower lobe.   Electronically Signed   By: Suzy Bouchard M.D.   On: 05/20/2016 12:10  RADIOGRAPHIC STUDIES: I have personally reviewed the radiological images as listed and agreed with the findings in the report. No results found.   ASSESSMENT & PLAN:  Primary cancer of bronchus of right lower lobe ([place]) # Right lung ca- with recurrence [based on imaging]- metastases to right lung ~10-16m on OPDIVO s/p #10 cycles- CT Jan  2018- increasing size of 2 lung nodules up to 14-15 mm [RUL & RLL] .   # proceed with OPDIVO cycle # 10 today; labs-reviewed.   # Discussed use of carboplatin-Taxol chemotherapy starting in approximately 3 weeks.   Discussed the potential side effects including but not limited to-increasing fatigue, nausea vomiting, diarrhea, hair loss, sores in the mouth, increase risk of infection and also neuropathy.    # Reviewed/counselled regarding the goals of care- being palliative/usually indefinite-until progression or side effects. Goal is to maintain quality of life as the disease is incurable.   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  reocmmend continued nebs atleast 2-3 times day. Also added Advair.  # Epistaxis/ status post evaluation with ENT. Reviewed the note.  # follow up in 3 weeks/ carbo-taxol/ labs MD.   # I reviewed the blood work- with the patient in detail; also reviewed the imaging independently [as summarized above]; and with the patient in detail.    No orders of the defined types were placed in this encounter.     GCammie Sickle MD 05/22/2016 2:23 PM

## 2016-06-12 ENCOUNTER — Inpatient Hospital Stay: Payer: Medicare Other

## 2016-06-12 ENCOUNTER — Inpatient Hospital Stay (HOSPITAL_BASED_OUTPATIENT_CLINIC_OR_DEPARTMENT_OTHER): Payer: Medicare Other | Admitting: Internal Medicine

## 2016-06-12 VITALS — BP 124/83 | HR 60

## 2016-06-12 DIAGNOSIS — F17210 Nicotine dependence, cigarettes, uncomplicated: Secondary | ICD-10-CM

## 2016-06-12 DIAGNOSIS — Z923 Personal history of irradiation: Secondary | ICD-10-CM | POA: Diagnosis not present

## 2016-06-12 DIAGNOSIS — Z79899 Other long term (current) drug therapy: Secondary | ICD-10-CM

## 2016-06-12 DIAGNOSIS — Z5111 Encounter for antineoplastic chemotherapy: Secondary | ICD-10-CM | POA: Diagnosis not present

## 2016-06-12 DIAGNOSIS — R040 Epistaxis: Secondary | ICD-10-CM | POA: Diagnosis not present

## 2016-06-12 DIAGNOSIS — C3431 Malignant neoplasm of lower lobe, right bronchus or lung: Secondary | ICD-10-CM | POA: Diagnosis not present

## 2016-06-12 DIAGNOSIS — C7801 Secondary malignant neoplasm of right lung: Secondary | ICD-10-CM | POA: Diagnosis not present

## 2016-06-12 DIAGNOSIS — Z9221 Personal history of antineoplastic chemotherapy: Secondary | ICD-10-CM

## 2016-06-12 DIAGNOSIS — J439 Emphysema, unspecified: Secondary | ICD-10-CM

## 2016-06-12 LAB — CBC WITH DIFFERENTIAL/PLATELET
Basophils Absolute: 0 10*3/uL (ref 0–0.1)
Basophils Relative: 1 %
Eosinophils Absolute: 0.1 10*3/uL (ref 0–0.7)
Eosinophils Relative: 2 %
HEMATOCRIT: 33.7 % — AB (ref 35.0–47.0)
Hemoglobin: 11.4 g/dL — ABNORMAL LOW (ref 12.0–16.0)
LYMPHS PCT: 30 %
Lymphs Abs: 1.2 10*3/uL (ref 1.0–3.6)
MCH: 29.6 pg (ref 26.0–34.0)
MCHC: 33.9 g/dL (ref 32.0–36.0)
MCV: 87.2 fL (ref 80.0–100.0)
MONO ABS: 0.7 10*3/uL (ref 0.2–0.9)
MONOS PCT: 16 %
NEUTROS ABS: 2.1 10*3/uL (ref 1.4–6.5)
Neutrophils Relative %: 51 %
Platelets: 271 10*3/uL (ref 150–440)
RBC: 3.87 MIL/uL (ref 3.80–5.20)
RDW: 14.7 % — AB (ref 11.5–14.5)
WBC: 4.1 10*3/uL (ref 3.6–11.0)

## 2016-06-12 LAB — COMPREHENSIVE METABOLIC PANEL
ALBUMIN: 3.5 g/dL (ref 3.5–5.0)
ALT: 38 U/L (ref 14–54)
ANION GAP: 5 (ref 5–15)
AST: 58 U/L — AB (ref 15–41)
Alkaline Phosphatase: 135 U/L — ABNORMAL HIGH (ref 38–126)
BILIRUBIN TOTAL: 0.5 mg/dL (ref 0.3–1.2)
BUN: 12 mg/dL (ref 6–20)
CHLORIDE: 108 mmol/L (ref 101–111)
CO2: 22 mmol/L (ref 22–32)
Calcium: 9.2 mg/dL (ref 8.9–10.3)
Creatinine, Ser: 0.55 mg/dL (ref 0.44–1.00)
GFR calc Af Amer: >60 mL/min (ref >60)
Glucose, Bld: 105 mg/dL — ABNORMAL HIGH (ref 65–99)
POTASSIUM: 3.6 mmol/L (ref 3.5–5.1)
Sodium: 135 mmol/L (ref 135–145)
TOTAL PROTEIN: 8.6 g/dL — AB (ref 6.5–8.1)

## 2016-06-12 MED ORDER — SODIUM CHLORIDE 0.9 % IV SOLN
Freq: Once | INTRAVENOUS | Status: AC
  Administered 2016-06-12: 10:00:00 via INTRAVENOUS
  Filled 2016-06-12: qty 1000

## 2016-06-12 MED ORDER — SODIUM CHLORIDE 0.9 % IV SOLN
20.0000 mg | Freq: Once | INTRAVENOUS | Status: AC
  Administered 2016-06-12: 20 mg via INTRAVENOUS
  Filled 2016-06-12: qty 2

## 2016-06-12 MED ORDER — SODIUM CHLORIDE 0.9% FLUSH
10.0000 mL | INTRAVENOUS | Status: DC | PRN
  Administered 2016-06-12: 10 mL
  Filled 2016-06-12: qty 10

## 2016-06-12 MED ORDER — DIPHENHYDRAMINE HCL 50 MG/ML IJ SOLN
50.0000 mg | Freq: Once | INTRAMUSCULAR | Status: AC
  Administered 2016-06-12: 50 mg via INTRAVENOUS
  Filled 2016-06-12: qty 1

## 2016-06-12 MED ORDER — PALONOSETRON HCL INJECTION 0.25 MG/5ML
0.2500 mg | Freq: Once | INTRAVENOUS | Status: AC
  Administered 2016-06-12: 0.25 mg via INTRAVENOUS
  Filled 2016-06-12: qty 5

## 2016-06-12 MED ORDER — PACLITAXEL CHEMO INJECTION 300 MG/50ML
200.0000 mg/m2 | Freq: Once | INTRAVENOUS | Status: AC
  Administered 2016-06-12: 312 mg via INTRAVENOUS
  Filled 2016-06-12: qty 52

## 2016-06-12 MED ORDER — SODIUM CHLORIDE 0.9 % IV SOLN
452.5000 mg | Freq: Once | INTRAVENOUS | Status: AC
  Administered 2016-06-12: 450 mg via INTRAVENOUS
  Filled 2016-06-12: qty 45

## 2016-06-12 MED ORDER — HEPARIN SOD (PORK) LOCK FLUSH 100 UNIT/ML IV SOLN
500.0000 [IU] | Freq: Once | INTRAVENOUS | Status: AC | PRN
  Administered 2016-06-12: 500 [IU]
  Filled 2016-06-12: qty 5

## 2016-06-12 MED ORDER — FAMOTIDINE IN NACL 20-0.9 MG/50ML-% IV SOLN
20.0000 mg | Freq: Once | INTRAVENOUS | Status: AC
  Administered 2016-06-12: 20 mg via INTRAVENOUS
  Filled 2016-06-12: qty 50

## 2016-06-12 NOTE — Progress Notes (Signed)
[place] OFFICE PROGRESS NOTE  Patient Care Team: [place] as PCP - General (General Practice)  Cancer Staging No matching staging information was found for the patient.   Oncology History   Chief Complaint/Diagnosis:   1. 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 Faith Rogue, cardiothoracic surgeon) 2. Started on radiation and concurrent chemotherapy with carboplatinum Taxol from April of 2013 3. Finished radiation and chemotherapy with carboplatinum and Taxol in May of 2013 4. Stage IIIB (T4N3M0) NSCLC who completed a course of EBRT on Oct 07, 2011 with a total dose of 7000cGy.   5. Starting consolidation with 2 cycles of carboplatinum and Abraxane 6.patient is off chemotherapy since November of 2013.  Repeat CT scan shows stable disease(January, 2014) 7  Started been on Taxotere and  RAMICIRUMAB from June 07, 2013 8.chemotherapy with Taxotere  and  RAMUCIRUMAB discontinued in March of 2015 because of hemoptysis 9.[facility]. 10NIVOLULAMABWas put on hold because of abnormal liver enzymes January of 2016  # AUG 2017- Right lung recurrence [2 nodules - ~1cm; stable lung mass/fibrosis]. START OPDIVO q 2W [small to Bx; s/p rad-onc eval]; opdivo x4- CT OCT 16th- PROGRESSION 2 lung nodules ~12m;   # MOLECULAR TESTING- ? Not enough tissue.      [place] cancer of [place]us of right lower lobe ([place]     INTERVAL HISTORY:  Jeanette HOEFER89y.o.  female pleasant patient above history of stage IV lung cancer squamous cell/ recurrent- Patient to begin first cycle of carbo/taxol.   She still complains of daily epistaxis but easily stopped with pressure. Additionally she states sometimes when she coughs she coughs up pink tinged sputum but she thinks it could be reminisce from nose bleed that she has swallowed. She still  complains of chronic shortness of breath and chronic cough. She states that she forgot to pick up Advair but she will try to pick up today. Denies headaches.   REVIEW OF SYSTEMS:  A complete 10 point review of system is done which is negative except mentioned above/history of present illness.   PAST MEDICAL HISTORY :  Past Medical History:  Diagnosis Date  . Back pain, chronic   . Carcinoma of lung ([place] 10/12/2014  . Cough   . Depression   . Dyspnea   . Emphysema of lung ([place]   . Heart murmur   . Hep C w/o coma, chronic ([place]   . Hypertension   . Lung cancer ([facility]   . Swallowing difficulty     PAST SURGICAL HISTORY :   Past Surgical History:  Procedure Laterality Date  . ESOPHAGOGASTRODUODENOSCOPY N/A 06/08/2015   Procedure: ESOPHAGOGASTRODUODENOSCOPY (EGD);  Surgeon: PHulen Luster MD;  [facility];  Service: Gastroenterology;  Laterality: N/A;  . HEMORROIDECTOMY    . LUNG BIOPSY    . TUBAL LIGATION      FAMILY HISTORY :   Family History  Problem Relation Age of Onset  . Breast cancer Neg Hx     SOCIAL HISTORY:   Social History  Substance Use Topics  . Smoking status: Current Every Day Smoker    Packs/day: 0.25    Types: Cigarettes  . Smokeless tobacco: Never Used  . Alcohol use Yes     Comment: beer on saturday    ALLERGIES:  has No Known Allergies.  MEDICATIONS:  Current Outpatient Prescriptions  Medication  Sig Dispense Refill  . Fluticasone-Salmeterol (ADVAIR DISKUS) 500-50 MCG/DOSE AEPB Inhale 1 puff into the lungs 2 (two) times daily. 1 each 3  . ipratropium-albuterol (DUONEB) 0.5-2.5 (3) MG/3ML SOLN Take 3 mLs by nebulization every 4 (four) hours as needed. 360 mL 3  . lidocaine-prilocaine (EMLA) cream Apply 1 application topically as needed. To port a cath site- 1 hr prior to port access 30 g 3  . ondansetron (ZOFRAN) 8 MG tablet Take 1 tablet (8 mg total) by mouth every 8 (eight) hours as needed for nausea or vomiting (start 3 days; after  chemo). 40 tablet 1  . prochlorperazine (COMPAZINE) 10 MG tablet Take 1 tablet (10 mg total) by mouth every 6 (six) hours as needed for nausea or vomiting. 40 tablet 1  . traMADol (ULTRAM) 50 MG tablet Take 2 tablets (100 mg total) by mouth every 8 (eight) hours as needed. 90 tablet 0   No current facility-administered medications for this visit.     PHYSICAL EXAMINATION: ECOG PERFORMANCE STATUS: 0 - Asymptomatic  BP 137/79 (BP Location: Right Arm, Patient Position: Sitting)   Pulse 71   Temp (!) 96.1 F (35.6 C) (Tympanic)   Wt 118 lb (53.5 kg)   BMI 20.90 kg/m   Filed Weights   06/12/16 0929  Weight: 118 lb (53.5 kg)    GENERAL: Well-nourished well-developed; Alert, no distress and comfortable.   Alone.  EYES: no pallor or icterus OROPHARYNX: no thrush or ulceration; good dentition  NECK: supple, no masses felt LYMPH:  no palpable lymphadenopathy in the cervical, axillary or inguinal regions LUNGS: Decreased air entry to auscultation bilaterally and  No wheeze or crackles HEART/CVS: regular rate & rhythm and no murmurs; No lower extremity edema ABDOMEN:abdomen soft, non-tender and normal bowel sounds Musculoskeletal:no cyanosis of digits and no clubbing  PSYCH: alert & oriented x 3 with fluent speech NEURO: no focal motor/sensory deficits SKIN:  no rashes or significant lesions  LABORATORY DATA:  I have reviewed the data as listed    Component Value Date/Time   NA 135 06/12/2016 0850   NA 135 08/29/2014 1457   K 3.6 06/12/2016 0850   K 3.7 08/29/2014 1457   CL 108 06/12/2016 0850   CL 105 08/29/2014 1457   CO2 22 06/12/2016 0850   CO2 24 08/29/2014 1457   GLUCOSE 105 (H) 06/12/2016 0850   GLUCOSE 98 08/29/2014 1457   BUN 12 06/12/2016 0850   BUN 8 08/29/2014 1457   CREATININE 0.55 06/12/2016 0850   CREATININE 0.64 08/29/2014 1457   CALCIUM 9.2 06/12/2016 0850   CALCIUM 9.0 08/29/2014 1457   PROT 8.6 (H) 06/12/2016 0850   PROT 9.3 (H) 08/29/2014 1457    ALBUMIN 3.5 06/12/2016 0850   ALBUMIN 3.6 08/29/2014 1457   AST 58 (H) 06/12/2016 0850   AST 111 (H) 08/29/2014 1457   ALT 38 06/12/2016 0850   ALT 78 (H) 08/29/2014 1457   ALKPHOS 135 (H) 06/12/2016 0850   ALKPHOS 168 (H) 08/29/2014 1457   BILITOT 0.5 06/12/2016 0850   BILITOT 0.6 08/29/2014 1457   GFRNONAA >60 06/12/2016 0850   GFRNONAA >60 08/29/2014 1457   GFRAA >60 06/12/2016 0850   GFRAA >60 08/29/2014 1457    No results found for: SPEP, UPEP  Lab Results  Component Value Date   WBC 4.1 06/12/2016   NEUTROABS 2.1 06/12/2016   HGB 11.4 (L) 06/12/2016   HCT 33.7 (L) 06/12/2016   MCV 87.2 06/12/2016   PLT 271 06/12/2016  Chemistry      Component Value Date/Time   NA 135 06/12/2016 0850   NA 135 08/29/2014 1457   K 3.6 06/12/2016 0850   K 3.7 08/29/2014 1457   CL 108 06/12/2016 0850   CL 105 08/29/2014 1457   CO2 22 06/12/2016 0850   CO2 24 08/29/2014 1457   BUN 12 06/12/2016 0850   BUN 8 08/29/2014 1457   CREATININE 0.55 06/12/2016 0850   CREATININE 0.64 08/29/2014 1457      Component Value Date/Time   CALCIUM 9.2 06/12/2016 0850   CALCIUM 9.0 08/29/2014 1457   ALKPHOS 135 (H) 06/12/2016 0850   ALKPHOS 168 (H) 08/29/2014 1457   AST 58 (H) 06/12/2016 0850   AST 111 (H) 08/29/2014 1457   ALT 38 06/12/2016 0850   ALT 78 (H) 08/29/2014 1457   BILITOT 0.5 06/12/2016 0850   BILITOT 0.6 08/29/2014 1457      IMPRESSION: 1. Mild Interval increase in size of RIGHT upper lobe pulmonary nodule. 2. Mild interval increase in size of RIGHT lower lobe pulmonary nodule. 3. Stable post radiation change in the RIGHT upper lobe and RIGHT lower lobe.   Electronically Signed   By: Suzy Bouchard M.D.   On: 05/20/2016 12:10  RADIOGRAPHIC STUDIES: I have personally reviewed the radiological images as listed and agreed with the findings in the report. No results found.   ASSESSMENT & PLAN:  Primary cancer of bronchus of right lower lobe ([place]) # Right  lung ca- with recurrence [based on imaging]- metastases to right lung ~10-37m on OPDIVO s/p #10 cycles- CT Jan 2018- increasing size of 2 lung nodules up to 14-15 mm [RUL & RLL] .   # Proceed with carboplatin-Taxol chemotherapy today. Labs reviewed. Patient does not have any further questions. Potential side effects discussed.  # Reviewed/counselled regarding the goals of care- being palliative/usually indefinite-until progression or side effects. Goal is to maintain quality of life as the disease is incurable.   # COPD/bronchitis--Impoved. Encouraged her to pick up Advair that was prescribed at last visit. Continue nebs atleast 2-3 times day.   # Epistaxis/ status post evaluation with ENT. States she still has them daily but she is able to stop them.   # follow up in 10 days for labs (CBC, CMET) only and 3 weeks/ carbo-taxol/ labs (CBC, CMET)/ MD.     No orders of the defined types were placed in this encounter.     JJacquelin Hawking NP 06/12/2016 10:09 AM

## 2016-06-12 NOTE — Progress Notes (Signed)
Patient here today for follow up.   

## 2016-06-12 NOTE — Assessment & Plan Note (Addendum)
#   Right lung ca- with recurrence [based on imaging]- metastases to right lung ~10-72m on OPDIVO s/p #10 cycles- CT Jan 2018- increasing size of 2 lung nodules up to 14-15 mm [RUL & RLL] .   # Proceed with carboplatin-Taxol chemotherapy today. Labs reviewed. Patient does not have any further questions. Potential side effects discussed.  # Reviewed/counselled regarding the goals of care- being palliative/usually indefinite-until progression or side effects. Goal is to maintain quality of life as the disease is incurable.   # COPD/bronchitis--Impoved. Encouraged her to pick up Advair that was prescribed at last visit. Continue nebs atleast 2-3 times day.   # Epistaxis/ status post evaluation with ENT. States she still has them daily but she is able to stop them.   # follow up in 10 days for labs (CBC, CMET) only and 3 weeks/ carbo-taxol/ labs (CBC, CMET)/ MD.

## 2016-06-19 ENCOUNTER — Other Ambulatory Visit: Payer: Medicare Other

## 2016-06-22 ENCOUNTER — Telehealth: Payer: Self-pay | Admitting: *Deleted

## 2016-06-22 ENCOUNTER — Inpatient Hospital Stay: Payer: Medicare Other | Attending: Internal Medicine

## 2016-06-22 DIAGNOSIS — Z9221 Personal history of antineoplastic chemotherapy: Secondary | ICD-10-CM | POA: Insufficient documentation

## 2016-06-22 DIAGNOSIS — F329 Major depressive disorder, single episode, unspecified: Secondary | ICD-10-CM | POA: Diagnosis not present

## 2016-06-22 DIAGNOSIS — B182 Chronic viral hepatitis C: Secondary | ICD-10-CM | POA: Insufficient documentation

## 2016-06-22 DIAGNOSIS — Z923 Personal history of irradiation: Secondary | ICD-10-CM | POA: Diagnosis not present

## 2016-06-22 DIAGNOSIS — J439 Emphysema, unspecified: Secondary | ICD-10-CM | POA: Diagnosis not present

## 2016-06-22 DIAGNOSIS — C3431 Malignant neoplasm of lower lobe, right bronchus or lung: Secondary | ICD-10-CM | POA: Diagnosis not present

## 2016-06-22 DIAGNOSIS — C7801 Secondary malignant neoplasm of right lung: Secondary | ICD-10-CM | POA: Insufficient documentation

## 2016-06-22 DIAGNOSIS — F17210 Nicotine dependence, cigarettes, uncomplicated: Secondary | ICD-10-CM | POA: Insufficient documentation

## 2016-06-22 DIAGNOSIS — I10 Essential (primary) hypertension: Secondary | ICD-10-CM | POA: Diagnosis not present

## 2016-06-22 DIAGNOSIS — Z79899 Other long term (current) drug therapy: Secondary | ICD-10-CM | POA: Diagnosis not present

## 2016-06-22 DIAGNOSIS — Z5111 Encounter for antineoplastic chemotherapy: Secondary | ICD-10-CM | POA: Diagnosis present

## 2016-06-22 DIAGNOSIS — G629 Polyneuropathy, unspecified: Secondary | ICD-10-CM | POA: Insufficient documentation

## 2016-06-22 LAB — COMPREHENSIVE METABOLIC PANEL
ALK PHOS: 142 U/L — AB (ref 38–126)
ALT: 51 U/L (ref 14–54)
AST: 66 U/L — AB (ref 15–41)
Albumin: 3.7 g/dL (ref 3.5–5.0)
Anion gap: 5 (ref 5–15)
BUN: 9 mg/dL (ref 6–20)
CALCIUM: 8.9 mg/dL (ref 8.9–10.3)
CO2: 23 mmol/L (ref 22–32)
CREATININE: 0.54 mg/dL (ref 0.44–1.00)
Chloride: 110 mmol/L (ref 101–111)
GFR calc Af Amer: >60 mL/min (ref >60)
Glucose, Bld: 90 mg/dL (ref 65–99)
POTASSIUM: 3.6 mmol/L (ref 3.5–5.1)
Sodium: 138 mmol/L (ref 135–145)
TOTAL PROTEIN: 8.4 g/dL — AB (ref 6.5–8.1)
Total Bilirubin: 0.3 mg/dL (ref 0.3–1.2)

## 2016-06-22 LAB — CBC WITH DIFFERENTIAL/PLATELET
BASOS ABS: 0.1 10*3/uL (ref 0–0.1)
Basophils Relative: 3 %
Eosinophils Absolute: 0.1 10*3/uL (ref 0–0.7)
Eosinophils Relative: 4 %
HEMATOCRIT: 29.9 % — AB (ref 35.0–47.0)
HEMOGLOBIN: 10.2 g/dL — AB (ref 12.0–16.0)
LYMPHS PCT: 60 %
Lymphs Abs: 1.2 10*3/uL (ref 1.0–3.6)
MCH: 29.3 pg (ref 26.0–34.0)
MCHC: 33.9 g/dL (ref 32.0–36.0)
MCV: 86.4 fL (ref 80.0–100.0)
MONO ABS: 0.4 10*3/uL (ref 0.2–0.9)
Monocytes Relative: 19 %
NEUTROS ABS: 0.3 10*3/uL — AB (ref 1.4–6.5)
Neutrophils Relative %: 14 %
Platelets: 345 10*3/uL (ref 150–440)
RBC: 3.46 MIL/uL — ABNORMAL LOW (ref 3.80–5.20)
RDW: 14.9 % — AB (ref 11.5–14.5)
WBC: 2 10*3/uL — ABNORMAL LOW (ref 3.6–11.0)

## 2016-06-22 NOTE — Telephone Encounter (Signed)
1117am-Critical ANC called to [place] by Marc Morgans in cancer center lab. ANC 0.3. Read back process performed with lab tech. Dr. Rogue Bussing informed at 1130 am-read back process performed with md. Reviewed chart with md. Pt did not receive onpro at last treatment. md will add this to her orders for next visit. No need to put patient on any abx per md. Pt will need to placed on neutropenic precautions.  Contacted pt- Patient c/o intermittent Nose bleed with a head cold. Denies any fever or chills.   I asked patient to use normal saline for her dry nose bleed to see if this will help with her symptoms and head cold. I encouraged pt to call our office if fever, chills, mouth sores or signs of infection develops. Teach back process performed with patient.

## 2016-06-24 ENCOUNTER — Encounter: Payer: Self-pay | Admitting: Radiation Oncology

## 2016-06-24 ENCOUNTER — Ambulatory Visit
Admission: RE | Admit: 2016-06-24 | Discharge: 2016-06-24 | Disposition: A | Discharge status: Home / Self Care | Payer: Medicare Other | Source: Ambulatory Visit | Attending: Radiation Oncology | Admitting: Radiation Oncology

## 2016-06-24 VITALS — BP 136/83 | HR 124 | Temp 97.0°F | Resp 20 | Wt 119.9 lb

## 2016-06-24 DIAGNOSIS — C3491 Malignant neoplasm of unspecified part of right bronchus or lung: Secondary | ICD-10-CM | POA: Insufficient documentation

## 2016-06-24 DIAGNOSIS — J00 Acute nasopharyngitis [common cold]: Secondary | ICD-10-CM | POA: Diagnosis not present

## 2016-06-24 DIAGNOSIS — F17210 Nicotine dependence, cigarettes, uncomplicated: Secondary | ICD-10-CM | POA: Diagnosis not present

## 2016-06-24 NOTE — Progress Notes (Signed)
Radiation Oncology Follow up Note  Name: Jeanette Jones   Date:   06/24/2016 MRN:  [id number] DOB: [birth year]    This 61 y.o. female presents to the clinic today for follow-up for stage IV lung cancer.  REFERRING PROVIDER: [facility] [place]  HPI: Patient is a 61 year old female originally treated back with concurrent chemoradiation in 2013 for stage IIIB (T4 N3 M0) non-small cell lung cancer. She presented with 2 right lung nodules and was placed on immunotherapy.. I last saw her back in November and decided to reevaluate in several months with consideration of SB RT should these lesions progress. She had repeat CT scan performed early January 2018 showing mild interval increase in the right upper lobe and right lower lobe pulmonary nodules with stable radiation change in right upper lobe and right lower lobe. She's currently on  OPDIVO q 2W . She is doing fairly well she states she caught a cold from a family member which is lingering. She specifically denies hemoptysis chest tightness or productive cough.  COMPLICATIONS OF TREATMENT: none  FOLLOW UP COMPLIANCE: keeps appointments   PHYSICAL EXAM:  BP 136/83   Pulse (!) 124   Temp 97 F (36.1 C)   Resp 20   Wt 119 lb 14.9 oz (54.4 kg)   BMI 21.24 kg/m  Well-developed well-nourished patient in NAD. HEENT reveals PERLA, EOMI, discs not visualized.  Oral cavity is clear. No oral mucosal lesions are identified. Neck is clear without evidence of cervical or supraclavicular adenopathy. Lungs are clear to A&P. Cardiac examination is essentially unremarkable with regular rate and rhythm without murmur rub or thrill. Abdomen is benign with no organomegaly or masses noted. Motor sensory and DTR levels are equal and symmetric in the upper and lower extremities. Cranial nerves II through XII are grossly intact. Proprioception is intact. No peripheral adenopathy or edema is identified. No motor or sensory levels are noted. Crude visual fields are  within normal range.  RADIOLOGY RESULTS: Serial CT scans are reviewed  PLAN: At this time patient will continue on immunotherapy with OPDIVO q 2W . Do not see need with a mild interval increase in size to go ahead with SB RT at this time. I would reserve that she we see strong progression of disease and will discuss that personally with medical oncology. I've asked the patient to be seen in 6 months for follow-up. She knows to call sooner with any concerns.  I would like to take this opportunity to thank you for allowing me to participate in the care of your patient.Armstead Peaks., MD

## 2016-06-29 ENCOUNTER — Other Ambulatory Visit: Payer: Self-pay | Admitting: Internal Medicine

## 2016-06-29 DIAGNOSIS — D701 Agranulocytosis secondary to cancer chemotherapy: Secondary | ICD-10-CM

## 2016-06-29 DIAGNOSIS — C3431 Malignant neoplasm of lower lobe, right bronchus or lung: Secondary | ICD-10-CM

## 2016-06-29 DIAGNOSIS — T451X5A Adverse effect of antineoplastic and immunosuppressive drugs, initial encounter: Secondary | ICD-10-CM

## 2016-07-02 ENCOUNTER — Other Ambulatory Visit: Payer: Self-pay | Admitting: Physician Assistant

## 2016-07-02 DIAGNOSIS — Z1231 Encounter for screening mammogram for malignant neoplasm of breast: Secondary | ICD-10-CM

## 2016-07-03 ENCOUNTER — Inpatient Hospital Stay: Payer: Medicare Other

## 2016-07-03 ENCOUNTER — Inpatient Hospital Stay (HOSPITAL_BASED_OUTPATIENT_CLINIC_OR_DEPARTMENT_OTHER): Payer: Medicare Other | Admitting: Internal Medicine

## 2016-07-03 ENCOUNTER — Encounter: Payer: Self-pay | Admitting: Medical Oncology

## 2016-07-03 ENCOUNTER — Other Ambulatory Visit: Payer: Medicare Other

## 2016-07-03 ENCOUNTER — Emergency Department
Admission: EM | Admit: 2016-07-03 | Discharge: 2016-07-03 | Disposition: A | Discharge status: Home / Self Care | Payer: Medicare Other | Attending: Emergency Medicine | Admitting: Emergency Medicine

## 2016-07-03 ENCOUNTER — Other Ambulatory Visit: Payer: Self-pay

## 2016-07-03 ENCOUNTER — Ambulatory Visit: Payer: Medicare Other | Admitting: Internal Medicine

## 2016-07-03 VITALS — BP 127/80 | HR 105 | Temp 97.8°F | Resp 20 | Ht 63.0 in | Wt 120.2 lb

## 2016-07-03 VITALS — BP 78/50 | HR 145 | Temp 98.3°F | Resp 24

## 2016-07-03 DIAGNOSIS — J439 Emphysema, unspecified: Secondary | ICD-10-CM | POA: Diagnosis not present

## 2016-07-03 DIAGNOSIS — C7801 Secondary malignant neoplasm of right lung: Secondary | ICD-10-CM

## 2016-07-03 DIAGNOSIS — Z85118 Personal history of other malignant neoplasm of bronchus and lung: Secondary | ICD-10-CM | POA: Insufficient documentation

## 2016-07-03 DIAGNOSIS — G629 Polyneuropathy, unspecified: Secondary | ICD-10-CM

## 2016-07-03 DIAGNOSIS — I10 Essential (primary) hypertension: Secondary | ICD-10-CM | POA: Insufficient documentation

## 2016-07-03 DIAGNOSIS — F17210 Nicotine dependence, cigarettes, uncomplicated: Secondary | ICD-10-CM | POA: Insufficient documentation

## 2016-07-03 DIAGNOSIS — Z79899 Other long term (current) drug therapy: Secondary | ICD-10-CM | POA: Insufficient documentation

## 2016-07-03 DIAGNOSIS — Z923 Personal history of irradiation: Secondary | ICD-10-CM

## 2016-07-03 DIAGNOSIS — T7840XA Allergy, unspecified, initial encounter: Secondary | ICD-10-CM | POA: Diagnosis present

## 2016-07-03 DIAGNOSIS — T782XXA Anaphylactic shock, unspecified, initial encounter: Secondary | ICD-10-CM | POA: Insufficient documentation

## 2016-07-03 DIAGNOSIS — Z9221 Personal history of antineoplastic chemotherapy: Secondary | ICD-10-CM | POA: Diagnosis not present

## 2016-07-03 DIAGNOSIS — C3431 Malignant neoplasm of lower lobe, right bronchus or lung: Secondary | ICD-10-CM

## 2016-07-03 LAB — CBC WITH DIFFERENTIAL/PLATELET
BASOS PCT: 1 %
Basophils Absolute: 0.1 10*3/uL (ref 0–0.1)
Basophils Absolute: 0 10*3/uL (ref 0–0.1)
Basophils Relative: 0 %
EOS ABS: 0 10*3/uL (ref 0–0.7)
EOS PCT: 0 %
Eosinophils Absolute: 0 10*3/uL (ref 0–0.7)
Eosinophils Relative: 1 %
HCT: 29.1 % — ABNORMAL LOW (ref 35.0–47.0)
HCT: 29.5 % — ABNORMAL LOW (ref 35.0–47.0)
HEMOGLOBIN: 10.1 g/dL — AB (ref 12.0–16.0)
Hemoglobin: 9.9 g/dL — ABNORMAL LOW (ref 12.0–16.0)
LYMPHS ABS: 1.6 10*3/uL (ref 1.0–3.6)
LYMPHS ABS: 0.3 10*3/uL — AB (ref 1.0–3.6)
Lymphocytes Relative: 28 %
Lymphocytes Relative: 2 %
MCH: 29.1 pg (ref 26.0–34.0)
MCH: 28.8 pg (ref 26.0–34.0)
MCHC: 34.5 g/dL (ref 32.0–36.0)
MCHC: 33.5 g/dL (ref 32.0–36.0)
MCV: 84.3 fL (ref 80.0–100.0)
MCV: 86.1 fL (ref 80.0–100.0)
MONOS PCT: 19 %
Monocytes Absolute: 1.1 10*3/uL — ABNORMAL HIGH (ref 0.2–0.9)
Monocytes Absolute: 0.2 10*3/uL (ref 0.2–0.9)
Monocytes Relative: 1 %
NEUTROS ABS: 3.1 10*3/uL (ref 1.4–6.5)
NEUTROS PCT: 51 %
Neutro Abs: 15.2 10*3/uL — ABNORMAL HIGH (ref 1.4–6.5)
Neutrophils Relative %: 97 %
PLATELETS: 313 10*3/uL (ref 150–440)
Platelets: 325 10*3/uL (ref 150–440)
RBC: 3.45 MIL/uL — ABNORMAL LOW (ref 3.80–5.20)
RBC: 3.43 MIL/uL — ABNORMAL LOW (ref 3.80–5.20)
RDW: 15.2 % — ABNORMAL HIGH (ref 11.5–14.5)
RDW: 15.6 % — ABNORMAL HIGH (ref 11.5–14.5)
WBC: 5.9 10*3/uL (ref 3.6–11.0)
WBC: 15.7 10*3/uL — ABNORMAL HIGH (ref 3.6–11.0)

## 2016-07-03 LAB — BASIC METABOLIC PANEL
Anion gap: 7 (ref 5–15)
BUN: 13 mg/dL (ref 6–20)
CHLORIDE: 111 mmol/L (ref 101–111)
CO2: 19 mmol/L — ABNORMAL LOW (ref 22–32)
CREATININE: 0.68 mg/dL (ref 0.44–1.00)
Calcium: 8.2 mg/dL — ABNORMAL LOW (ref 8.9–10.3)
GFR calc Af Amer: >60 mL/min (ref >60)
GLUCOSE: 159 mg/dL — AB (ref 65–99)
Potassium: 3.8 mmol/L (ref 3.5–5.1)
SODIUM: 137 mmol/L (ref 135–145)

## 2016-07-03 LAB — COMPREHENSIVE METABOLIC PANEL
ALBUMIN: 3.5 g/dL (ref 3.5–5.0)
ALK PHOS: 178 U/L — AB (ref 38–126)
ALT: 38 U/L (ref 14–54)
ANION GAP: 6 (ref 5–15)
AST: 57 U/L — ABNORMAL HIGH (ref 15–41)
BILIRUBIN TOTAL: 0.4 mg/dL (ref 0.3–1.2)
BUN: 16 mg/dL (ref 6–20)
CALCIUM: 8.7 mg/dL — AB (ref 8.9–10.3)
CO2: 21 mmol/L — ABNORMAL LOW (ref 22–32)
CREATININE: 0.58 mg/dL (ref 0.44–1.00)
Chloride: 106 mmol/L (ref 101–111)
GFR calc Af Amer: >60 mL/min (ref >60)
GFR calc non Af Amer: >60 mL/min (ref >60)
GLUCOSE: 101 mg/dL — AB (ref 65–99)
Potassium: 3.6 mmol/L (ref 3.5–5.1)
Sodium: 133 mmol/L — ABNORMAL LOW (ref 135–145)
TOTAL PROTEIN: 9 g/dL — AB (ref 6.5–8.1)

## 2016-07-03 LAB — TROPONIN I

## 2016-07-03 MED ORDER — DIPHENHYDRAMINE HCL 50 MG/ML IJ SOLN
50.0000 mg | Freq: Once | INTRAMUSCULAR | Status: AC
  Administered 2016-07-03: 50 mg via INTRAVENOUS
  Filled 2016-07-03: qty 1

## 2016-07-03 MED ORDER — HYDROCORTISONE NA SUCCINATE PF 100 MG IJ SOLR
200.0000 mg | Freq: Once | INTRAMUSCULAR | Status: AC
  Administered 2016-07-03: 200 mg via INTRAVENOUS

## 2016-07-03 MED ORDER — SODIUM CHLORIDE 0.9 % IV SOLN
300.0000 mg | Freq: Once | INTRAVENOUS | Status: AC
  Administered 2016-07-03: 300 mg via INTRAVENOUS
  Filled 2016-07-03: qty 50

## 2016-07-03 MED ORDER — SODIUM CHLORIDE 0.9 % IV SOLN
452.5000 mg | Freq: Once | INTRAVENOUS | Status: AC
  Administered 2016-07-03: 450 mg via INTRAVENOUS
  Filled 2016-07-03: qty 45

## 2016-07-03 MED ORDER — HEPARIN SOD (PORK) LOCK FLUSH 100 UNIT/ML IV SOLN
INTRAVENOUS | Status: AC
  Administered 2016-07-03: 500 [IU] via INTRAVENOUS
  Filled 2016-07-03: qty 5

## 2016-07-03 MED ORDER — PEGFILGRASTIM 6 MG/0.6ML ~~LOC~~ PSKT
6.0000 mg | PREFILLED_SYRINGE | Freq: Once | SUBCUTANEOUS | Status: DC
  Filled 2016-07-03: qty 0.6

## 2016-07-03 MED ORDER — FAMOTIDINE IN NACL 20-0.9 MG/50ML-% IV SOLN
20.0000 mg | Freq: Once | INTRAVENOUS | Status: AC
  Administered 2016-07-03: 20 mg via INTRAVENOUS
  Filled 2016-07-03: qty 50

## 2016-07-03 MED ORDER — ONDANSETRON HCL 4 MG/2ML IJ SOLN
4.0000 mg | Freq: Once | INTRAMUSCULAR | Status: AC
  Administered 2016-07-03: 4 mg via INTRAVENOUS
  Filled 2016-07-03: qty 2

## 2016-07-03 MED ORDER — SODIUM CHLORIDE 0.9 % IV SOLN
20.0000 mg | Freq: Once | INTRAVENOUS | Status: AC
  Administered 2016-07-03: 20 mg via INTRAVENOUS
  Filled 2016-07-03: qty 2

## 2016-07-03 MED ORDER — HEPARIN SOD (PORK) LOCK FLUSH 100 UNIT/ML IV SOLN
250.0000 [IU] | Freq: Once | INTRAVENOUS | Status: AC | PRN
  Filled 2016-07-03: qty 5

## 2016-07-03 MED ORDER — SODIUM CHLORIDE 0.9 % IV SOLN
200.0000 mg/m2 | Freq: Once | INTRAVENOUS | Status: DC
  Filled 2016-07-03: qty 52

## 2016-07-03 MED ORDER — HEPARIN SOD (PORK) LOCK FLUSH 100 UNIT/ML IV SOLN
500.0000 [IU] | Freq: Once | INTRAVENOUS | Status: AC
  Administered 2016-07-03: 500 [IU] via INTRAVENOUS

## 2016-07-03 MED ORDER — HEPARIN SOD (PORK) LOCK FLUSH 100 UNIT/ML IV SOLN: 500.0000 [IU] | Freq: Once | INTRAVENOUS | Status: DC

## 2016-07-03 MED ORDER — SODIUM CHLORIDE 0.9 % IV SOLN
Freq: Once | INTRAVENOUS | Status: AC
  Administered 2016-07-03: 10:00:00 via INTRAVENOUS
  Filled 2016-07-03: qty 1000

## 2016-07-03 MED ORDER — FAMOTIDINE IN NACL 20-0.9 MG/50ML-% IV SOLN
20.0000 mg | Freq: Two times a day (BID) | INTRAVENOUS | Status: DC
  Administered 2016-07-03: 20 mg via INTRAVENOUS

## 2016-07-03 MED ORDER — PALONOSETRON HCL INJECTION 0.25 MG/5ML
0.2500 mg | Freq: Once | INTRAVENOUS | Status: AC
  Administered 2016-07-03: 0.25 mg via INTRAVENOUS
  Filled 2016-07-03: qty 5

## 2016-07-03 MED ORDER — PREDNISOLONE 15 MG/5ML PO SOLN: 50.0000 mg | Freq: Every day | ORAL | 0 refills | Status: AC

## 2016-07-03 MED ORDER — EPINEPHRINE 0.3 MG/0.3ML IJ SOAJ: 0.3000 mg | Freq: Once | INTRAMUSCULAR | 0 refills | Status: AC

## 2016-07-03 MED ORDER — DIPHENHYDRAMINE HCL 50 MG/ML IJ SOLN
25.0000 mg | Freq: Once | INTRAMUSCULAR | Status: AC
  Administered 2016-07-03: 25 mg via INTRAVENOUS

## 2016-07-03 MED ORDER — SODIUM CHLORIDE 0.9 % IV BOLUS (SEPSIS): 1000.0000 mL | Freq: Once | INTRAVENOUS | Status: DC

## 2016-07-03 MED ORDER — GABAPENTIN 100 MG PO CAPS: 100.0000 mg | ORAL_CAPSULE | Freq: Two times a day (BID) | ORAL | 3 refills | Status: DC

## 2016-07-03 NOTE — ED Provider Notes (Signed)
[facility] Emergency Department Provider Note  ____________________________________________   First MD Initiated Contact with Patient 07/03/16 1548     (approximate)  I have reviewed the triage vital signs and the nursing notes.   HISTORY  Chief Complaint Allergic Reaction   HPI Jeanette Jones is a 61 y.o. female with a history of hypertension as well as lung cancer on chemotherapy who is presenting from the cancer center today after having a reaction to a fusion of carboplatin. Per Dr. Kennith Center, the patient had just received carboplatin when she began feeling diaphoretic, nauseous and was chest pain. The patient was given Solu-Cortef and Benadryl. Transferred to the emergency department for further workup. Is not reporting any difficulty with breathing or a closing sensation of her throat.   Past Medical History:  Diagnosis Date  . Back pain, chronic   . Carcinoma of lung ([place]) 10/12/2014  . Cough   . Depression   . Dyspnea   . Emphysema of lung ([place])   . Heart murmur   . Hep C w/o coma, chronic ([place])   . Hypertension   . Lung cancer ([place])   . Swallowing difficulty     Patient Active Problem List   Diagnosis Date Noted  . Chemotherapy induced neutropenia ([place]) 06/29/2016  . Counseling regarding goals of care 05/22/2016  . Other iron deficiency anemia 02/10/2016  . Primary cancer of bronchus of right lower lobe ([place]) 12/27/2015  . Dysphagia 12/27/2015  . Hep C w/o coma, chronic ([place])   . Back pain, chronic   . Swallowing difficulty     Past Surgical History:  Procedure Laterality Date  . ESOPHAGOGASTRODUODENOSCOPY N/A 06/08/2015   Procedure: ESOPHAGOGASTRODUODENOSCOPY (EGD);  Surgeon: Hulen Luster, MD;  Location: [facility] ENDOSCOPY;  Service: Gastroenterology;  Laterality: N/A;  . HEMORROIDECTOMY    . LUNG BIOPSY    . TUBAL LIGATION      Prior to Admission medications   Medication Sig Start Date End Date Taking? Authorizing Provider    cyclobenzaprine (FLEXERIL) 10 MG tablet Take 10 mg by mouth at bedtime.    Historical Provider, MD  Fluticasone-Salmeterol (ADVAIR DISKUS) 500-50 MCG/DOSE AEPB Inhale 1 puff into the lungs 2 (two) times daily. 05/22/16   Cammie Sickle, MD  gabapentin (NEURONTIN) 100 MG capsule Take 1 capsule (100 mg total) by mouth 2 (two) times daily. 07/03/16   Cammie Sickle, MD  ipratropium-albuterol (DUONEB) 0.5-2.5 (3) MG/3ML SOLN Take 3 mLs by nebulization every 4 (four) hours as needed. 01/24/16   Cammie Sickle, MD  lidocaine-prilocaine (EMLA) cream Apply 1 application topically as needed. To port a cath site- 1 hr prior to port access 04/03/16   Cammie Sickle, MD  ondansetron (ZOFRAN) 8 MG tablet Take 1 tablet (8 mg total) by mouth every 8 (eight) hours as needed for nausea or vomiting (start 3 days; after chemo). Patient not taking: Reported on 07/03/2016 05/22/16   Cammie Sickle, MD  prochlorperazine (COMPAZINE) 10 MG tablet Take 1 tablet (10 mg total) by mouth every 6 (six) hours as needed for nausea or vomiting. Patient not taking: Reported on 07/03/2016 05/22/16   Cammie Sickle, MD  traMADol (ULTRAM) 50 MG tablet Take 2 tablets (100 mg total) by mouth every 8 (eight) hours as needed. 03/06/16   Cammie Sickle, MD    Allergies Patient has no known allergies.  Family History  Problem Relation Age of Onset  . Breast cancer Neg Hx  Social History Social History  Substance Use Topics  . Smoking status: Current Every Day Smoker    Packs/day: 0.25    Types: Cigarettes  . Smokeless tobacco: Never Used  . Alcohol use Yes     Comment: beer on saturday    Review of Systems Constitutional: No fever/chills Eyes: No visual changes. ENT: No sore throat. Cardiovascular: as above Respiratory: Denies shortness of breath. Gastrointestinal: No abdominal pain.   no vomiting.  No diarrhea.  No constipation. Genitourinary: Negative for dysuria. Musculoskeletal:  Negative for back pain. Skin: Negative for rash. Neurological: Negative for headaches, focal weakness or numbness.  10-point ROS otherwise negative.  ____________________________________________   PHYSICAL EXAM:  VITAL SIGNS: ED Triage Vitals  Enc Vitals Group     BP 07/03/16 1537 138/81     Pulse Rate 07/03/16 1537 (!) 117     Resp 07/03/16 1537 (!) 28     Temp --      Temp src --      SpO2 07/03/16 1537 98 %     Weight 07/03/16 1538 120 lb (54.4 kg)     Height 07/03/16 1538 '5\' 3"'$  (1.6 m)     Head Circumference --      Peak Flow --      Pain Score --      Pain Loc --      Pain Edu? --      Excl. in [place]? --     Constitutional: Alert and oriented. Well appearing and in no acute distress. Eyes: Conjunctivae are normal. PERRL. EOMI. Head: Atraumatic. Nose: No congestion/rhinnorhea. Mouth/Throat: Mucous membranes are moist.   Neck: No stridor.   Cardiovascular: tachycardic, regular rhythm. Grossly normal heart sounds.   Respiratory: Normal respiratory effort.  No retractions. Lungs CTAB. Gastrointestinal: Soft and nontender. No distention.  Musculoskeletal: No lower extremity tenderness nor edema.  No joint effusions. Neurologic:  Normal speech and language. No gross focal neurologic deficits are appreciated.  Skin:  Skin is warm, dry and intact. No rash noted. Psychiatric: Mood and affect are normal. Speech and behavior are normal.  ____________________________________________   LABS (all labs ordered are listed, but only abnormal results are displayed)  Labs Reviewed  CBC WITH DIFFERENTIAL/PLATELET - Abnormal; Notable for the following:       Result Value   WBC 15.7 (*)    RBC 3.43 (*)    Hemoglobin 9.9 (*)    HCT 29.5 (*)    RDW 15.6 (*)    Neutro Abs 15.2 (*)    Lymphs Abs 0.3 (*)    All other components within normal limits  BASIC METABOLIC PANEL - Abnormal; Notable for the following:    CO2 19 (*)    Glucose, Bld 159 (*)    Calcium 8.2 (*)    All other  components within normal limits  TROPONIN I   ____________________________________________  EKG  ED ECG REPORT I, Doran Stabler, the attending physician, personally viewed and interpreted this ECG.   Date: 07/03/2016  EKG Time: 1537  Rate: 116  Rhythm: sinus tachycardia  Axis: normal  Intervals:none  ST&T Change: No ST segment elevation or depression. No abnormal T-wave inversion.  ____________________________________________  RADIOLOGY   ____________________________________________   PROCEDURES  Procedure(s) performed:   Procedures  Critical Care performed:   ____________________________________________   INITIAL IMPRESSION / ASSESSMENT AND PLAN / ED COURSE  Pertinent labs & imaging results that were available during my care of the patient were reviewed by me and  considered in my medical decision making (see chart for details).  ----------------------------------------- 7:46 PM on 07/03/2016 -----------------------------------------  Asian resting comfortably at this time. No rash. Denies any difficulty breathing or feeling that her airway is closing. She has been observed for about 4 hours at this point. Her heart rate when I was in the room was 98 bpm. She'll be discharged with 7 days for her anaphylactic reaction. She is requesting a liquid steroid as of her cancer history. She will also be given an EpiPen for use and any further emergent situations. We discussed the appropriate use of the EpiPen and she is understanding willing to comply.      ____________________________________________   FINAL CLINICAL IMPRESSIONS) / ED DIAGNOSES  Anaphylactic reaction.    NEW MEDICATIONS STARTED DURING THIS VISIT:  New Prescriptions   No medications on file     Note:  This document was prepared using Dragon voice recognition software and may include unintentional dictation errors.    Orbie Pyo, MD 07/03/16 [id number]

## 2016-07-03 NOTE — Progress Notes (Signed)
1450 - started Carbo at 1440, patient had to go to the restroom at 1448, on her way to the bathroom became diaphorectic, reported feeling sick on her stomach, having to urinated urgently.  Patient sat in chair, stopped Carbo, increased fluids, checked Vitals (see flowsheet), called Dr. Grayland Ormond and Dr. Rogue Bussing, administered 25 mg of Benadryl and 200 of Solu-Cortef, per Dr. Rogue Bussing.  Patient initially had pulse ox of 97%, complained of chest tightness, pulse ox down to 93% and patient put on 4 liters of O2 with improvement of O2.  Patient complained of nausea.  Dr. Rogue Bussing also ordered 65 of Pepcid.  Patient transported to the ER per Dr. Rogue Bussing.  LJ

## 2016-07-03 NOTE — Progress Notes (Signed)
Patient here for f/u lung cancer. Patient has no medical complaints. Her appetite has increased. 1 lb weight gain since last visit. She denies any NVD.

## 2016-07-03 NOTE — ED Triage Notes (Signed)
Pt was at cancer center having taxal and carboplatin infusion when she began having sob, tachycardia, hypotension and diaphoresis. Pt was given '250mg'$  solumedrol, '25mg'$  benadryl, and '20mg'$  pepcid. Pt reports pain to lower extremities. Pt currently being treated for Lung CA.

## 2016-07-03 NOTE — Assessment & Plan Note (Addendum)
#   Right lung ca- with recurrence [based on imaging]- metastases to right lung ~10-6m s/p 10 cycles of OPDIVO completed on 05/22/16.  CT Jan 2018- increasing size of 2 lung nodules up to 14-15 mm [RUL & RLL] .  Currently on Carboplatin/Paclitaxel, started on 06/12/16.  # proceed with Carbo/Taxol cycle # 2 today; labs-reviewed.   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  reocmmend continued nebs at least 2-3 times day and Advair as prescribed.  Discussed breathing techniques to increase oxygenation.   % Peripheral neuropathy: Start on gabapentin '100mg'$  twice daily.  % Pain: continue taking flexeril as prescribed, follow-up with PCP.  % Insomia: Discussed 4-7-8 breathing technique at bedtime: breathe in to count of 4, hold breath for count of 7, exhale for count of 8; do 3-5 times for letting go of overactive thoughts.  Patient demonstrated understanding.  % Fatigue: Discussed energizing breathing techniques; provided breath ratio chart handout outlining relaxing, balancing, and energizing breathing techniques.  % Smoking cessation: discussed plans to quit, patient will follow-up with PCP for management  # follow up in 3 weeks/ carbo-taxol/ labs / MD.  Labs only in 10 days.  # I reviewed the blood work- with the patient in detail.  Addendum: Patient received Taxol without any infusion reactions. However Patient had a reaction to carboplatinum; blood pressure is dropped to 80s; patient had profuse sweating diaphoresis; nausea with vomiting. Patient received Solu-Cortef ; IV fluid bolus; Benadryl Pepcid. Patient was transported to the emergency room.  I personally interviewed and examined the patient. Agreed with the above plan of care. Patient/family questions were answered. Dr.Brahmanday MD.   # 40 minutes face-to-face with the patient discussing the above plan of care; more than 50% of time spent on prognosis/ natural history; counseling and coordination.

## 2016-07-03 NOTE — ED Notes (Signed)
Helped Pt toilet, changed brief.AS

## 2016-07-03 NOTE — Progress Notes (Signed)
[place] OFFICE PROGRESS NOTE  Patient Care Team: [facility] as PCP - General (General Practice)  Cancer Staging No matching staging information was found for the patient.   Oncology History   Chief Complaint/Diagnosis:   1. 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 Faith Rogue, cardiothoracic surgeon) 2. Started on radiation and concurrent chemotherapy with carboplatinum Taxol from April of 2013 3. Finished radiation and chemotherapy with carboplatinum and Taxol in May of 2013 4. Stage IIIB (T4N3M0) NSCLC who completed a course of EBRT on Oct 07, 2011 with a total dose of 7000cGy.   5. Starting consolidation with 2 cycles of carboplatinum and Abraxane 6.patient is off chemotherapy since November of 2013.  Repeat CT scan shows stable disease(January, 2014) 7  Started been on Taxotere and  RAMICIRUMAB from June 07, 2013 8.chemotherapy with Taxotere  and  RAMUCIRUMAB discontinued in March of 2015 because of hemoptysis 9.Started [facility] April of 2015. 10NIVOLULAMABWas put on hold because of abnormal liver enzymes January of 2016  # AUG 2017- Right lung recurrence [2 nodules - ~1cm; stable lung mass/fibrosis]. START OPDIVO q 2W [small to Bx; s/p rad-onc eval]; opdivo x4- CT OCT 16th- PROGRESSION 2 lung nodules ~65m;   # MOLECULAR TESTING- ? Not enough tissue.      Primary cancer of bronchus of right lower lobe ([place]     INTERVAL HISTORY:  Jeanette HOSIE89y.o.  female pleasant patient with above history of stage IV lung cancer squamous cell/ recurrent. Patient currently here for follow-up and consideration of cycle 2 of Carboplatin/Paclitaxel.  Patient previously evaluated by ENT for epistaxis. Currently much improved.  Patient complains of continued chronic fatigue and shortness of breath. Chronic cough causing chronic chest wall pain  and occasionally leading to dizziness. No hemoptysis. She continues to smoke > 1 pack/day, and is considering quitting.  She reports numbness in her fingers and right foot.  No rashes or itching. Headaches approximately twice a week, lasting approximately 30 minutes, self resolving.  Night sweats every night and occasional chills.  Nausea and vomiting last week, now resolved.  No constipation, diarrhea, hematuria, or hematochezia.  She reports neck, shoulder and back pain, up to 7/10 pain, resolving after taking 2nd flexeril pill at night, being followed by PCP.   REVIEW OF SYSTEMS: Review of Systems  Constitutional: Positive for chills and malaise/fatigue. Negative for fever and weight loss.  HENT: Negative for congestion.   Respiratory: Positive for cough and shortness of breath.   Cardiovascular: Negative for chest pain and leg swelling.  Gastrointestinal: Positive for nausea and vomiting. Negative for abdominal pain, blood in stool, constipation and diarrhea.  Genitourinary: Negative.  Negative for hematuria.  Musculoskeletal: Positive for back pain, joint pain and neck pain.  Neurological: Positive for dizziness, tingling and headaches. Negative for weakness.  Psychiatric/Behavioral: The patient has insomnia. The patient is not nervous/anxious.      PAST MEDICAL HISTORY :  Past Medical History:  Diagnosis Date  . Back pain, chronic   . Carcinoma of lung ([place] 10/12/2014  . Cough   . Depression   . Dyspnea   . Emphysema of lung ([place]   . Heart murmur   . Hep C w/o coma, chronic ([place]   . Hypertension   . Lung cancer ([place]   . Swallowing difficulty     PAST SURGICAL HISTORY :   Past Surgical  History:  Procedure Laterality Date  . ESOPHAGOGASTRODUODENOSCOPY N/A 06/08/2015   Procedure: ESOPHAGOGASTRODUODENOSCOPY (EGD);  Surgeon: Hulen Luster, MD;  Location: [facility] ENDOSCOPY;  Service: Gastroenterology;  Laterality: N/A;  . HEMORROIDECTOMY    . LUNG BIOPSY    . TUBAL LIGATION       FAMILY HISTORY :   Family History  Problem Relation Age of Onset  . Breast cancer Neg Hx     SOCIAL HISTORY:   Social History  Substance Use Topics  . Smoking status: Current Every Day Smoker    Packs/day: 0.25    Types: Cigarettes  . Smokeless tobacco: Never Used  . Alcohol use Yes     Comment: beer on saturday    ALLERGIES:  has No Known Allergies.  MEDICATIONS:  No current facility-administered medications for this visit.    Current Outpatient Prescriptions  Medication Sig Dispense Refill  . cyclobenzaprine (FLEXERIL) 10 MG tablet Take 10 mg by mouth at bedtime.    . Fluticasone-Salmeterol (ADVAIR DISKUS) 500-50 MCG/DOSE AEPB Inhale 1 puff into the lungs 2 (two) times daily. 1 each 3  . ipratropium-albuterol (DUONEB) 0.5-2.5 (3) MG/3ML SOLN Take 3 mLs by nebulization every 4 (four) hours as needed. 360 mL 3  . lidocaine-prilocaine (EMLA) cream Apply 1 application topically as needed. To port a cath site- 1 hr prior to port access 30 g 3  . traMADol (ULTRAM) 50 MG tablet Take 2 tablets (100 mg total) by mouth every 8 (eight) hours as needed. 90 tablet 0  . gabapentin (NEURONTIN) 100 MG capsule Take 1 capsule (100 mg total) by mouth 2 (two) times daily. 90 capsule 3  . ondansetron (ZOFRAN) 8 MG tablet Take 1 tablet (8 mg total) by mouth every 8 (eight) hours as needed for nausea or vomiting (start 3 days; after chemo). (Patient not taking: Reported on 07/03/2016) 40 tablet 1  . prochlorperazine (COMPAZINE) 10 MG tablet Take 1 tablet (10 mg total) by mouth every 6 (six) hours as needed for nausea or vomiting. (Patient not taking: Reported on 07/03/2016) 40 tablet 1   Facility-Administered Medications Ordered in Other Visits  Medication Dose Route Frequency Provider Last Rate Last Dose  . famotidine (PEPCID) IVPB 20 mg premix  20 mg Intravenous Q12H Cammie Sickle, MD   20 mg at 07/03/16 1503  . heparin lock flush 100 unit/mL  500 Units Intravenous Once Cammie Sickle, MD      . ondansetron [facility]) injection 4 mg  4 mg Intravenous Once Orbie Pyo, MD      . pegfilgrastim (NEULASTA ONPRO KIT) injection 6 mg  6 mg Subcutaneous Once Cammie Sickle, MD        PHYSICAL EXAMINATION: ECOG PERFORMANCE STATUS: 0 - Asymptomatic  BP 127/80 (BP Location: Left Arm, Patient Position: Sitting)   Pulse (!) 105   Temp 97.8 F (36.6 C) (Tympanic)   Resp 20   Ht 5' 3"  (1.6 m)   Wt 120 lb 3.2 oz (54.5 kg)   BMI 21.29 kg/m   Filed Weights   07/03/16 0854  Weight: 120 lb 3.2 oz (54.5 kg)    GENERAL: Well-nourished well-developed; Alert, no distress and comfortable.   Alone.  EYES: redness noted; no pallor or icterus OROPHARYNX: no thrush or ulceration; good dentition  NECK: supple, no masses felt LYMPH:  no palpable lymphadenopathy in the cervical, axillary or inguinal regions LUNGS: Decreased air entry to auscultation bilaterally and no wheeze or crackles HEART/CVS: regular rate & rhythm and  no murmurs; No lower extremity edema ABDOMEN:abdomen soft, non-tender and normal bowel sounds Musculoskeletal:no cyanosis of digits and no clubbing  PSYCH: alert & oriented x 3 with fluent speech NEURO: no focal motor/sensory deficits SKIN:  no rashes or significant lesions  LABORATORY DATA:  I have reviewed the data as listed    Component Value Date/Time   NA 133 (L) 07/03/2016 0842   NA 135 08/29/2014 1457   K 3.6 07/03/2016 0842   K 3.7 08/29/2014 1457   CL 106 07/03/2016 0842   CL 105 08/29/2014 1457   CO2 21 (L) 07/03/2016 0842   CO2 24 08/29/2014 1457   GLUCOSE 101 (H) 07/03/2016 0842   GLUCOSE 98 08/29/2014 1457   BUN 16 07/03/2016 0842   BUN 8 08/29/2014 1457   CREATININE 0.58 07/03/2016 0842   CREATININE 0.64 08/29/2014 1457   CALCIUM 8.7 (L) 07/03/2016 0842   CALCIUM 9.0 08/29/2014 1457   PROT 9.0 (H) 07/03/2016 0842   PROT 9.3 (H) 08/29/2014 1457   ALBUMIN 3.5 07/03/2016 0842   ALBUMIN 3.6 08/29/2014 1457   AST  57 (H) 07/03/2016 0842   AST 111 (H) 08/29/2014 1457   ALT 38 07/03/2016 0842   ALT 78 (H) 08/29/2014 1457   ALKPHOS 178 (H) 07/03/2016 0842   ALKPHOS 168 (H) 08/29/2014 1457   BILITOT 0.4 07/03/2016 0842   BILITOT 0.6 08/29/2014 1457   GFRNONAA >60 07/03/2016 0842   GFRNONAA >60 08/29/2014 1457   GFRAA >60 07/03/2016 0842   GFRAA >60 08/29/2014 1457    No results found for: SPEP, UPEP  Lab Results  Component Value Date   WBC 5.9 07/03/2016   NEUTROABS 3.1 07/03/2016   HGB 10.1 (L) 07/03/2016   HCT 29.1 (L) 07/03/2016   MCV 84.3 07/03/2016   PLT 325 07/03/2016      Chemistry      Component Value Date/Time   NA 133 (L) 07/03/2016 0842   NA 135 08/29/2014 1457   K 3.6 07/03/2016 0842   K 3.7 08/29/2014 1457   CL 106 07/03/2016 0842   CL 105 08/29/2014 1457   CO2 21 (L) 07/03/2016 0842   CO2 24 08/29/2014 1457   BUN 16 07/03/2016 0842   BUN 8 08/29/2014 1457   CREATININE 0.58 07/03/2016 0842   CREATININE 0.64 08/29/2014 1457      Component Value Date/Time   CALCIUM 8.7 (L) 07/03/2016 0842   CALCIUM 9.0 08/29/2014 1457   ALKPHOS 178 (H) 07/03/2016 0842   ALKPHOS 168 (H) 08/29/2014 1457   AST 57 (H) 07/03/2016 0842   AST 111 (H) 08/29/2014 1457   ALT 38 07/03/2016 0842   ALT 78 (H) 08/29/2014 1457   BILITOT 0.4 07/03/2016 0842   BILITOT 0.6 08/29/2014 1457      IMPRESSION: 1. Mild Interval increase in size of RIGHT upper lobe pulmonary nodule. 2. Mild interval increase in size of RIGHT lower lobe pulmonary nodule. 3. Stable post radiation change in the RIGHT upper lobe and RIGHT lower lobe.   Electronically Signed   By: Suzy Bouchard M.D.   On: 05/20/2016 12:10  RADIOGRAPHIC STUDIES: I have personally reviewed the radiological images as listed and agreed with the findings in the report. No results found.   ASSESSMENT & PLAN:  Primary cancer of bronchus of right lower lobe ([place]) # Right lung ca- with recurrence [based on imaging]- metastases  to right lung ~10-48m s/p 10 cycles of OPDIVO completed on 05/22/16.  CT Jan 2018- increasing size  of 2 lung nodules up to 14-15 mm [RUL & RLL] .  Currently on Carboplatin/Paclitaxel, started on 06/12/16.  # proceed with Carbo/Taxol cycle # 2 today; labs-reviewed.   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  reocmmend continued nebs at least 2-3 times day and Advair as prescribed.  Discussed breathing techniques to increase oxygenation.   % Peripheral neuropathy: Start on gabapentin 146m twice daily.  % Pain: continue taking flexeril as prescribed, follow-up with PCP.  % Insomia: Discussed 4-7-8 breathing technique at bedtime: breathe in to count of 4, hold breath for count of 7, exhale for count of 8; do 3-5 times for letting go of overactive thoughts.  Patient demonstrated understanding.  % Fatigue: Discussed energizing breathing techniques; provided breath ratio chart handout outlining relaxing, balancing, and energizing breathing techniques.  % Smoking cessation: discussed plans to quit, patient will follow-up with PCP for management  # follow up in 3 weeks/ carbo-taxol/ labs / MD.  Labs only in 10 days.  # I reviewed the blood work- with the patient in detail.  Addendum: Patient received Taxol without any infusion reactions. However Patient had a reaction to carboplatinum; blood pressure is dropped to 80s; patient had profuse sweating diaphoresis; nausea with vomiting. Patient received Solu-Cortef ; IV fluid bolus; Benadryl Pepcid. Patient was transported to the emergency room.  I personally interviewed and examined the patient. Agreed with the above plan of care. Patient/family questions were answered. Dr.Brahmanday MD.   # 40 minutes face-to-face with the patient discussing the above plan of care; more than 50% of time spent on prognosis/ natural history; counseling and coordination.   Orders Placed This Encounter  Procedures  . CBC with Differential    Standing Status:   Future     Standing Expiration Date:   07/03/2017  . Comprehensive metabolic panel    Standing Status:   Future    Standing Expiration Date:   07/03/2017  . CBC with Differential    Standing Status:   Future    Standing Expiration Date:   07/03/2017  . Comprehensive metabolic panel    Standing Status:   Future    Standing Expiration Date:   07/03/2017      GCammie Sickle MD 07/03/2016 5:03 PM

## 2016-07-03 NOTE — Progress Notes (Signed)
Cycle one dose is taxol was '312mg'$ . Dose changed to '300mg'$  for cycle 2 to allow placement of drug into 220m fluid instead of 5067mfluid which the '312mg'$  dose would require. This was done due to fluid shortage.

## 2016-07-03 NOTE — ED Notes (Signed)
Pt talking on phone. Family at bedside.

## 2016-07-13 ENCOUNTER — Telehealth: Payer: Self-pay | Admitting: *Deleted

## 2016-07-13 ENCOUNTER — Inpatient Hospital Stay: Payer: Medicare Other

## 2016-07-13 DIAGNOSIS — C3431 Malignant neoplasm of lower lobe, right bronchus or lung: Secondary | ICD-10-CM | POA: Diagnosis not present

## 2016-07-13 LAB — CBC WITH DIFFERENTIAL/PLATELET
Basophils Absolute: 0 10*3/uL (ref 0–0.1)
Basophils Relative: 1 %
EOS ABS: 0 10*3/uL (ref 0–0.7)
EOS PCT: 1 %
HCT: 27.3 % — ABNORMAL LOW (ref 35.0–47.0)
HEMOGLOBIN: 9.3 g/dL — AB (ref 12.0–16.0)
LYMPHS ABS: 1.2 10*3/uL (ref 1.0–3.6)
LYMPHS PCT: 64 %
MCH: 28.5 pg (ref 26.0–34.0)
MCHC: 34.1 g/dL (ref 32.0–36.0)
MCV: 83.6 fL (ref 80.0–100.0)
Monocytes Absolute: 0.5 10*3/uL (ref 0.2–0.9)
Monocytes Relative: 29 %
NEUTROS PCT: 5 %
Neutro Abs: 0.1 10*3/uL — ABNORMAL LOW (ref 1.4–6.5)
PLATELETS: 318 10*3/uL (ref 150–440)
RBC: 3.27 MIL/uL — AB (ref 3.80–5.20)
RDW: 15.7 % — ABNORMAL HIGH (ref 11.5–14.5)
WBC: 1.8 10*3/uL — AB (ref 3.6–11.0)

## 2016-07-13 LAB — COMPREHENSIVE METABOLIC PANEL
ALK PHOS: 171 U/L — AB (ref 38–126)
ALT: 37 U/L (ref 14–54)
AST: 62 U/L — AB (ref 15–41)
Albumin: 3.4 g/dL — ABNORMAL LOW (ref 3.5–5.0)
Anion gap: 6 (ref 5–15)
BUN: 10 mg/dL (ref 6–20)
CHLORIDE: 104 mmol/L (ref 101–111)
CO2: 22 mmol/L (ref 22–32)
CREATININE: 0.62 mg/dL (ref 0.44–1.00)
Calcium: 8.8 mg/dL — ABNORMAL LOW (ref 8.9–10.3)
GFR calc Af Amer: >60 mL/min (ref >60)
GFR calc non Af Amer: >60 mL/min (ref >60)
Glucose, Bld: 96 mg/dL (ref 65–99)
Potassium: 4.1 mmol/L (ref 3.5–5.1)
SODIUM: 132 mmol/L — AB (ref 135–145)
Total Bilirubin: 0.4 mg/dL (ref 0.3–1.2)
Total Protein: 8.9 g/dL — ABNORMAL HIGH (ref 6.5–8.1)

## 2016-07-13 NOTE — Telephone Encounter (Signed)
Jeanette Jones in cancer ctr lab contacted Jeanette Conn, RN with critical value- 9:50AM today. anc 0.1 with blasts present. Read back process performed with lab tech.  Dr. Rogue Bussing informed 1015am of critical anc. Read back process performed with md.

## 2016-07-14 ENCOUNTER — Other Ambulatory Visit: Payer: Self-pay | Admitting: Internal Medicine

## 2016-07-14 ENCOUNTER — Telehealth: Payer: Self-pay | Admitting: Internal Medicine

## 2016-07-14 NOTE — Telephone Encounter (Signed)
Given recent chemo infusion reaction; hold upcoming chemo; keep labs/ MD appt as it is. Thx

## 2016-07-14 NOTE — Telephone Encounter (Signed)
Notified scheduling to cancel pt's infusion for 07/24/16

## 2016-07-24 ENCOUNTER — Inpatient Hospital Stay: Payer: Medicare Other

## 2016-07-24 ENCOUNTER — Inpatient Hospital Stay: Payer: Medicare Other | Attending: Internal Medicine | Admitting: Internal Medicine

## 2016-07-24 ENCOUNTER — Ambulatory Visit: Payer: Medicare Other

## 2016-07-24 VITALS — BP 99/66 | HR 99 | Temp 96.2°F | Wt 119.0 lb

## 2016-07-24 DIAGNOSIS — F329 Major depressive disorder, single episode, unspecified: Secondary | ICD-10-CM | POA: Insufficient documentation

## 2016-07-24 DIAGNOSIS — J449 Chronic obstructive pulmonary disease, unspecified: Secondary | ICD-10-CM | POA: Diagnosis not present

## 2016-07-24 DIAGNOSIS — B182 Chronic viral hepatitis C: Secondary | ICD-10-CM | POA: Diagnosis not present

## 2016-07-24 DIAGNOSIS — G629 Polyneuropathy, unspecified: Secondary | ICD-10-CM | POA: Diagnosis not present

## 2016-07-24 DIAGNOSIS — J439 Emphysema, unspecified: Secondary | ICD-10-CM | POA: Diagnosis not present

## 2016-07-24 DIAGNOSIS — Z95828 Presence of other vascular implants and grafts: Secondary | ICD-10-CM

## 2016-07-24 DIAGNOSIS — I10 Essential (primary) hypertension: Secondary | ICD-10-CM | POA: Diagnosis not present

## 2016-07-24 DIAGNOSIS — F17210 Nicotine dependence, cigarettes, uncomplicated: Secondary | ICD-10-CM | POA: Insufficient documentation

## 2016-07-24 DIAGNOSIS — Z79899 Other long term (current) drug therapy: Secondary | ICD-10-CM | POA: Diagnosis not present

## 2016-07-24 DIAGNOSIS — C7801 Secondary malignant neoplasm of right lung: Secondary | ICD-10-CM | POA: Diagnosis not present

## 2016-07-24 DIAGNOSIS — C3431 Malignant neoplasm of lower lobe, right bronchus or lung: Secondary | ICD-10-CM | POA: Diagnosis present

## 2016-07-24 DIAGNOSIS — D508 Other iron deficiency anemias: Secondary | ICD-10-CM

## 2016-07-24 LAB — CBC WITH DIFFERENTIAL/PLATELET
BASOS ABS: 0 10*3/uL (ref 0–0.1)
BASOS PCT: 1 %
EOS ABS: 0 10*3/uL (ref 0–0.7)
EOS PCT: 0 %
HCT: 29.4 % — ABNORMAL LOW (ref 35.0–47.0)
Hemoglobin: 10 g/dL — ABNORMAL LOW (ref 12.0–16.0)
Lymphocytes Relative: 38 %
Lymphs Abs: 2.2 10*3/uL (ref 1.0–3.6)
MCH: 27.6 pg (ref 26.0–34.0)
MCHC: 33.8 g/dL (ref 32.0–36.0)
MCV: 81.7 fL (ref 80.0–100.0)
MONO ABS: 0.8 10*3/uL (ref 0.2–0.9)
Monocytes Relative: 14 %
Neutro Abs: 2.7 10*3/uL (ref 1.4–6.5)
Neutrophils Relative %: 47 %
PLATELETS: 502 10*3/uL — AB (ref 150–440)
RBC: 3.6 MIL/uL — ABNORMAL LOW (ref 3.80–5.20)
RDW: 17 % — AB (ref 11.5–14.5)
WBC: 5.7 10*3/uL (ref 3.6–11.0)

## 2016-07-24 LAB — COMPREHENSIVE METABOLIC PANEL
ALBUMIN: 3.3 g/dL — AB (ref 3.5–5.0)
ALT: 26 U/L (ref 14–54)
AST: 46 U/L — AB (ref 15–41)
Alkaline Phosphatase: 136 U/L — ABNORMAL HIGH (ref 38–126)
Anion gap: 6 (ref 5–15)
BUN: 13 mg/dL (ref 6–20)
CHLORIDE: 110 mmol/L (ref 101–111)
CO2: 21 mmol/L — ABNORMAL LOW (ref 22–32)
Calcium: 9 mg/dL (ref 8.9–10.3)
Creatinine, Ser: 0.59 mg/dL (ref 0.44–1.00)
GFR calc Af Amer: >60 mL/min (ref >60)
GLUCOSE: 93 mg/dL (ref 65–99)
POTASSIUM: 3.6 mmol/L (ref 3.5–5.1)
SODIUM: 137 mmol/L (ref 135–145)
Total Bilirubin: 0.3 mg/dL (ref 0.3–1.2)
Total Protein: 8.9 g/dL — ABNORMAL HIGH (ref 6.5–8.1)

## 2016-07-24 MED ORDER — ALTEPLASE 2 MG IJ SOLR: 2.0000 mg | Freq: Once | INTRAMUSCULAR | Status: DC | PRN

## 2016-07-24 MED ORDER — HEPARIN SOD (PORK) LOCK FLUSH 100 UNIT/ML IV SOLN
500.0000 [IU] | Freq: Once | INTRAVENOUS | Status: AC
  Administered 2016-07-24: 500 [IU] via INTRAVENOUS

## 2016-07-24 MED ORDER — SODIUM CHLORIDE 0.9% FLUSH
10.0000 mL | INTRAVENOUS | Status: DC | PRN
  Administered 2016-07-24: 10 mL via INTRAVENOUS
  Filled 2016-07-24: qty 10

## 2016-07-24 MED ORDER — SODIUM CHLORIDE 0.9 % IJ SOLN
10.0000 mL | INTRAMUSCULAR | Status: DC | PRN
  Filled 2016-07-24: qty 10

## 2016-07-24 MED ORDER — HEPARIN SOD (PORK) LOCK FLUSH 100 UNIT/ML IV SOLN: 500.0000 [IU] | Freq: Once | INTRAVENOUS | Status: DC | PRN

## 2016-07-24 MED ORDER — HEPARIN SOD (PORK) LOCK FLUSH 100 UNIT/ML IV SOLN
INTRAVENOUS | Status: AC
  Filled 2016-07-24: qty 5

## 2016-07-24 NOTE — Progress Notes (Signed)
RN Present with md in exam room. Patient voices multiple concerns about her most recent reaction to her chemotherapy. She states that she is concerned about the dosing amt of benadryl after her reaction. "I feel like was given too much benadryl and I was overdosed with benadryl." pt states that her son signed papers at ED discharge "to release her records to a lawyer's office." She states that she "did not authorize this and has now had two lawyers contact me to inquire about my allergic reaction." RN explored pt's concerns and comments.  Pt educated and informed that release of information can only be the signed by the patient.  "Well, I don't know what he signed. I don't know."  After further evaluating her concerns, she states that her "caregiver and close friend personally contacted the lawyers and a consent was not signed for release of information. My friend did that on her own. Then, I told the attorney's office that was not interested in their help."  Pt asked for food pantry items for financial barriers. RN asked pt if she had any food preferences. Pt then became agitated and stated, "never mind, I don't have time to wait for this. I'm leaving."

## 2016-07-24 NOTE — Assessment & Plan Note (Addendum)
#   Right lung ca- with recurrence [based on imaging]- metastases to right lung ~10-89m s/p 10 cycles of OPDIVO completed on 05/22/16.  CT Jan 2018- increasing size of 2 lung nodules up to 14-15 mm [RUL & RLL] .  Currently on Carboplatin/Paclitaxel s/p cycle #2. Clinically no progression of disease; however patient appears to have had an anaphylactic reaction to carboplatin. We'll discontinue carboplatin.  # HOLD chemo today; will get CT scan.   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  reocmmend continued nebs at least 2-3 times day and Advair as prescribed.   # Peripheral neuropathy: Start on gabapentin '100mg'$  twice daily.  # Pain: continue taking flexeril as prescribed, follow-up with PCP.  # Discussed at length with the patient- that our staff would not put her information on social media. Also that the risk of reactions from chemotherapy persists in spite of for best precautions. I will also encouraged her to bring in a paper work that she received from the lawyer for review. Also discussed with nursing supervisor.  #  follow up in 3 weeks/labs / MD/ CT scan prior; No treatment.

## 2016-07-24 NOTE — Progress Notes (Signed)
[place] OFFICE PROGRESS NOTE  Patient Care Team: [place] as PCP - General (General Practice)  Cancer Staging No matching staging information was found for the patient.   Oncology History   Chief Complaint/Diagnosis:   1. 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 Faith Rogue, cardiothoracic surgeon) 2. Started on radiation and concurrent chemotherapy with carboplatinum Taxol from April of 2013 3. Finished radiation and chemotherapy with carboplatinum and Taxol in May of 2013 4. Stage IIIB (T4N3M0) NSCLC who completed a course of EBRT on Oct 07, 2011 with a total dose of 7000cGy.   5. Starting consolidation with 2 cycles of carboplatinum and Abraxane 6.patient is off chemotherapy since November of 2013.  Repeat CT scan shows stable disease(January, 2014) 7  Started been on Taxotere and  RAMICIRUMAB from June 07, 2013 8.chemotherapy with Taxotere  and  RAMUCIRUMAB discontinued in March of 2015 because of hemoptysis 9.Started [facility] April of 2015. 10NIVOLULAMABWas put on hold because of abnormal liver enzymes January of 2016  # AUG 2017- Right lung recurrence [2 nodules - ~1cm; stable lung mass/fibrosis]. START OPDIVO q 2W [small to Bx; s/p rad-onc eval]; opdivo x4- CT OCT 16th- PROGRESSION 2 lung nodules ~56m;   # MOLECULAR TESTING- ? Not enough tissue.      Primary cancer of bronchus of right lower lobe (HCC)     INTERVAL HISTORY:  Jeanette DOSCH89y.o.  female pleasant patient above history of stage IV lung cancer squamous cell/ recurrent- Patient s/p cycle of carbo/taxol Cycle #2. However, cycle #2 was interrupted because of an anaphylactic reaction to carboplatin.  Patient had received Taxol without any major problems. However as soon as the carboplatin started infusing- she had "anaphylactic shock"; she was treated appropriately  with steroids; Benadryl and Pepcid. She was transferred to the emergency room.  Patient continues to complain of chronic cough; "blood when coughing"; she unfortunately does not use her nebulizer. Unfortunately continues to smoke.   Interestingly patient also states that "attorney office " had contacted her regarding "her being overdosed with medication ". She also states" that her care has been mentioned on social media by the nurses".    REVIEW OF SYSTEMS:  A complete 10 point review of system is done which is negative except mentioned above/history of present illness.   PAST MEDICAL HISTORY :  Past Medical History:  Diagnosis Date  . Back pain, chronic   . [place] ([facility] [place]  . Cough   . Depression   . Dyspnea   . [place] of lung ([place]   . Heart murmur   . Hep C w/o coma, chronic ([facility]   . Hypertension   . Lung cancer ([place]   . Swallowing difficulty     PAST SURGICAL HISTORY :   Past Surgical History:  Procedure Laterality Date  . ESOPHAGOGASTRODUODENOSCOPY N/A 06/08/2015   Procedure: ESOPHAGOGASTRODUODENOSCOPY (EGD);  Surgeon: PHulen Luster MD;  [facility]  Service: Gastroenterology;  Laterality: N/A;  . HEMORROIDECTOMY    . LUNG BIOPSY    . TUBAL LIGATION      FAMILY HISTORY :   Family History  Problem Relation Age of Onset  . Breast cancer Neg Hx     SOCIAL HISTORY:   Social History  Substance Use Topics  . Smoking status: Current Every Day Smoker    Packs/day: 0.25    Types:  Cigarettes  . Smokeless tobacco: Never Used  . Alcohol use Yes     Comment: beer on saturday    ALLERGIES:  has No Known Allergies.  MEDICATIONS:  Current Outpatient Prescriptions  Medication Sig Dispense Refill  . cyclobenzaprine (FLEXERIL) 10 MG tablet Take 10 mg by mouth at bedtime.    . Fluticasone-Salmeterol (ADVAIR DISKUS) 500-50 MCG/DOSE AEPB Inhale 1 puff into the lungs 2 (two) times daily. 1 each 3  . gabapentin (NEURONTIN) 100 MG capsule Take 1  capsule (100 mg total) by mouth 2 (two) times daily. 90 capsule 3  . ipratropium-albuterol (DUONEB) 0.5-2.5 (3) MG/3ML SOLN Take 3 mLs by nebulization every 4 (four) hours as needed. 360 mL 3  . lidocaine-prilocaine (EMLA) cream Apply 1 application topically as needed. To port a cath site- 1 hr prior to port access 30 g 3  . ondansetron (ZOFRAN) 8 MG tablet Take 1 tablet (8 mg total) by mouth every 8 (eight) hours as needed for nausea or vomiting (start 3 days; after chemo). 40 tablet 1  . prochlorperazine (COMPAZINE) 10 MG tablet Take 1 tablet (10 mg total) by mouth every 6 (six) hours as needed for nausea or vomiting. 40 tablet 1  . traMADol (ULTRAM) 50 MG tablet Take 2 tablets (100 mg total) by mouth every 8 (eight) hours as needed. 90 tablet 0   No current facility-administered medications for this visit.    Facility-Administered Medications Ordered in Other Visits  Medication Dose Route Frequency Provider Last Rate Last Dose  . alteplase (CATHFLO ACTIVASE) injection 2 mg  2 mg Intracatheter Once PRN Cammie Sickle, MD      . heparin lock flush 100 unit/mL  500 Units Intracatheter Once PRN Cammie Sickle, MD      . sodium chloride 0.9 % injection 10 mL  10 mL Intracatheter PRN Cammie Sickle, MD      . sodium chloride flush (NS) 0.9 % injection 10 mL  10 mL Intravenous PRN Cammie Sickle, MD   10 mL at 07/24/16 0845    PHYSICAL EXAMINATION: ECOG PERFORMANCE STATUS: 0 - Asymptomatic  BP 99/66 (BP Location: Left Arm, Patient Position: Sitting)   Pulse 99   Temp (!) 96.2 F (35.7 C) (Tympanic)   Wt 119 lb (54 kg)   BMI 21.08 kg/m   Filed Weights   07/24/16 0916  Weight: 119 lb (54 kg)    GENERAL: Well-nourished well-developed; Alert, no distress and comfortable.   Alone.  EYES: no pallor or icterus OROPHARYNX: no thrush or ulceration; good dentition  NECK: supple, no masses felt LYMPH:  no palpable lymphadenopathy in the cervical, axillary or inguinal  regions LUNGS: Decreased air entry to auscultation bilaterally and  No wheeze or crackles HEART/CVS: regular rate & rhythm and no murmurs; No lower extremity edema ABDOMEN:abdomen soft, non-tender and normal bowel sounds Musculoskeletal:no cyanosis of digits and no clubbing  PSYCH: alert & oriented x 3 with fluent speech NEURO: no focal motor/sensory deficits SKIN:  no rashes or significant lesions  LABORATORY DATA:  I have reviewed the data as listed    Component Value Date/Time   NA 137 07/24/2016 0845   NA 135 08/29/2014 1457   K 3.6 07/24/2016 0845   K 3.7 08/29/2014 1457   CL 110 07/24/2016 0845   CL 105 08/29/2014 1457   CO2 21 (L) 07/24/2016 0845   CO2 24 08/29/2014 1457   GLUCOSE 93 07/24/2016 0845   GLUCOSE 98 08/29/2014 1457   BUN  13 07/24/2016 0845   BUN 8 08/29/2014 1457   CREATININE 0.59 07/24/2016 0845   CREATININE 0.64 08/29/2014 1457   CALCIUM 9.0 07/24/2016 0845   CALCIUM 9.0 08/29/2014 1457   PROT 8.9 (H) 07/24/2016 0845   PROT 9.3 (H) 08/29/2014 1457   ALBUMIN 3.3 (L) 07/24/2016 0845   ALBUMIN 3.6 08/29/2014 1457   AST 46 (H) 07/24/2016 0845   AST 111 (H) 08/29/2014 1457   ALT 26 07/24/2016 0845   ALT 78 (H) 08/29/2014 1457   ALKPHOS 136 (H) 07/24/2016 0845   ALKPHOS 168 (H) 08/29/2014 1457   BILITOT 0.3 07/24/2016 0845   BILITOT 0.6 08/29/2014 1457   GFRNONAA >60 07/24/2016 0845   GFRNONAA >60 08/29/2014 1457   GFRAA >60 07/24/2016 0845   GFRAA >60 08/29/2014 1457    No results found for: SPEP, UPEP  Lab Results  Component Value Date   WBC 5.7 07/24/2016   NEUTROABS 2.7 07/24/2016   HGB 10.0 (L) 07/24/2016   HCT 29.4 (L) 07/24/2016   MCV 81.7 07/24/2016   PLT 502 (H) 07/24/2016      Chemistry      Component Value Date/Time   NA 137 07/24/2016 0845   NA 135 08/29/2014 1457   K 3.6 07/24/2016 0845   K 3.7 08/29/2014 1457   CL 110 07/24/2016 0845   CL 105 08/29/2014 1457   CO2 21 (L) 07/24/2016 0845   CO2 24 08/29/2014 1457   BUN  13 07/24/2016 0845   BUN 8 08/29/2014 1457   CREATININE 0.59 07/24/2016 0845   CREATININE 0.64 08/29/2014 1457      Component Value Date/Time   CALCIUM 9.0 07/24/2016 0845   CALCIUM 9.0 08/29/2014 1457   ALKPHOS 136 (H) 07/24/2016 0845   ALKPHOS 168 (H) 08/29/2014 1457   AST 46 (H) 07/24/2016 0845   AST 111 (H) 08/29/2014 1457   ALT 26 07/24/2016 0845   ALT 78 (H) 08/29/2014 1457   BILITOT 0.3 07/24/2016 0845   BILITOT 0.6 08/29/2014 1457      IMPRESSION: 1. Mild Interval increase in size of RIGHT upper lobe pulmonary nodule. 2. Mild interval increase in size of RIGHT lower lobe pulmonary nodule. 3. Stable post radiation change in the RIGHT upper lobe and RIGHT lower lobe.   Electronically Signed   By: Suzy Bouchard M.D.   On: 05/20/2016 12:10  RADIOGRAPHIC STUDIES: I have personally reviewed the radiological images as listed and agreed with the findings in the report. No results found.   ASSESSMENT & PLAN:  Primary cancer of bronchus of right lower lobe ([place]) # Right lung ca- with recurrence [based on imaging]- metastases to right lung ~10-49m s/p 10 cycles of OPDIVO completed on 05/22/16.  CT Jan 2018- increasing size of 2 lung nodules up to 14-15 mm [RUL & RLL] .  Currently on Carboplatin/Paclitaxel s/p cycle #2. Clinically no progression of disease; however patient appears to have had an anaphylactic reaction to carboplatin.  # proceed with single agent Taxol cycle # 3 today; Labs today reviewed;  acceptable for treatment today.  We will get CT scan after this cycle.  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  reocmmend continued nebs at least 2-3 times day and Advair as prescribed.   # Peripheral neuropathy: Start on gabapentin '100mg'$  twice daily.  # Pain: continue taking flexeril as prescribed, follow-up with PCP.  # follow up in 3 weeks/labs / MD/ CT scan prior; No treatment.    Orders Placed This Encounter  Procedures  .  CT CHEST W CONTRAST    Standing Status:    Future    Standing Expiration Date:   09/23/2017    Order Specific Question:   Reason for Exam (SYMPTOM  OR DIAGNOSIS REQUIRED)    Answer:   lung cancer    Order Specific Question:   Is the patient pregnant?    Answer:   No    Order Specific Question:   Preferred imaging location?    Answer:   [facility]  . CBC with Differential    Standing Status:   Future    Standing Expiration Date:   07/24/2017  . Comprehensive metabolic panel    Standing Status:   Future    Standing Expiration Date:   07/24/2017      Cammie Sickle, MD 07/24/2016 11:08 AM

## 2016-07-28 ENCOUNTER — Ambulatory Visit
Admission: RE | Admit: 2016-07-28 | Discharge: 2016-07-28 | Disposition: A | Discharge status: Home / Self Care | Payer: Medicare Other | Source: Ambulatory Visit | Attending: Physician Assistant | Admitting: Physician Assistant

## 2016-07-28 ENCOUNTER — Inpatient Hospital Stay: Payer: Medicare Other

## 2016-07-28 DIAGNOSIS — Z1231 Encounter for screening mammogram for malignant neoplasm of breast: Secondary | ICD-10-CM | POA: Diagnosis present

## 2016-08-13 ENCOUNTER — Ambulatory Visit
Admission: RE | Admit: 2016-08-13 | Discharge: 2016-08-13 | Disposition: A | Discharge status: Home / Self Care | Payer: Medicare Other | Source: Ambulatory Visit | Attending: Internal Medicine | Admitting: Internal Medicine

## 2016-08-13 ENCOUNTER — Inpatient Hospital Stay: Payer: Medicare Other

## 2016-08-13 DIAGNOSIS — R918 Other nonspecific abnormal finding of lung field: Secondary | ICD-10-CM | POA: Insufficient documentation

## 2016-08-13 DIAGNOSIS — Y842 Radiological procedure and radiotherapy as the cause of abnormal reaction of the patient, or of later complication, without mention of misadventure at the time of the procedure: Secondary | ICD-10-CM | POA: Diagnosis not present

## 2016-08-13 DIAGNOSIS — C3431 Malignant neoplasm of lower lobe, right bronchus or lung: Secondary | ICD-10-CM | POA: Insufficient documentation

## 2016-08-13 MED ORDER — IOPAMIDOL (ISOVUE-300) INJECTION 61%
75.0000 mL | Freq: Once | INTRAVENOUS | Status: AC | PRN
  Administered 2016-08-13: 75 mL via INTRAVENOUS

## 2016-08-17 ENCOUNTER — Inpatient Hospital Stay: Payer: Medicare Other

## 2016-08-17 ENCOUNTER — Inpatient Hospital Stay: Payer: Medicare Other | Attending: Internal Medicine | Admitting: Internal Medicine

## 2016-08-17 VITALS — BP 138/83 | HR 85 | Temp 97.6°F | Ht 63.0 in | Wt 116.8 lb

## 2016-08-17 DIAGNOSIS — B182 Chronic viral hepatitis C: Secondary | ICD-10-CM | POA: Diagnosis not present

## 2016-08-17 DIAGNOSIS — I10 Essential (primary) hypertension: Secondary | ICD-10-CM | POA: Insufficient documentation

## 2016-08-17 DIAGNOSIS — J449 Chronic obstructive pulmonary disease, unspecified: Secondary | ICD-10-CM | POA: Diagnosis not present

## 2016-08-17 DIAGNOSIS — Z9221 Personal history of antineoplastic chemotherapy: Secondary | ICD-10-CM | POA: Diagnosis not present

## 2016-08-17 DIAGNOSIS — Z79899 Other long term (current) drug therapy: Secondary | ICD-10-CM | POA: Insufficient documentation

## 2016-08-17 DIAGNOSIS — C3431 Malignant neoplasm of lower lobe, right bronchus or lung: Secondary | ICD-10-CM

## 2016-08-17 DIAGNOSIS — F329 Major depressive disorder, single episode, unspecified: Secondary | ICD-10-CM | POA: Insufficient documentation

## 2016-08-17 DIAGNOSIS — F17210 Nicotine dependence, cigarettes, uncomplicated: Secondary | ICD-10-CM | POA: Diagnosis not present

## 2016-08-17 DIAGNOSIS — C7801 Secondary malignant neoplasm of right lung: Secondary | ICD-10-CM | POA: Diagnosis not present

## 2016-08-17 DIAGNOSIS — G629 Polyneuropathy, unspecified: Secondary | ICD-10-CM | POA: Diagnosis not present

## 2016-08-17 DIAGNOSIS — Z923 Personal history of irradiation: Secondary | ICD-10-CM | POA: Insufficient documentation

## 2016-08-17 LAB — COMPREHENSIVE METABOLIC PANEL
ALT: 41 U/L (ref 14–54)
AST: 77 U/L — ABNORMAL HIGH (ref 15–41)
Albumin: 3.6 g/dL (ref 3.5–5.0)
Alkaline Phosphatase: 153 U/L — ABNORMAL HIGH (ref 38–126)
Anion gap: 5 (ref 5–15)
BUN: 10 mg/dL (ref 6–20)
CO2: 23 mmol/L (ref 22–32)
Calcium: 9.1 mg/dL (ref 8.9–10.3)
Chloride: 109 mmol/L (ref 101–111)
Creatinine, Ser: 0.7 mg/dL (ref 0.44–1.00)
Glucose, Bld: 98 mg/dL (ref 65–99)
POTASSIUM: 4.2 mmol/L (ref 3.5–5.1)
SODIUM: 137 mmol/L (ref 135–145)
Total Bilirubin: 0.4 mg/dL (ref 0.3–1.2)
Total Protein: 8.9 g/dL — ABNORMAL HIGH (ref 6.5–8.1)

## 2016-08-17 LAB — CBC WITH DIFFERENTIAL/PLATELET
BASOS ABS: 0.1 10*3/uL (ref 0–0.1)
Basophils Relative: 1 %
EOS ABS: 0.1 10*3/uL (ref 0–0.7)
EOS PCT: 3 %
HCT: 31.3 % — ABNORMAL LOW (ref 35.0–47.0)
Hemoglobin: 10.4 g/dL — ABNORMAL LOW (ref 12.0–16.0)
LYMPHS ABS: 1.2 10*3/uL (ref 1.0–3.6)
LYMPHS PCT: 29 %
MCH: 26.8 pg (ref 26.0–34.0)
MCHC: 33.1 g/dL (ref 32.0–36.0)
MCV: 81.1 fL (ref 80.0–100.0)
MONO ABS: 0.7 10*3/uL (ref 0.2–0.9)
Monocytes Relative: 16 %
Neutro Abs: 2 10*3/uL (ref 1.4–6.5)
Neutrophils Relative %: 51 %
PLATELETS: 359 10*3/uL (ref 150–440)
RBC: 3.86 MIL/uL (ref 3.80–5.20)
RDW: 18.9 % — AB (ref 11.5–14.5)
WBC: 4.1 10*3/uL (ref 3.6–11.0)

## 2016-08-17 NOTE — Assessment & Plan Note (Addendum)
#   Right lung ca- with recurrence [based on imaging]- metastases to right lung ~10-71m  CT Jan 2018- increasing size of 2 lung nodules up to 14-15 mm [RUL & RLL].  s/p 2 cycles of carbo-taxol; PR- improving RUL/RLL nodules- mass.   # will give chemo break for now. Will repeat scans in 3 months. If progression noted in future- docetaxel/ rami would be an option [given carbo- anaphylaxis]  # COPD/bronchitis-STABLE. Recommend continued nebs at least 2-3 times day and Advair as prescribed.   # Peripheral neuropathy: continue gabapentin '100mg'$  twice daily.  #  follow up in 6 weeks/labs /MD. No chemo/ port flush.   # I reviewed the blood work- with the patient in detail; also reviewed the imaging independently [as summarized above]; and with the patient in detail.

## 2016-08-17 NOTE — Progress Notes (Signed)
Patient here for follow up. No changes since last appointment.  

## 2016-08-17 NOTE — Progress Notes (Signed)
[place] OFFICE PROGRESS NOTE  Patient Care Team: [place] as PCP - General (General Practice)  Cancer Staging No matching staging information was found for the patient.   Oncology History   Chief Complaint/Diagnosis:   1. 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 Faith Rogue, cardiothoracic surgeon) 2. Started on radiation and concurrent chemotherapy with carboplatinum Taxol from April of 2013 3. Finished radiation and chemotherapy with carboplatinum and Taxol in May of 2013 4. Stage IIIB (T4N3M0) NSCLC who completed a course of EBRT on Oct 07, 2011 with a total dose of 7000cGy.   5. Starting consolidation with 2 cycles of carboplatinum and Abraxane 6.patient is off chemotherapy since November of 2013.  Repeat CT scan shows stable disease(January, 2014) 7  Started been on Taxotere and  RAMICIRUMAB from June 07, 2013 8.chemotherapy with Taxotere  and  RAMUCIRUMAB discontinued in March of 2015 because of hemoptysis 9.Started [facility] April of 2015. 10NIVOLULAMABWas put on hold because of abnormal liver enzymes January of 2016  # AUG 2017- Right lung recurrence [2 nodules - ~1cm; stable lung mass/fibrosis]. START OPDIVO q 2W [small to Bx; s/p rad-onc eval]; opdivo x4- CT OCT 16th- PROGRESSION 2 lung nodules ~60m;   # MOLECULAR TESTING- ? Not enough tissue.      Primary cancer of bronchus of right lower lobe (HCC)     INTERVAL HISTORY:  Jeanette ARBOGAST89y.o.  female pleasant patient above history of stage IV lung cancer squamous cell/ recurrent- Patient s/p cycle of carbo/taxol Cycle #2. However, cycle #2 was interrupted because of an anaphylactic reaction to carboplatin; Patient is here to review the results of her restaging CAT scan.  Overall she feels okay. Continues to chronic cough but no blood. She unfortunately does not use her  nebulizer. Unfortunately continues to smoke. She denies any weight loss. Denies any headaches. No nausea no vomiting. Her appetite improved.    REVIEW OF SYSTEMS:  A complete 10 point review of system is done which is negative except mentioned above/history of present illness.   PAST MEDICAL HISTORY :  Past Medical History:  Diagnosis Date  . Back pain, chronic   . Carcinoma of lung ([place] 07[place]  . Cough   . Depression   . Dyspnea   . Emphysema of lung ([place]   . Heart murmur   . Hep C w/o coma, chronic ([facility]   . Hypertension   . Lung cancer ([facility]   . Swallowing difficulty     PAST SURGICAL HISTORY :   Past Surgical History:  Procedure Laterality Date  . ESOPHAGOGASTRODUODENOSCOPY N/A 06/08/2015   Procedure: ESOPHAGOGASTRODUODENOSCOPY (EGD);  Surgeon: PHulen Luster MD;  [facility];  Service: Gastroenterology;  Laterality: N/A;  . HEMORROIDECTOMY    . LUNG BIOPSY    . TUBAL LIGATION      FAMILY HISTORY :   Family History  Problem Relation Age of Onset  . Breast cancer Neg Hx     SOCIAL HISTORY:   Social History  Substance Use Topics  . Smoking status: Current Every Day Smoker    Packs/day: 0.25    Types: Cigarettes  . Smokeless tobacco: Never Used  . Alcohol use Yes     Comment: beer on saturday    ALLERGIES:  is allergic to carboplatin.  MEDICATIONS:  Current Outpatient Prescriptions  Medication Sig Dispense Refill  . cyclobenzaprine (FLEXERIL)  10 MG tablet Take 10 mg by mouth at bedtime.    . Fluticasone-Salmeterol (ADVAIR DISKUS) 500-50 MCG/DOSE AEPB Inhale 1 puff into the lungs 2 (two) times daily. 1 each 3  . gabapentin (NEURONTIN) 100 MG capsule Take 1 capsule (100 mg total) by mouth 2 (two) times daily. 90 capsule 3  . ipratropium-albuterol (DUONEB) 0.5-2.5 (3) MG/3ML SOLN Take 3 mLs by nebulization every 4 (four) hours as needed. 360 mL 3  . lidocaine-prilocaine (EMLA) cream Apply 1 application topically as needed. To port a cath site- 1 hr  prior to port access 30 g 3  . ondansetron (ZOFRAN) 8 MG tablet Take 1 tablet (8 mg total) by mouth every 8 (eight) hours as needed for nausea or vomiting (start 3 days; after chemo). 40 tablet 1  . prochlorperazine (COMPAZINE) 10 MG tablet Take 1 tablet (10 mg total) by mouth every 6 (six) hours as needed for nausea or vomiting. 40 tablet 1  . traMADol (ULTRAM) 50 MG tablet Take 2 tablets (100 mg total) by mouth every 8 (eight) hours as needed. 90 tablet 0   No current facility-administered medications for this visit.     PHYSICAL EXAMINATION: ECOG PERFORMANCE STATUS: 0 - Asymptomatic  BP 138/83 (BP Location: Left Arm, Patient Position: Sitting)   Pulse 85   Temp 97.6 F (36.4 C) (Tympanic)   Ht '5\' 3"'$  (1.6 m)   Wt 116 lb 12.8 oz (53 kg)   BMI 20.69 kg/m   Filed Weights   08/17/16 1354  Weight: 116 lb 12.8 oz (53 kg)    GENERAL: Well-nourished well-developed; Alert, no distress and comfortable.   Alone.  EYES: no pallor or icterus OROPHARYNX: no thrush or ulceration; good dentition  NECK: supple, no masses felt LYMPH:  no palpable lymphadenopathy in the cervical, axillary or inguinal regions LUNGS: Decreased air entry to auscultation bilaterally and  No wheeze or crackles HEART/CVS: regular rate & rhythm and no murmurs; No lower extremity edema ABDOMEN:abdomen soft, non-tender and normal bowel sounds Musculoskeletal:no cyanosis of digits and no clubbing  PSYCH: alert & oriented x 3 with fluent speech NEURO: no focal motor/sensory deficits SKIN:  no rashes or significant lesions  LABORATORY DATA:  I have reviewed the data as listed    Component Value Date/Time   NA 137 08/17/2016 1340   NA 135 08/29/2014 1457   K 4.2 08/17/2016 1340   K 3.7 08/29/2014 1457   CL 109 08/17/2016 1340   CL 105 08/29/2014 1457   CO2 23 08/17/2016 1340   CO2 24 08/29/2014 1457   GLUCOSE 98 08/17/2016 1340   GLUCOSE 98 08/29/2014 1457   BUN 10 08/17/2016 1340   BUN 8 08/29/2014 1457    CREATININE 0.70 08/17/2016 1340   CREATININE 0.64 08/29/2014 1457   CALCIUM 9.1 08/17/2016 1340   CALCIUM 9.0 08/29/2014 1457   PROT 8.9 (H) 08/17/2016 1340   PROT 9.3 (H) 08/29/2014 1457   ALBUMIN 3.6 08/17/2016 1340   ALBUMIN 3.6 08/29/2014 1457   AST 77 (H) 08/17/2016 1340   AST 111 (H) 08/29/2014 1457   ALT 41 08/17/2016 1340   ALT 78 (H) 08/29/2014 1457   ALKPHOS 153 (H) 08/17/2016 1340   ALKPHOS 168 (H) 08/29/2014 1457   BILITOT 0.4 08/17/2016 1340   BILITOT 0.6 08/29/2014 1457   GFRNONAA >60 08/17/2016 1340   GFRNONAA >60 08/29/2014 1457   GFRAA >60 08/17/2016 1340   GFRAA >60 08/29/2014 1457    No results found for: SPEP, UPEP  Lab Results  Component Value Date   WBC 4.1 08/17/2016   NEUTROABS 2.0 08/17/2016   HGB 10.4 (L) 08/17/2016   HCT 31.3 (L) 08/17/2016   MCV 81.1 08/17/2016   PLT 359 08/17/2016      Chemistry      Component Value Date/Time   NA 137 08/17/2016 1340   NA 135 08/29/2014 1457   K 4.2 08/17/2016 1340   K 3.7 08/29/2014 1457   CL 109 08/17/2016 1340   CL 105 08/29/2014 1457   CO2 23 08/17/2016 1340   CO2 24 08/29/2014 1457   BUN 10 08/17/2016 1340   BUN 8 08/29/2014 1457   CREATININE 0.70 08/17/2016 1340   CREATININE 0.64 08/29/2014 1457      Component Value Date/Time   CALCIUM 9.1 08/17/2016 1340   CALCIUM 9.0 08/29/2014 1457   ALKPHOS 153 (H) 08/17/2016 1340   ALKPHOS 168 (H) 08/29/2014 1457   AST 77 (H) 08/17/2016 1340   AST 111 (H) 08/29/2014 1457   ALT 41 08/17/2016 1340   ALT 78 (H) 08/29/2014 1457   BILITOT 0.4 08/17/2016 1340   BILITOT 0.6 08/29/2014 1457      IMPRESSION: 1. Mild Interval increase in size of RIGHT upper lobe pulmonary nodule. 2. Mild interval increase in size of RIGHT lower lobe pulmonary nodule. 3. Stable post radiation change in the RIGHT upper lobe and RIGHT lower lobe.   Electronically Signed   By: Suzy Bouchard M.D.   On: 05/20/2016  12:10  ------------------------------------------------------------------------------------------    IMPRESSION: 1. Interval decrease in size of right upper lobe pulmonary nodule. 2. Previously described right lower lobe pulmonary nodule has decreased in the interval. 3. Irregular lesion posterior right lower lobe described previously as post radiation change is stable. 4. Right upper lobe fibrosis similar to prior.   Electronically Signed   By: Misty Stanley M.D.   On: 08/13/2016 14:25 RADIOGRAPHIC STUDIES: I have personally reviewed the radiological images as listed and agreed with the findings in the report. No results found.   ASSESSMENT & PLAN:  Primary cancer of bronchus of right lower lobe ([place]) # Right lung ca- with recurrence [based on imaging]- metastases to right lung ~10-38m  CT Jan 2018- increasing size of 2 lung nodules up to 14-15 mm [RUL & RLL].  s/p 2 cycles of carbo-taxol; PR- improving RUL/RLL nodules- mass.   # will give chemo break for now. Will repeat scans in 3 months. If progression noted in future- docetaxel/ rami would be an option [given carbo- anaphylaxis]  # COPD/bronchitis-STABLE. Recommend continued nebs at least 2-3 times day and Advair as prescribed.   # Peripheral neuropathy: continue gabapentin '100mg'$  twice daily.  #  follow up in 6 weeks/labs /MD. No chemo/ port flush.   # I reviewed the blood work- with the patient in detail; also reviewed the imaging independently [as summarized above]; and with the patient in detail.    Orders Placed This Encounter  Procedures  . CBC with Differential    Standing Status:   Future    Standing Expiration Date:   08/17/2017  . Comprehensive metabolic panel    Standing Status:   Future    Standing Expiration Date:   08/17/2017      GCammie Sickle MD 08/18/2016 1:19 PM

## 2016-09-28 ENCOUNTER — Inpatient Hospital Stay (HOSPITAL_BASED_OUTPATIENT_CLINIC_OR_DEPARTMENT_OTHER): Payer: Medicare Other | Admitting: Internal Medicine

## 2016-09-28 ENCOUNTER — Inpatient Hospital Stay: Payer: Medicare Other | Attending: Internal Medicine

## 2016-09-28 ENCOUNTER — Inpatient Hospital Stay: Payer: Medicare Other

## 2016-09-28 ENCOUNTER — Other Ambulatory Visit: Payer: Self-pay | Admitting: *Deleted

## 2016-09-28 VITALS — BP 160/97 | HR 98 | Temp 98.0°F | Resp 18 | Ht 63.0 in | Wt 117.0 lb

## 2016-09-28 DIAGNOSIS — G629 Polyneuropathy, unspecified: Secondary | ICD-10-CM | POA: Insufficient documentation

## 2016-09-28 DIAGNOSIS — T451X5A Adverse effect of antineoplastic and immunosuppressive drugs, initial encounter: Secondary | ICD-10-CM

## 2016-09-28 DIAGNOSIS — C7801 Secondary malignant neoplasm of right lung: Secondary | ICD-10-CM

## 2016-09-28 DIAGNOSIS — C3431 Malignant neoplasm of lower lobe, right bronchus or lung: Secondary | ICD-10-CM | POA: Diagnosis present

## 2016-09-28 DIAGNOSIS — J449 Chronic obstructive pulmonary disease, unspecified: Secondary | ICD-10-CM

## 2016-09-28 DIAGNOSIS — Z923 Personal history of irradiation: Secondary | ICD-10-CM | POA: Insufficient documentation

## 2016-09-28 DIAGNOSIS — D508 Other iron deficiency anemias: Secondary | ICD-10-CM

## 2016-09-28 DIAGNOSIS — D649 Anemia, unspecified: Secondary | ICD-10-CM

## 2016-09-28 DIAGNOSIS — Z95828 Presence of other vascular implants and grafts: Secondary | ICD-10-CM

## 2016-09-28 DIAGNOSIS — D701 Agranulocytosis secondary to cancer chemotherapy: Secondary | ICD-10-CM

## 2016-09-28 DIAGNOSIS — Z9221 Personal history of antineoplastic chemotherapy: Secondary | ICD-10-CM | POA: Diagnosis not present

## 2016-09-28 DIAGNOSIS — I10 Essential (primary) hypertension: Secondary | ICD-10-CM | POA: Insufficient documentation

## 2016-09-28 DIAGNOSIS — F17210 Nicotine dependence, cigarettes, uncomplicated: Secondary | ICD-10-CM | POA: Insufficient documentation

## 2016-09-28 DIAGNOSIS — Z79899 Other long term (current) drug therapy: Secondary | ICD-10-CM | POA: Diagnosis not present

## 2016-09-28 DIAGNOSIS — R748 Abnormal levels of other serum enzymes: Secondary | ICD-10-CM | POA: Diagnosis not present

## 2016-09-28 DIAGNOSIS — Z85118 Personal history of other malignant neoplasm of bronchus and lung: Secondary | ICD-10-CM | POA: Diagnosis not present

## 2016-09-28 LAB — COMPREHENSIVE METABOLIC PANEL
ALK PHOS: 175 U/L — AB (ref 38–126)
ALT: 33 U/L (ref 14–54)
AST: 60 U/L — ABNORMAL HIGH (ref 15–41)
Albumin: 3.5 g/dL (ref 3.5–5.0)
Anion gap: 7 (ref 5–15)
BILIRUBIN TOTAL: 0.5 mg/dL (ref 0.3–1.2)
BUN: 17 mg/dL (ref 6–20)
CALCIUM: 9.1 mg/dL (ref 8.9–10.3)
CO2: 20 mmol/L — AB (ref 22–32)
CREATININE: 0.66 mg/dL (ref 0.44–1.00)
Chloride: 108 mmol/L (ref 101–111)
GFR calc Af Amer: >60 mL/min (ref >60)
GFR calc non Af Amer: >60 mL/min (ref >60)
GLUCOSE: 112 mg/dL — AB (ref 65–99)
Potassium: 3.8 mmol/L (ref 3.5–5.1)
Sodium: 135 mmol/L (ref 135–145)
TOTAL PROTEIN: 9.2 g/dL — AB (ref 6.5–8.1)

## 2016-09-28 LAB — CBC WITH DIFFERENTIAL/PLATELET
BASOS ABS: 0.1 10*3/uL (ref 0–0.1)
Basophils Relative: 1 %
Eosinophils Absolute: 0.1 10*3/uL (ref 0–0.7)
Eosinophils Relative: 1 %
HCT: 30.7 % — ABNORMAL LOW (ref 35.0–47.0)
Hemoglobin: 10.2 g/dL — ABNORMAL LOW (ref 12.0–16.0)
LYMPHS PCT: 27 %
Lymphs Abs: 1.2 10*3/uL (ref 1.0–3.6)
MCH: 25.1 pg — AB (ref 26.0–34.0)
MCHC: 33.2 g/dL (ref 32.0–36.0)
MCV: 75.6 fL — AB (ref 80.0–100.0)
MONO ABS: 0.4 10*3/uL (ref 0.2–0.9)
Monocytes Relative: 10 %
Neutro Abs: 2.8 10*3/uL (ref 1.4–6.5)
Neutrophils Relative %: 61 %
PLATELETS: 286 10*3/uL (ref 150–440)
RBC: 4.06 MIL/uL (ref 3.80–5.20)
RDW: 19.6 % — AB (ref 11.5–14.5)
WBC: 4.6 10*3/uL (ref 3.6–11.0)

## 2016-09-28 LAB — FERRITIN: FERRITIN: 13 ng/mL (ref 11–307)

## 2016-09-28 LAB — IRON AND TIBC
IRON: 23 ug/dL — AB (ref 28–170)
SATURATION RATIOS: 5 % — AB (ref 10.4–31.8)
TIBC: 495 ug/dL — ABNORMAL HIGH (ref 250–450)
UIBC: 472 ug/dL

## 2016-09-28 MED ORDER — SODIUM CHLORIDE 0.9% FLUSH
10.0000 mL | INTRAVENOUS | Status: DC | PRN
  Administered 2016-09-28: 10 mL via INTRAVENOUS
  Filled 2016-09-28: qty 10

## 2016-09-28 MED ORDER — HEPARIN SOD (PORK) LOCK FLUSH 100 UNIT/ML IV SOLN
500.0000 [IU] | Freq: Once | INTRAVENOUS | Status: AC
  Administered 2016-09-28: 500 [IU] via INTRAVENOUS

## 2016-09-28 NOTE — Assessment & Plan Note (Addendum)
#   Right lung ca- with recurrence [based on imaging]- metastases to right lung ~10-58m  S/p 2 cycles of carbo-taxol- MARCH 29th 2018- CT s/p 2 cycles of carbo-taxol; PR- improving RUL/RLL nodules- mass. Chemotherapy held secondary to side effects [C discussion below]. Currently on surveillance.  # Continue chemotherapy break at this time ; we'll repeat CT scan in approximately 2 months ; If progression noted in future- docetaxel/ rami would be an option [given carbo- anaphylaxis]  # COPD/bronchitis-STABLE/improved.  # Anemia- Hb- 10; MCV-75; check iron studies.  # Elevated alkaline phos- 175; monitor for now..  # Peripheral neuropathy: continue gabapentin '100mg'$  twice daily.  #  follow up in 8 weeks/labs /MD. No chemo/ port flush.; CT prior.

## 2016-09-28 NOTE — Progress Notes (Signed)
[place] OFFICE PROGRESS NOTE  Patient Care Team: Center, [facility] as PCP - General (General Practice)  Cancer Staging No matching staging information was found for the patient.   Oncology History   Chief Complaint/Diagnosis:   1. 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 Faith Rogue, cardiothoracic surgeon) 2. Started on radiation and concurrent chemotherapy with carboplatinum Taxol from April of 2013 3. Finished radiation and chemotherapy with carboplatinum and Taxol in May of 2013 4. Stage IIIB (T4N3M0) NSCLC who completed a course of EBRT on Oct 07, 2011 with a total dose of 7000cGy.   5. Starting consolidation with 2 cycles of carboplatinum and Abraxane 6.patient is off chemotherapy since November of 2013.  Repeat CT scan shows stable disease(January, 2014) 7  Started been on Taxotere and  RAMICIRUMAB from June 07, 2013 8.chemotherapy with Taxotere  and  RAMUCIRUMAB discontinued in March of 2015 because of hemoptysis 9.Started [facility] April of 2015. 10NIVOLULAMABWas put on hold because of abnormal liver enzymes January of 2016  # AUG 2017- Right lung recurrence [2 nodules - ~1cm; stable lung mass/fibrosis]. START OPDIVO q 2W [small to Bx; s/p rad-onc eval]; opdivo x4- CT OCT 16th- PROGRESSION 2 lung nodules ~19m;   # MOLECULAR TESTING- ? Not enough tissue.      Primary cancer of bronchus of right lower lobe (HCC)     INTERVAL HISTORY:  Jeanette [id number].o.  female pleasant patient above history of stage IV lung cancer squamous cell/ recurrent- Patient s/p cycle of carbo/taxol Cycle #2 [07/06/2016]; Currently on surveillance [secondary to anaphylactic reaction to carbo].   Patient admits to having good Mother's Day weekend with the family. Overall she feels okay. Continues to chronic cough but no blood. She denies any weight loss.  Denies any headaches. No nausea no vomiting. Her appetite improved.  Energy levels are improved. Shortness of breath on exertion. This is not getting any worse.  REVIEW OF SYSTEMS:  A complete 10 point review of system is done which is negative except mentioned above/history of present illness.   PAST MEDICAL HISTORY :  Past Medical History:  Diagnosis Date  . Back pain, chronic   . [place]cin[place] of lung ([place] 07/[place]  . Cough   . Depression   . Dyspnea   . Emphysema of lung ([facility]   . Heart murmur   . Hep C w/o coma, chronic ([facility]   . Hypertension   . Lung cancer ([place]   . Swallowing difficulty     PAST SURGICAL HISTORY :   Past Surgical History:  Procedure Laterality Date  . ESOPHAGOGASTRODUODENOSCOPY N/A 06/08/2015   Procedure: ESOPHAGOGASTRODUODENOSCOPY (EGD);  Surgeon: PHulen Luster MD;  [facility];  Service: Gastroenterology;  Laterality: N/A;  . HEMORROIDECTOMY    . LUNG BIOPSY    . TUBAL LIGATION      FAMILY HISTORY :   Family History  Problem Relation Age of Onset  . Breast cancer Neg Hx     SOCIAL HISTORY:   Social History  Substance Use Topics  . Smoking status: Current Every Day Smoker    Packs/day: 0.25    Types: Cigarettes  . Smokeless tobacco: Never Used  . Alcohol use Yes     Comment: beer on saturday    ALLERGIES:  is allergic to carboplatin.  MEDICATIONS:  Current Outpatient Prescriptions  Medication Sig Dispense Refill  . cyclobenzaprine (  FLEXERIL) 10 MG tablet Take 10 mg by mouth at bedtime.    . Fluticasone-Salmeterol (ADVAIR DISKUS) 500-50 MCG/DOSE AEPB Inhale 1 puff into the lungs 2 (two) times daily. (Patient taking differently: Inhale 1 puff into the lungs 2 (two) times daily. ) 1 each 3  . lidocaine-prilocaine (EMLA) cream Apply 1 application topically as needed. To port a cath site- 1 hr prior to port access 30 g 3  . traMADol (ULTRAM) 50 MG tablet Take 2 tablets (100 mg total) by mouth every 8 (eight) hours as needed. 90 tablet 0   . gabapentin (NEURONTIN) 100 MG capsule Take 1 capsule (100 mg total) by mouth 2 (two) times daily. (Patient not taking: Reported on 09/28/2016) 90 capsule 3  . ipratropium-albuterol (DUONEB) 0.5-2.5 (3) MG/3ML SOLN Take 3 mLs by nebulization every 4 (four) hours as needed. (Patient not taking: Reported on 09/28/2016) 360 mL 3  . ondansetron (ZOFRAN) 8 MG tablet Take 1 tablet (8 mg total) by mouth every 8 (eight) hours as needed for nausea or vomiting (start 3 days; after chemo). (Patient not taking: Reported on 09/28/2016) 40 tablet 1  . prochlorperazine (COMPAZINE) 10 MG tablet Take 1 tablet (10 mg total) by mouth every 6 (six) hours as needed for nausea or vomiting. (Patient not taking: Reported on 09/28/2016) 40 tablet 1   No current facility-administered medications for this visit.    Facility-Administered Medications Ordered in Other Visits  Medication Dose Route Frequency Provider Last Rate Last Dose  . sodium chloride flush (NS) 0.9 % injection 10 mL  10 mL Intravenous PRN Cammie Sickle, MD   10 mL at 09/28/16 1426    PHYSICAL EXAMINATION: ECOG PERFORMANCE STATUS: 0 - Asymptomatic  BP (!) 160/97 (BP Location: Left Arm, Patient Position: Sitting)   Pulse 98   Temp 98 F (36.7 C) (Tympanic)   Resp 18   Ht '5\' 3"'$  (1.6 m)   Wt 117 lb (53.1 kg)   BMI 20.73 kg/m   Filed Weights   09/28/16 1438  Weight: 117 lb (53.1 kg)    GENERAL: Well-nourished well-developed; Alert, no distress and comfortable.   Alone.  EYES: no pallor or icterus OROPHARYNX: no thrush or ulceration; good dentition  NECK: supple, no masses felt LYMPH:  no palpable lymphadenopathy in the cervical, axillary or inguinal regions LUNGS: Decreased air entry to auscultation bilaterally and  No wheeze or crackles HEART/CVS: regular rate & rhythm and no murmurs; No lower extremity edema ABDOMEN:abdomen soft, non-tender and normal bowel sounds Musculoskeletal:no cyanosis of digits and no clubbing  PSYCH: alert &  oriented x 3 with fluent speech NEURO: no focal motor/sensory deficits SKIN:  no rashes or significant lesions  LABORATORY DATA:  I have reviewed the data as listed    Component Value Date/Time   NA 135 09/28/2016 1410   NA 135 08/29/2014 1457   K 3.8 09/28/2016 1410   K 3.7 08/29/2014 1457   CL 108 09/28/2016 1410   CL 105 08/29/2014 1457   CO2 20 (L) 09/28/2016 1410   CO2 24 08/29/2014 1457   GLUCOSE 112 (H) 09/28/2016 1410   GLUCOSE 98 08/29/2014 1457   BUN 17 09/28/2016 1410   BUN 8 08/29/2014 1457   CREATININE 0.66 09/28/2016 1410   CREATININE 0.64 08/29/2014 1457   CALCIUM 9.1 09/28/2016 1410   CALCIUM 9.0 08/29/2014 1457   PROT 9.2 (H) 09/28/2016 1410   PROT 9.3 (H) 08/29/2014 1457   ALBUMIN 3.5 09/28/2016 1410   ALBUMIN 3.6 08/29/2014  1457   AST 60 (H) 09/28/2016 1410   AST 111 (H) 08/29/2014 1457   ALT 33 09/28/2016 1410   ALT 78 (H) 08/29/2014 1457   ALKPHOS 175 (H) 09/28/2016 1410   ALKPHOS 168 (H) 08/29/2014 1457   BILITOT 0.5 09/28/2016 1410   BILITOT 0.6 08/29/2014 1457   GFRNONAA >60 09/28/2016 1410   GFRNONAA >60 08/29/2014 1457   GFRAA >60 09/28/2016 1410   GFRAA >60 08/29/2014 1457    No results found for: SPEP, UPEP  Lab Results  Component Value Date   WBC 4.6 09/28/2016   NEUTROABS 2.8 09/28/2016   HGB 10.2 (L) 09/28/2016   HCT 30.7 (L) 09/28/2016   MCV 75.6 (L) 09/28/2016   PLT 286 09/28/2016      Chemistry      Component Value Date/Time   NA 135 09/28/2016 1410   NA 135 08/29/2014 1457   K 3.8 09/28/2016 1410   K 3.7 08/29/2014 1457   CL 108 09/28/2016 1410   CL 105 08/29/2014 1457   CO2 20 (L) 09/28/2016 1410   CO2 24 08/29/2014 1457   BUN 17 09/28/2016 1410   BUN 8 08/29/2014 1457   CREATININE 0.66 09/28/2016 1410   CREATININE 0.64 08/29/2014 1457      Component Value Date/Time   CALCIUM 9.1 09/28/2016 1410   CALCIUM 9.0 08/29/2014 1457   ALKPHOS 175 (H) 09/28/2016 1410   ALKPHOS 168 (H) 08/29/2014 1457   AST 60 (H)  09/28/2016 1410   AST 111 (H) 08/29/2014 1457   ALT 33 09/28/2016 1410   ALT 78 (H) 08/29/2014 1457   BILITOT 0.5 09/28/2016 1410   BILITOT 0.6 08/29/2014 1457      IMPRESSION: 1. Mild Interval increase in size of RIGHT upper lobe pulmonary nodule. 2. Mild interval increase in size of RIGHT lower lobe pulmonary nodule. 3. Stable post radiation change in the RIGHT upper lobe and RIGHT lower lobe.   Electronically Signed   By: Suzy Bouchard M.D.   On: 05/20/2016 12:10  ------------------------------------------------------------------------------------------    IMPRESSION: 1. Interval decrease in size of right upper lobe pulmonary nodule. 2. Previously described right lower lobe pulmonary nodule has decreased in the interval. 3. Irregular lesion posterior right lower lobe described previously as post radiation change is stable. 4. Right upper lobe fibrosis similar to prior.   Electronically Signed   By: Misty Stanley M.D.   On: 08/13/2016 14:25 RADIOGRAPHIC STUDIES: I have personally reviewed the radiological images as listed and agreed with the findings in the report. No results found.   ASSESSMENT & PLAN:  Primary cancer of bronchus of right lower lobe ([place]) # Right lung ca- with recurrence [based on imaging]- metastases to right lung ~10-59m  S/p 2 cycles of carbo-taxol- MARCH 29th 2018- CT s/p 2 cycles of carbo-taxol; PR- improving RUL/RLL nodules- mass. Chemotherapy held secondary to side effects [C discussion below]. Currently on surveillance.  # Continue chemotherapy break at this time ; we'll repeat CT scan in approximately 2 months ; If progression noted in future- docetaxel/ rami would be an option [given carbo- anaphylaxis]  # COPD/bronchitis-STABLE/improved.  # Anemia- Hb- 10; MCV-75; check iron studies.  # Elevated alkaline phos- 175; monitor for now..  # Peripheral neuropathy: continue gabapentin '100mg'$  twice daily.  #  follow up in 8  weeks/labs /MD. No chemo/ port flush.; CT prior.   Orders Placed This Encounter  Procedures  . CT CHEST W CONTRAST    Standing Status:   Future  Standing Expiration Date:   11/28/2017    Order Specific Question:   Reason for Exam (SYMPTOM  OR DIAGNOSIS REQUIRED)    Answer:   lung cancer    Order Specific Question:   Is the patient pregnant?    Answer:   No    Order Specific Question:   Preferred imaging location?    Answer:    [facility]  . CBC with Differential/Platelet    Standing Status:   Future    Standing Expiration Date:   09/28/2017  . Comprehensive metabolic panel    Standing Status:   Future    Standing Expiration Date:   09/28/2017      Cammie Sickle, MD 09/28/2016 4:27 PM

## 2016-11-19 ENCOUNTER — Ambulatory Visit
Admission: RE | Admit: 2016-11-19 | Discharge: 2016-11-19 | Disposition: A | Discharge status: Home / Self Care | Payer: Medicare Other | Source: Ambulatory Visit | Attending: Internal Medicine | Admitting: Internal Medicine

## 2016-11-19 DIAGNOSIS — R918 Other nonspecific abnormal finding of lung field: Secondary | ICD-10-CM | POA: Insufficient documentation

## 2016-11-19 DIAGNOSIS — R590 Localized enlarged lymph nodes: Secondary | ICD-10-CM | POA: Insufficient documentation

## 2016-11-19 DIAGNOSIS — C3431 Malignant neoplasm of lower lobe, right bronchus or lung: Secondary | ICD-10-CM | POA: Diagnosis not present

## 2016-11-19 LAB — POCT I-STAT CREATININE: CREATININE: 0.7 mg/dL (ref 0.44–1.00)

## 2016-11-19 MED ORDER — IOPAMIDOL (ISOVUE-300) INJECTION 61%
75.0000 mL | Freq: Once | INTRAVENOUS | Status: AC | PRN
  Administered 2016-11-19: 75 mL via INTRAVENOUS

## 2016-11-23 ENCOUNTER — Inpatient Hospital Stay: Payer: Medicare Other

## 2016-11-23 ENCOUNTER — Inpatient Hospital Stay: Payer: Medicare Other | Attending: Internal Medicine | Admitting: Internal Medicine

## 2016-11-23 VITALS — BP 114/72 | HR 96 | Temp 97.5°F | Resp 20 | Wt 114.2 lb

## 2016-11-23 DIAGNOSIS — Z79899 Other long term (current) drug therapy: Secondary | ICD-10-CM | POA: Diagnosis not present

## 2016-11-23 DIAGNOSIS — T451X5A Adverse effect of antineoplastic and immunosuppressive drugs, initial encounter: Secondary | ICD-10-CM

## 2016-11-23 DIAGNOSIS — B182 Chronic viral hepatitis C: Secondary | ICD-10-CM | POA: Diagnosis not present

## 2016-11-23 DIAGNOSIS — I10 Essential (primary) hypertension: Secondary | ICD-10-CM | POA: Insufficient documentation

## 2016-11-23 DIAGNOSIS — Z923 Personal history of irradiation: Secondary | ICD-10-CM | POA: Insufficient documentation

## 2016-11-23 DIAGNOSIS — J449 Chronic obstructive pulmonary disease, unspecified: Secondary | ICD-10-CM

## 2016-11-23 DIAGNOSIS — D649 Anemia, unspecified: Secondary | ICD-10-CM | POA: Diagnosis not present

## 2016-11-23 DIAGNOSIS — Z9221 Personal history of antineoplastic chemotherapy: Secondary | ICD-10-CM | POA: Insufficient documentation

## 2016-11-23 DIAGNOSIS — R748 Abnormal levels of other serum enzymes: Secondary | ICD-10-CM

## 2016-11-23 DIAGNOSIS — F17210 Nicotine dependence, cigarettes, uncomplicated: Secondary | ICD-10-CM

## 2016-11-23 DIAGNOSIS — F329 Major depressive disorder, single episode, unspecified: Secondary | ICD-10-CM | POA: Diagnosis not present

## 2016-11-23 DIAGNOSIS — G8929 Other chronic pain: Secondary | ICD-10-CM | POA: Diagnosis not present

## 2016-11-23 DIAGNOSIS — C3431 Malignant neoplasm of lower lobe, right bronchus or lung: Secondary | ICD-10-CM

## 2016-11-23 DIAGNOSIS — D701 Agranulocytosis secondary to cancer chemotherapy: Secondary | ICD-10-CM

## 2016-11-23 DIAGNOSIS — G629 Polyneuropathy, unspecified: Secondary | ICD-10-CM | POA: Diagnosis not present

## 2016-11-23 LAB — CBC WITH DIFFERENTIAL/PLATELET
BASOS ABS: 0 10*3/uL (ref 0–0.1)
Basophils Relative: 1 %
Eosinophils Absolute: 0.1 10*3/uL (ref 0–0.7)
Eosinophils Relative: 2 %
HEMATOCRIT: 33.4 % — AB (ref 35.0–47.0)
Hemoglobin: 11.4 g/dL — ABNORMAL LOW (ref 12.0–16.0)
LYMPHS PCT: 26 %
Lymphs Abs: 1 10*3/uL (ref 1.0–3.6)
MCH: 26.7 pg (ref 26.0–34.0)
MCHC: 34.1 g/dL (ref 32.0–36.0)
MCV: 78.3 fL — ABNORMAL LOW (ref 80.0–100.0)
MONO ABS: 0.5 10*3/uL (ref 0.2–0.9)
MONOS PCT: 12 %
NEUTROS ABS: 2.4 10*3/uL (ref 1.4–6.5)
Neutrophils Relative %: 59 %
Platelets: 277 10*3/uL (ref 150–440)
RBC: 4.27 MIL/uL (ref 3.80–5.20)
RDW: 23 % — AB (ref 11.5–14.5)
WBC: 4.1 10*3/uL (ref 3.6–11.0)

## 2016-11-23 LAB — FERRITIN: Ferritin: 20 ng/mL (ref 11–307)

## 2016-11-23 LAB — COMPREHENSIVE METABOLIC PANEL
ALK PHOS: 158 U/L — AB (ref 38–126)
ALT: 40 U/L (ref 14–54)
AST: 81 U/L — AB (ref 15–41)
Albumin: 3.8 g/dL (ref 3.5–5.0)
Anion gap: 8 (ref 5–15)
BILIRUBIN TOTAL: 0.7 mg/dL (ref 0.3–1.2)
BUN: 14 mg/dL (ref 6–20)
CALCIUM: 9.2 mg/dL (ref 8.9–10.3)
CO2: 22 mmol/L (ref 22–32)
CREATININE: 0.63 mg/dL (ref 0.44–1.00)
Chloride: 105 mmol/L (ref 101–111)
GFR calc Af Amer: >60 mL/min (ref >60)
Glucose, Bld: 140 mg/dL — ABNORMAL HIGH (ref 65–99)
POTASSIUM: 3.2 mmol/L — AB (ref 3.5–5.1)
Sodium: 135 mmol/L (ref 135–145)
TOTAL PROTEIN: 9.6 g/dL — AB (ref 6.5–8.1)

## 2016-11-23 LAB — IRON AND TIBC
IRON: 62 ug/dL (ref 28–170)
Saturation Ratios: 13 % (ref 10.4–31.8)
TIBC: 486 ug/dL — ABNORMAL HIGH (ref 250–450)
UIBC: 424 ug/dL

## 2016-11-23 MED ORDER — HEPARIN SOD (PORK) LOCK FLUSH 100 UNIT/ML IV SOLN
500.0000 [IU] | Freq: Once | INTRAVENOUS | Status: AC
  Administered 2016-11-23: 500 [IU] via INTRAVENOUS

## 2016-11-23 MED ORDER — SODIUM CHLORIDE 0.9% FLUSH
10.0000 mL | Freq: Once | INTRAVENOUS | Status: AC
  Administered 2016-11-23: 10 mL via INTRAVENOUS
  Filled 2016-11-23: qty 10

## 2016-11-23 NOTE — Progress Notes (Signed)
[place] OFFICE PROGRESS NOTE  Patient Care Team: Center, [facility] as PCP - General (General Practice)  Cancer Staging No matching staging information was found for the patient.   Oncology History   Chief Complaint/Diagnosis:   1. 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 Faith Rogue, cardiothoracic surgeon) 2. Started on radiation and concurrent chemotherapy with carboplatinum Taxol from April of 2013 3. Finished radiation and chemotherapy with carboplatinum and Taxol in May of 2013 4. Stage IIIB (T4N3M0) NSCLC who completed a course of EBRT on Oct 07, 2011 with a total dose of 7000cGy.   5. Starting consolidation with 2 cycles of carboplatinum and Abraxane 6.patient is off chemotherapy since November of 2013.  Repeat CT scan shows stable disease(January, 2014) 7  Started been on Taxotere and  RAMICIRUMAB from June 07, 2013 8.chemotherapy with Taxotere  and  RAMUCIRUMAB discontinued in March of 2015 because of hemoptysis.  9.Started onNIVOLULAMAB April of 2015. 10NIVOLULAMABWas put on hold because of abnormal liver enzymes January of 2016  # AUG 2017- Right lung recurrence [2 nodules - ~1cm; stable lung mass/fibrosis]. START OPDIVO q 2W [small to Bx; s/p rad-onc eval]; opdivo x4- CT OCT 16th- PROGRESSION 2 lung nodules ~1mm;   # JAN 2018-carbo-TAXOL s/p 2 cycles- CT- PR; but carbo-anaphylaxis; STOPPED chemo  # July 2018- PROGRESSION- GEMCITABINE  # MOLECULAR TESTING- ? Not enough tissue.      Primary cancer of bronchus of right lower lobe (HCC)     INTERVAL HISTORY:  Jeanette Jones 61 y.o.  female pleasant patient above history of stage IV lung cancer squamous cell/ recurrent- Patient s/p cycle of carbo/taxol Cycle #2 [07/06/2016]; Currently on surveillance [secondary to anaphylactic reaction to carbo]; patient is here to review the results of  her restaging CAT scan.  Overall she feels okay. Continues to chronic cough but with mild intermittent blood. She denies any weight loss. Denies any headaches. No nausea no vomiting. Her appetite improved.  Energy levels are improved. She has chronic Shortness of breath on exertion. This is not getting any worse.  REVIEW OF SYSTEMS:  A complete 10 point review of system is done which is negative except mentioned above/history of present illness.   PAST MEDICAL HISTORY :  Past Medical History:  Diagnosis Date  . Back pain, chronic   . Carcinoma of lung ([place]) 07/2011  . Cough   . Depression   . Dyspnea   . Emphysema of lung ([place])   . Heart murmur   . Hep C w/o coma, chronic ([place])   . Hypertension   . Lung cancer ([place])   . Swallowing difficulty     PAST SURGICAL HISTORY :   Past Surgical History:  Procedure Laterality Date  . ESOPHAGOGASTRODUODENOSCOPY N/A 06/08/2015   Procedure: ESOPHAGOGASTRODUODENOSCOPY (EGD);  Surgeon: Hulen Luster, MD;  Location: [facility] ENDOSCOPY;  Service: Gastroenterology;  Laterality: N/A;  . HEMORROIDECTOMY    . LUNG BIOPSY    . TUBAL LIGATION      FAMILY HISTORY :   Family History  Problem Relation Age of Onset  . Breast cancer Neg Hx     SOCIAL HISTORY:   Social History  Substance Use Topics  . Smoking status: Current Every Day Smoker    Packs/day: 0.25    Types: Cigarettes  . Smokeless tobacco: Never Used  . Alcohol use Yes     Comment: beer  on saturday    ALLERGIES:  is allergic to carboplatin.  MEDICATIONS:  Current Outpatient Prescriptions  Medication Sig Dispense Refill  . Fluticasone-Salmeterol (ADVAIR DISKUS) 500-50 MCG/DOSE AEPB Inhale 1 puff into the lungs 2 (two) times daily. (Patient taking differently: Inhale 1 puff into the lungs 2 (two) times daily. ) 1 each 3  . cyclobenzaprine (FLEXERIL) 10 MG tablet Take 10 mg by mouth at bedtime.    . gabapentin (NEURONTIN) 100 MG capsule Take 1 capsule (100 mg total) by mouth 2 (two) times  daily. (Patient not taking: Reported on 09/28/2016) 90 capsule 3  . ipratropium-albuterol (DUONEB) 0.5-2.5 (3) MG/3ML SOLN Take 3 mLs by nebulization every 4 (four) hours as needed. (Patient not taking: Reported on 11/23/2016) 360 mL 3  . lidocaine-prilocaine (EMLA) cream Apply 1 application topically as needed. To port a cath site- 1 hr prior to port access (Patient not taking: Reported on 11/23/2016) 30 g 3  . ondansetron (ZOFRAN) 8 MG tablet Take 1 tablet (8 mg total) by mouth every 8 (eight) hours as needed for nausea or vomiting (start 3 days; after chemo). (Patient not taking: Reported on 09/28/2016) 40 tablet 1  . prochlorperazine (COMPAZINE) 10 MG tablet Take 1 tablet (10 mg total) by mouth every 6 (six) hours as needed for nausea or vomiting. (Patient not taking: Reported on 09/28/2016) 40 tablet 1  . traMADol (ULTRAM) 50 MG tablet Take 2 tablets (100 mg total) by mouth every 8 (eight) hours as needed. (Patient not taking: Reported on 11/23/2016) 90 tablet 0   No current facility-administered medications for this visit.     PHYSICAL EXAMINATION: ECOG PERFORMANCE STATUS: 0 - Asymptomatic  BP 114/72 (BP Location: Right Arm, Patient Position: Sitting)   Pulse 96   Temp (!) 97.5 F (36.4 C) (Tympanic)   Resp 20   Wt 114 lb 4 oz (51.8 kg)   BMI 20.24 kg/m   Filed Weights   11/23/16 1342  Weight: 114 lb 4 oz (51.8 kg)    GENERAL: Well-nourished well-developed; Alert, no distress and comfortable.   Alone.  EYES: no pallor or icterus OROPHARYNX: no thrush or ulceration; good dentition  NECK: supple, no masses felt LYMPH:  no palpable lymphadenopathy in the cervical, axillary or inguinal regions LUNGS: Decreased air entry to auscultation bilaterally and  No wheeze or crackles HEART/CVS: regular rate & rhythm and no murmurs; No lower extremity edema ABDOMEN:abdomen soft, non-tender and normal bowel sounds Musculoskeletal:no cyanosis of digits and no clubbing  PSYCH: alert & oriented x 3  with fluent speech NEURO: no focal motor/sensory deficits SKIN:  no rashes or significant lesions  LABORATORY DATA:  I have reviewed the data as listed    Component Value Date/Time   NA 135 11/23/2016 1315   NA 135 08/29/2014 1457   K 3.2 (L) 11/23/2016 1315   K 3.7 08/29/2014 1457   CL 105 11/23/2016 1315   CL 105 08/29/2014 1457   CO2 22 11/23/2016 1315   CO2 24 08/29/2014 1457   GLUCOSE 140 (H) 11/23/2016 1315   GLUCOSE 98 08/29/2014 1457   BUN 14 11/23/2016 1315   BUN 8 08/29/2014 1457   CREATININE 0.63 11/23/2016 1315   CREATININE 0.64 08/29/2014 1457   CALCIUM 9.2 11/23/2016 1315   CALCIUM 9.0 08/29/2014 1457   PROT 9.6 (H) 11/23/2016 1315   PROT 9.3 (H) 08/29/2014 1457   ALBUMIN 3.8 11/23/2016 1315   ALBUMIN 3.6 08/29/2014 1457   AST 81 (H) 11/23/2016 1315   AST  111 (H) 08/29/2014 1457   ALT 40 11/23/2016 1315   ALT 78 (H) 08/29/2014 1457   ALKPHOS 158 (H) 11/23/2016 1315   ALKPHOS 168 (H) 08/29/2014 1457   BILITOT 0.7 11/23/2016 1315   BILITOT 0.6 08/29/2014 1457   GFRNONAA >60 11/23/2016 1315   GFRNONAA >60 08/29/2014 1457   GFRAA >60 11/23/2016 1315   GFRAA >60 08/29/2014 1457    No results found for: SPEP, UPEP  Lab Results  Component Value Date   WBC 4.1 11/23/2016   NEUTROABS 2.4 11/23/2016   HGB 11.4 (L) 11/23/2016   HCT 33.4 (L) 11/23/2016   MCV 78.3 (L) 11/23/2016   PLT 277 11/23/2016      Chemistry      Component Value Date/Time   NA 135 11/23/2016 1315   NA 135 08/29/2014 1457   K 3.2 (L) 11/23/2016 1315   K 3.7 08/29/2014 1457   CL 105 11/23/2016 1315   CL 105 08/29/2014 1457   CO2 22 11/23/2016 1315   CO2 24 08/29/2014 1457   BUN 14 11/23/2016 1315   BUN 8 08/29/2014 1457   CREATININE 0.63 11/23/2016 1315   CREATININE 0.64 08/29/2014 1457      Component Value Date/Time   CALCIUM 9.2 11/23/2016 1315   CALCIUM 9.0 08/29/2014 1457   ALKPHOS 158 (H) 11/23/2016 1315   ALKPHOS 168 (H) 08/29/2014 1457   AST 81 (H) 11/23/2016 1315    AST 111 (H) 08/29/2014 1457   ALT 40 11/23/2016 1315   ALT 78 (H) 08/29/2014 1457   BILITOT 0.7 11/23/2016 1315   BILITOT 0.6 08/29/2014 1457      IMPRESSION: 1. Mild Interval increase in size of RIGHT upper lobe pulmonary nodule. 2. Mild interval increase in size of RIGHT lower lobe pulmonary nodule. 3. Stable post radiation change in the RIGHT upper lobe and RIGHT lower lobe.   Electronically Signed   By: Suzy Bouchard M.D.   On: 05/20/2016 12:10  ------------------------------------------------------------------------------------------    IMPRESSION: 1. Interval decrease in size of right upper lobe pulmonary nodule. 2. Previously described right lower lobe pulmonary nodule has decreased in the interval. 3. Irregular lesion posterior right lower lobe described previously as post radiation change is stable. 4. Right upper lobe fibrosis similar to prior.   Electronically Signed   By: Misty Stanley M.D.   On: 08/13/2016 14:25 RADIOGRAPHIC STUDIES: I have personally reviewed the radiological images as listed and agreed with the findings in the report. No results found.   ASSESSMENT & PLAN:  Primary cancer of bronchus of right lower lobe ([place]) # Right lung ca- with recurrence [based on imaging]- metastases to right lung ~10-58m;  S/p 2 cycles of carbo-taxol [in Feb 2018]; on surveillance. July 5th 2018- progression of the right lower lobe primary mass; and also the right upper lobe and the right medial lobe lesions.  # Recommend gemcitabine single would be an option [given carbo- anaphylaxis]; 2 weeks on; 1 week off. Discussed treatments are palliative; not curative.  # COPD/bronchitis-STABLE/improved.  # Anemia- Hb- 10; MCV-75; Iron studies- LOW; recommend IV iron weekly with chemo.   # Elevated alkaline phos- 175; monitor for now.  # Peripheral neuropathy: continue gabapentin 100mg  twice daily.  # Chemotherapy next week; 2 weeks chemotherapy labs;  follow-up with me in 4 weeks 8/6/chemo/labs.   #  I reviewed the blood work- with the patient in detail; also reviewed the imaging independently [as summarized above]; and with the patient in detail.   #  40 minutes face-to-face with the patient discussing the above plan of care; more than 50% of time spent on prognosis/ natural history; counseling and coordination.    Orders Placed This Encounter  Procedures  . CBC with Differential    Standing Status:   Future    Standing Expiration Date:   11/23/2017  . Basic metabolic panel    Standing Status:   Future    Standing Expiration Date:   11/23/2017  . CBC with Differential    Standing Status:   Future    Standing Expiration Date:   11/23/2017  . Comprehensive metabolic panel    Standing Status:   Future    Standing Expiration Date:   11/23/2017      Cammie Sickle, MD 11/23/2016 6:45 PM

## 2016-11-23 NOTE — Assessment & Plan Note (Addendum)
#   Right lung ca- with recurrence [based on imaging]- metastases to right lung ~10-23m;  S/p 2 cycles of carbo-taxol [in Feb 2018]; on surveillance. July 5th 2018- progression of the right lower lobe primary mass; and also the right upper lobe and the right medial lobe lesions.  # Recommend gemcitabine single would be an option [given carbo- anaphylaxis]; 2 weeks on; 1 week off. Discussed treatments are palliative; not curative.  # COPD/bronchitis-STABLE/improved.  # Anemia- Hb- 10; MCV-75; Iron studies- LOW; recommend IV iron weekly with chemo.   # Elevated alkaline phos- 175; monitor for now.  # Peripheral neuropathy: continue gabapentin 100mg  twice daily.  # Chemotherapy next week; 2 weeks chemotherapy labs; follow-up with me in 4 weeks 8/6/chemo/labs.   #  I reviewed the blood work- with the patient in detail; also reviewed the imaging independently [as summarized above]; and with the patient in detail.   # 40 minutes face-to-face with the patient discussing the above plan of care; more than 50% of time spent on prognosis/ natural history; counseling and coordination.

## 2016-11-23 NOTE — Progress Notes (Signed)
DISCONTINUE OFF PATHWAY REGIMEN - Non-Small Cell Lung   [id number]:Carboplatin AUC=6 + Paclitaxel 175 mg/m2 q21 Days:   A cycle is every 21 days:     Paclitaxel        Dose Mod: None     Carboplatin        Dose Mod: None  **Always confirm dose/schedule in your pharmacy ordering system**    REASON: Toxicities / Adverse Event PRIOR TREATMENT: Off Pathway: Carboplatin AUC=6 + Paclitaxel 175 mg/m2 q21 Days TREATMENT RESPONSE: Partial Response (PR)  START OFF PATHWAY REGIMEN - Non-Small Cell Lung   [id number]:Gemcitabine 1,000 mg/m2 D1, 8  q21 Days:   A cycle is every 21 days:     Gemcitabine   **Always confirm dose/schedule in your pharmacy ordering system**    Patient Characteristics: Local Recurrence AJCC T Category: T4 Current Disease Status: Local Recurrence AJCC N Category: N2 AJCC M Category: M0 AJCC 8 Stage Grouping: IIIB Intent of Therapy: Non-Curative / Palliative Intent, Discussed with Patient

## 2016-12-01 ENCOUNTER — Inpatient Hospital Stay: Payer: Medicare Other

## 2016-12-01 VITALS — BP 116/72 | HR 89 | Temp 97.7°F | Resp 16

## 2016-12-01 DIAGNOSIS — D508 Other iron deficiency anemias: Secondary | ICD-10-CM

## 2016-12-01 DIAGNOSIS — C3431 Malignant neoplasm of lower lobe, right bronchus or lung: Secondary | ICD-10-CM

## 2016-12-01 MED ORDER — PROCHLORPERAZINE MALEATE 10 MG PO TABS
10.0000 mg | ORAL_TABLET | Freq: Once | ORAL | Status: AC
  Administered 2016-12-01: 10 mg via ORAL
  Filled 2016-12-01: qty 1

## 2016-12-01 MED ORDER — IRON SUCROSE 20 MG/ML IV SOLN
200.0000 mg | Freq: Once | INTRAVENOUS | Status: AC
  Administered 2016-12-01: 200 mg via INTRAVENOUS
  Filled 2016-12-01: qty 10

## 2016-12-01 MED ORDER — SODIUM CHLORIDE 0.9% FLUSH
10.0000 mL | INTRAVENOUS | Status: DC | PRN
  Administered 2016-12-01: 10 mL
  Filled 2016-12-01: qty 10

## 2016-12-01 MED ORDER — HEPARIN SOD (PORK) LOCK FLUSH 100 UNIT/ML IV SOLN
500.0000 [IU] | Freq: Once | INTRAVENOUS | Status: AC | PRN
  Administered 2016-12-01: 500 [IU]
  Filled 2016-12-01: qty 5

## 2016-12-01 MED ORDER — SODIUM CHLORIDE 0.9 % IV SOLN
Freq: Once | INTRAVENOUS | Status: AC
  Administered 2016-12-01: 10:00:00 via INTRAVENOUS
  Filled 2016-12-01: qty 1000

## 2016-12-01 MED ORDER — SODIUM CHLORIDE 0.9 % IV SOLN: 200.0000 mg | Freq: Once | INTRAVENOUS | Status: DC

## 2016-12-01 MED ORDER — SODIUM CHLORIDE 0.9 % IV SOLN: 1000.0000 mg/m2 | Freq: Once | INTRAVENOUS | Status: DC

## 2016-12-01 MED ORDER — SODIUM CHLORIDE 0.9 % IV SOLN
1600.0000 mg | Freq: Once | INTRAVENOUS | Status: AC
  Administered 2016-12-01: 1600 mg via INTRAVENOUS
  Filled 2016-12-01: qty 26.3

## 2016-12-07 ENCOUNTER — Other Ambulatory Visit: Payer: Self-pay | Admitting: *Deleted

## 2016-12-07 ENCOUNTER — Inpatient Hospital Stay: Payer: Medicare Other

## 2016-12-07 ENCOUNTER — Other Ambulatory Visit: Payer: Self-pay | Admitting: Internal Medicine

## 2016-12-07 VITALS — BP 100/68 | HR 84 | Temp 96.9°F | Resp 16

## 2016-12-07 DIAGNOSIS — D508 Other iron deficiency anemias: Secondary | ICD-10-CM

## 2016-12-07 DIAGNOSIS — C3431 Malignant neoplasm of lower lobe, right bronchus or lung: Secondary | ICD-10-CM

## 2016-12-07 DIAGNOSIS — F1010 Alcohol abuse, uncomplicated: Secondary | ICD-10-CM

## 2016-12-07 DIAGNOSIS — E876 Hypokalemia: Secondary | ICD-10-CM

## 2016-12-07 LAB — CBC WITH DIFFERENTIAL/PLATELET
BASOS PCT: 1 %
Basophils Absolute: 0 10*3/uL (ref 0–0.1)
Eosinophils Absolute: 0 10*3/uL (ref 0–0.7)
Eosinophils Relative: 1 %
HEMATOCRIT: 28.3 % — AB (ref 35.0–47.0)
Hemoglobin: 9.6 g/dL — ABNORMAL LOW (ref 12.0–16.0)
LYMPHS ABS: 1.2 10*3/uL (ref 1.0–3.6)
Lymphocytes Relative: 44 %
MCH: 27.6 pg (ref 26.0–34.0)
MCHC: 34.1 g/dL (ref 32.0–36.0)
MCV: 81.1 fL (ref 80.0–100.0)
MONO ABS: 0.1 10*3/uL — AB (ref 0.2–0.9)
MONOS PCT: 4 %
NEUTROS ABS: 1.4 10*3/uL (ref 1.4–6.5)
Neutrophils Relative %: 50 %
Platelets: 212 10*3/uL (ref 150–440)
RBC: 3.49 MIL/uL — ABNORMAL LOW (ref 3.80–5.20)
RDW: 21.3 % — AB (ref 11.5–14.5)
WBC: 2.8 10*3/uL — ABNORMAL LOW (ref 3.6–11.0)

## 2016-12-07 LAB — ETHANOL: Alcohol, Ethyl (B): 66 mg/dL — ABNORMAL HIGH (ref <5)

## 2016-12-07 LAB — BASIC METABOLIC PANEL
Anion gap: 8 (ref 5–15)
BUN: 16 mg/dL (ref 6–20)
CHLORIDE: 99 mmol/L — AB (ref 101–111)
CO2: 23 mmol/L (ref 22–32)
CREATININE: 0.92 mg/dL (ref 0.44–1.00)
Calcium: 8.8 mg/dL — ABNORMAL LOW (ref 8.9–10.3)
GFR calc non Af Amer: >60 mL/min (ref >60)
GLUCOSE: 99 mg/dL (ref 65–99)
Potassium: 2.9 mmol/L — ABNORMAL LOW (ref 3.5–5.1)
Sodium: 130 mmol/L — ABNORMAL LOW (ref 135–145)

## 2016-12-07 MED ORDER — SODIUM CHLORIDE 0.9 % IJ SOLN
3.0000 mL | Freq: Once | INTRAMUSCULAR | Status: DC | PRN
  Filled 2016-12-07: qty 10

## 2016-12-07 MED ORDER — POTASSIUM CHLORIDE 20 MEQ/15ML (10%) PO SOLN: 20.0000 meq | Freq: Two times a day (BID) | ORAL | 0 refills | Status: DC

## 2016-12-07 MED ORDER — IRON SUCROSE 20 MG/ML IV SOLN
200.0000 mg | Freq: Once | INTRAVENOUS | Status: AC
  Administered 2016-12-07: 200 mg via INTRAVENOUS
  Filled 2016-12-07: qty 10

## 2016-12-07 MED ORDER — SODIUM CHLORIDE 0.9 % IJ SOLN
10.0000 mL | INTRAMUSCULAR | Status: DC | PRN
  Filled 2016-12-07: qty 10

## 2016-12-07 MED ORDER — SODIUM CHLORIDE 0.9 % IV SOLN
Freq: Once | INTRAVENOUS | Status: DC
  Filled 2016-12-07: qty 1000

## 2016-12-07 MED ORDER — SODIUM CHLORIDE 0.9 % IV SOLN: 1000.0000 mg/m2 | Freq: Once | INTRAVENOUS | Status: DC

## 2016-12-07 MED ORDER — PROCHLORPERAZINE MALEATE 10 MG PO TABS: 10.0000 mg | ORAL_TABLET | Freq: Once | ORAL | Status: DC

## 2016-12-07 MED ORDER — HEPARIN SOD (PORK) LOCK FLUSH 100 UNIT/ML IV SOLN: 500.0000 [IU] | Freq: Once | INTRAVENOUS | Status: DC | PRN

## 2016-12-07 MED ORDER — ALTEPLASE 2 MG IJ SOLR: 2.0000 mg | Freq: Once | INTRAMUSCULAR | Status: DC | PRN

## 2016-12-07 MED ORDER — HEPARIN SOD (PORK) LOCK FLUSH 100 UNIT/ML IV SOLN: 250.0000 [IU] | Freq: Once | INTRAVENOUS | Status: DC | PRN

## 2016-12-07 MED ORDER — SODIUM CHLORIDE 0.9 % IV SOLN
Freq: Once | INTRAVENOUS | Status: AC
  Administered 2016-12-07: 11:00:00 via INTRAVENOUS
  Filled 2016-12-07: qty 15

## 2016-12-07 MED ORDER — SODIUM CHLORIDE 0.9% FLUSH
10.0000 mL | INTRAVENOUS | Status: DC | PRN
  Administered 2016-12-07: 10 mL via INTRAVENOUS
  Filled 2016-12-07: qty 10

## 2016-12-07 MED ORDER — HEPARIN SOD (PORK) LOCK FLUSH 100 UNIT/ML IV SOLN
500.0000 [IU] | Freq: Once | INTRAVENOUS | Status: AC
  Administered 2016-12-07: 500 [IU] via INTRAVENOUS
  Filled 2016-12-07: qty 5

## 2016-12-07 MED ORDER — SODIUM CHLORIDE 0.9 % IV SOLN
Freq: Once | INTRAVENOUS | Status: AC
  Administered 2016-12-07: 10:00:00 via INTRAVENOUS
  Filled 2016-12-07: qty 1000

## 2016-12-07 NOTE — Progress Notes (Signed)
MD informed of patient's blood alcohol level today. Per nurses, pt came to clinic today for chemotherapy. Signs of intoxication noticed by infusion RNs. Pt admitted to alcohol use and "partying all evening." MD notified and asked that blood alcohol level be drawn before chemotherapy is infusion. Due to high level of blood alcohol, chemotherapy will be held today for safety percautions. MD will discuss pt's alcohol use at next visit.

## 2016-12-21 ENCOUNTER — Inpatient Hospital Stay: Payer: Medicare Other | Attending: Internal Medicine | Admitting: Internal Medicine

## 2016-12-21 ENCOUNTER — Inpatient Hospital Stay: Payer: Medicare Other

## 2016-12-21 VITALS — BP 107/73 | HR 76 | Temp 97.2°F | Resp 20 | Ht 63.0 in | Wt 114.8 lb

## 2016-12-21 DIAGNOSIS — R748 Abnormal levels of other serum enzymes: Secondary | ICD-10-CM | POA: Diagnosis not present

## 2016-12-21 DIAGNOSIS — J449 Chronic obstructive pulmonary disease, unspecified: Secondary | ICD-10-CM | POA: Diagnosis not present

## 2016-12-21 DIAGNOSIS — F17210 Nicotine dependence, cigarettes, uncomplicated: Secondary | ICD-10-CM | POA: Diagnosis not present

## 2016-12-21 DIAGNOSIS — C3431 Malignant neoplasm of lower lobe, right bronchus or lung: Secondary | ICD-10-CM

## 2016-12-21 DIAGNOSIS — I10 Essential (primary) hypertension: Secondary | ICD-10-CM | POA: Insufficient documentation

## 2016-12-21 DIAGNOSIS — B182 Chronic viral hepatitis C: Secondary | ICD-10-CM | POA: Diagnosis not present

## 2016-12-21 DIAGNOSIS — Z79899 Other long term (current) drug therapy: Secondary | ICD-10-CM | POA: Insufficient documentation

## 2016-12-21 DIAGNOSIS — Z5111 Encounter for antineoplastic chemotherapy: Secondary | ICD-10-CM | POA: Insufficient documentation

## 2016-12-21 DIAGNOSIS — F10129 Alcohol abuse with intoxication, unspecified: Secondary | ICD-10-CM | POA: Diagnosis not present

## 2016-12-21 DIAGNOSIS — M545 Low back pain: Secondary | ICD-10-CM | POA: Diagnosis not present

## 2016-12-21 DIAGNOSIS — F329 Major depressive disorder, single episode, unspecified: Secondary | ICD-10-CM | POA: Diagnosis not present

## 2016-12-21 DIAGNOSIS — C7801 Secondary malignant neoplasm of right lung: Secondary | ICD-10-CM

## 2016-12-21 LAB — CBC WITH DIFFERENTIAL/PLATELET
BASOS ABS: 0 10*3/uL (ref 0–0.1)
BASOS PCT: 1 %
EOS ABS: 0.1 10*3/uL (ref 0–0.7)
EOS PCT: 2 %
HCT: 32.8 % — ABNORMAL LOW (ref 35.0–47.0)
Hemoglobin: 11.3 g/dL — ABNORMAL LOW (ref 12.0–16.0)
LYMPHS PCT: 39 %
Lymphs Abs: 1.6 10*3/uL (ref 1.0–3.6)
MCH: 30.2 pg (ref 26.0–34.0)
MCHC: 34.5 g/dL (ref 32.0–36.0)
MCV: 87.5 fL (ref 80.0–100.0)
Monocytes Absolute: 0.5 10*3/uL (ref 0.2–0.9)
Monocytes Relative: 12 %
Neutro Abs: 1.9 10*3/uL (ref 1.4–6.5)
Neutrophils Relative %: 46 %
PLATELETS: 377 10*3/uL (ref 150–440)
RBC: 3.75 MIL/uL — AB (ref 3.80–5.20)
RDW: 27.5 % — ABNORMAL HIGH (ref 11.5–14.5)
WBC: 4 10*3/uL (ref 3.6–11.0)

## 2016-12-21 LAB — COMPREHENSIVE METABOLIC PANEL
ALBUMIN: 4 g/dL (ref 3.5–5.0)
ALT: 54 U/L (ref 14–54)
AST: 76 U/L — AB (ref 15–41)
Alkaline Phosphatase: 146 U/L — ABNORMAL HIGH (ref 38–126)
Anion gap: 7 (ref 5–15)
BUN: 10 mg/dL (ref 6–20)
CHLORIDE: 106 mmol/L (ref 101–111)
CO2: 22 mmol/L (ref 22–32)
CREATININE: 0.64 mg/dL (ref 0.44–1.00)
Calcium: 9.1 mg/dL (ref 8.9–10.3)
GFR calc Af Amer: >60 mL/min (ref >60)
GFR calc non Af Amer: >60 mL/min (ref >60)
Glucose, Bld: 97 mg/dL (ref 65–99)
POTASSIUM: 3.3 mmol/L — AB (ref 3.5–5.1)
SODIUM: 135 mmol/L (ref 135–145)
Total Bilirubin: 0.3 mg/dL (ref 0.3–1.2)
Total Protein: 8.5 g/dL — ABNORMAL HIGH (ref 6.5–8.1)

## 2016-12-21 MED ORDER — HEPARIN SOD (PORK) LOCK FLUSH 100 UNIT/ML IV SOLN
500.0000 [IU] | Freq: Once | INTRAVENOUS | Status: AC
  Administered 2016-12-21: 500 [IU] via INTRAVENOUS
  Filled 2016-12-21: qty 5

## 2016-12-21 MED ORDER — SODIUM CHLORIDE 0.9% FLUSH
10.0000 mL | INTRAVENOUS | Status: DC | PRN
  Administered 2016-12-21: 10 mL via INTRAVENOUS
  Filled 2016-12-21: qty 10

## 2016-12-21 MED ORDER — SODIUM CHLORIDE 0.9 % IV SOLN
1600.0000 mg | Freq: Once | INTRAVENOUS | Status: AC
  Administered 2016-12-21: 1600 mg via INTRAVENOUS
  Filled 2016-12-21: qty 26.3

## 2016-12-21 MED ORDER — SODIUM CHLORIDE 0.9 % IV SOLN
Freq: Once | INTRAVENOUS | Status: AC
Start: 2016-12-21 — End: 2016-12-21
  Administered 2016-12-21: 11:00:00 via INTRAVENOUS
  Filled 2016-12-21: qty 1000

## 2016-12-21 MED ORDER — PROCHLORPERAZINE MALEATE 10 MG PO TABS
10.0000 mg | ORAL_TABLET | Freq: Once | ORAL | Status: AC
  Administered 2016-12-21: 10 mg via ORAL
  Filled 2016-12-21: qty 1

## 2016-12-21 MED ORDER — TRAMADOL HCL 50 MG PO TABS: 50.0000 mg | ORAL_TABLET | Freq: Three times a day (TID) | ORAL | 0 refills | Status: DC | PRN

## 2016-12-21 NOTE — Progress Notes (Signed)
[place] OFFICE PROGRESS NOTE  Patient Care Team: Center, [facility] as PCP - General (General Practice)  Cancer Staging No matching staging information was found for the patient.   Oncology History   Chief Complaint/Diagnosis:   1. 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 Faith Rogue, cardiothoracic surgeon) 2. Started on radiation and concurrent chemotherapy with carboplatinum Taxol from April of 2013 3. Finished radiation and chemotherapy with carboplatinum and Taxol in May of 2013 4. Stage IIIB (T4N3M0) NSCLC who completed a course of EBRT on Oct 07, 2011 with a total dose of 7000cGy.   5. Starting consolidation with 2 cycles of carboplatinum and Abraxane 6.patient is off chemotherapy since November of 2013.  Repeat CT scan shows stable disease(January, 2014) 7  Started been on Taxotere and  RAMICIRUMAB from June 07, 2013 8.chemotherapy with Taxotere  and  RAMUCIRUMAB discontinued in March of 2015 because of hemoptysis.  9.Started onNIVOLULAMAB April of 2015. 10NIVOLULAMABWas put on hold because of abnormal liver enzymes January of 2016  # AUG 2017- Right lung recurrence [2 nodules - ~1cm; stable lung mass/fibrosis]. START OPDIVO q 2W [small to Bx; s/p rad-onc eval]; opdivo x4- CT OCT 16th- PROGRESSION 2 lung nodules ~15mm;   # JAN 2018-carbo-TAXOL s/p 2 cycles- CT- PR; but carbo-anaphylaxis; STOPPED chemo  # July 2018- PROGRESSION- GEMCITABINE  # MOLECULAR TESTING- ? Not enough tissue.      Primary cancer of bronchus of right lower lobe (HCC)     INTERVAL HISTORY:  Jeanette Jones 61 y.o.  female pleasant patient above history of stage IV lung cancer squamous cell/ recurrent- Most recently on gemcitabine single agent status post cycle #1.  Chemotherapy was held last cycle because of alcohol intoxication. Patient today admits to  sobriety.  Patient complains of worsening back pain, the last many weeks. Denies any trauma. Patient does have a history of chronic back pain. Continues to chronic cough but with mild intermittent blood. She denies any weight loss. Denies any headaches. No nausea no vomiting.  Chronic shortness of breath is not any worse.  REVIEW OF SYSTEMS:  A complete 10 point review of system is done which is negative except mentioned above/history of present illness.   PAST MEDICAL HISTORY :  Past Medical History:  Diagnosis Date  . Back pain, chronic   . Carcinoma of lung ([place]) 07/2011  . Cough   . Depression   . Dyspnea   . Emphysema of lung ([place])   . Heart murmur   . Hep C w/o coma, chronic ([place])   . Hypertension   . Lung cancer ([place])   . Swallowing difficulty     PAST SURGICAL HISTORY :   Past Surgical History:  Procedure Laterality Date  . ESOPHAGOGASTRODUODENOSCOPY N/A 06/08/2015   Procedure: ESOPHAGOGASTRODUODENOSCOPY (EGD);  Surgeon: Hulen Luster, MD;  Location: [facility] ENDOSCOPY;  Service: Gastroenterology;  Laterality: N/A;  . HEMORROIDECTOMY    . LUNG BIOPSY    . TUBAL LIGATION      FAMILY HISTORY :   Family History  Problem Relation Age of Onset  . Breast cancer Neg Hx     SOCIAL HISTORY:   Social History  Substance Use Topics  . Smoking status: Current Every Day Smoker    Packs/day: 0.25    Types: Cigarettes  . Smokeless tobacco: Never Used  . Alcohol use Yes     Comment:  beer on saturday    ALLERGIES:  is allergic to carboplatin.  MEDICATIONS:  Current Outpatient Prescriptions  Medication Sig Dispense Refill  . cyclobenzaprine (FLEXERIL) 10 MG tablet Take 10 mg by mouth at bedtime.    . Fluticasone-Salmeterol (ADVAIR DISKUS) 500-50 MCG/DOSE AEPB Inhale 1 puff into the lungs 2 (two) times daily. (Patient not taking: Reported on 12/21/2016) 1 each 3  . gabapentin (NEURONTIN) 100 MG capsule Take 1 capsule (100 mg total) by mouth 2 (two) times daily. (Patient not taking:  Reported on 09/28/2016) 90 capsule 3  . ipratropium-albuterol (DUONEB) 0.5-2.5 (3) MG/3ML SOLN Take 3 mLs by nebulization every 4 (four) hours as needed. (Patient not taking: Reported on 11/23/2016) 360 mL 3  . lidocaine-prilocaine (EMLA) cream Apply 1 application topically as needed. To port a cath site- 1 hr prior to port access (Patient not taking: Reported on 11/23/2016) 30 g 3  . ondansetron (ZOFRAN) 8 MG tablet Take 1 tablet (8 mg total) by mouth every 8 (eight) hours as needed for nausea or vomiting (start 3 days; after chemo). (Patient not taking: Reported on 09/28/2016) 40 tablet 1  . potassium chloride 20 MEQ/15ML (10%) SOLN Take 15 mLs (20 mEq total) by mouth 2 (two) times daily. (Patient not taking: Reported on 12/21/2016) 300 mL 0  . prochlorperazine (COMPAZINE) 10 MG tablet Take 1 tablet (10 mg total) by mouth every 6 (six) hours as needed for nausea or vomiting. (Patient not taking: Reported on 09/28/2016) 40 tablet 1  . traMADol (ULTRAM) 50 MG tablet Take 1 tablet (50 mg total) by mouth every 8 (eight) hours as needed. 90 tablet 0   No current facility-administered medications for this visit.    Facility-Administered Medications Ordered in Other Visits  Medication Dose Route Frequency Provider Last Rate Last Dose  . sodium chloride flush (NS) 0.9 % injection 10 mL  10 mL Intravenous PRN Cammie Sickle, MD   10 mL at 12/21/16 0927    PHYSICAL EXAMINATION: ECOG PERFORMANCE STATUS: 0 - Asymptomatic  BP 107/73 (BP Location: Right Arm, Patient Position: Sitting)   Pulse 76   Temp (!) 97.2 F (36.2 C) (Tympanic)   Resp 20   Ht 5\' 3"  (1.6 m)   Wt 114 lb 12.8 oz (52.1 kg)   BMI 20.34 kg/m   Filed Weights   12/21/16 1008  Weight: 114 lb 12.8 oz (52.1 kg)    GENERAL: Well-nourished well-developed; Alert, no distress and comfortable.   Alone.  EYES: no pallor or icterus OROPHARYNX: no thrush or ulceration; good dentition  NECK: supple, no masses felt LYMPH:  no palpable  lymphadenopathy in the cervical, axillary or inguinal regions LUNGS: Decreased air entry to auscultation bilaterally and  No wheeze or crackles HEART/CVS: regular rate & rhythm and no murmurs; No lower extremity edema ABDOMEN:abdomen soft, non-tender and normal bowel sounds Musculoskeletal:no cyanosis of digits and no clubbing  PSYCH: alert & oriented x 3 with fluent speech NEURO: no focal motor/sensory deficits SKIN:  no rashes or significant lesions  LABORATORY DATA:  I have reviewed the data as listed    Component Value Date/Time   NA 135 12/21/2016 0918   NA 135 08/29/2014 1457   K 3.3 (L) 12/21/2016 0918   K 3.7 08/29/2014 1457   CL 106 12/21/2016 0918   CL 105 08/29/2014 1457   CO2 22 12/21/2016 0918   CO2 24 08/29/2014 1457   GLUCOSE 97 12/21/2016 0918   GLUCOSE 98 08/29/2014 1457   BUN 10 12/21/2016  0918   BUN 8 08/29/2014 1457   CREATININE 0.64 12/21/2016 0918   CREATININE 0.64 08/29/2014 1457   CALCIUM 9.1 12/21/2016 0918   CALCIUM 9.0 08/29/2014 1457   PROT 8.5 (H) 12/21/2016 0918   PROT 9.3 (H) 08/29/2014 1457   ALBUMIN 4.0 12/21/2016 0918   ALBUMIN 3.6 08/29/2014 1457   AST 76 (H) 12/21/2016 0918   AST 111 (H) 08/29/2014 1457   ALT 54 12/21/2016 0918   ALT 78 (H) 08/29/2014 1457   ALKPHOS 146 (H) 12/21/2016 0918   ALKPHOS 168 (H) 08/29/2014 1457   BILITOT 0.3 12/21/2016 0918   BILITOT 0.6 08/29/2014 1457   GFRNONAA >60 12/21/2016 0918   GFRNONAA >60 08/29/2014 1457   GFRAA >60 12/21/2016 0918   GFRAA >60 08/29/2014 1457    No results found for: SPEP, UPEP  Lab Results  Component Value Date   WBC 4.0 12/21/2016   NEUTROABS 1.9 12/21/2016   HGB 11.3 (L) 12/21/2016   HCT 32.8 (L) 12/21/2016   MCV 87.5 12/21/2016   PLT 377 12/21/2016      Chemistry      Component Value Date/Time   NA 135 12/21/2016 0918   NA 135 08/29/2014 1457   K 3.3 (L) 12/21/2016 0918   K 3.7 08/29/2014 1457   CL 106 12/21/2016 0918   CL 105 08/29/2014 1457   CO2 22  12/21/2016 0918   CO2 24 08/29/2014 1457   BUN 10 12/21/2016 0918   BUN 8 08/29/2014 1457   CREATININE 0.64 12/21/2016 0918   CREATININE 0.64 08/29/2014 1457      Component Value Date/Time   CALCIUM 9.1 12/21/2016 0918   CALCIUM 9.0 08/29/2014 1457   ALKPHOS 146 (H) 12/21/2016 0918   ALKPHOS 168 (H) 08/29/2014 1457   AST 76 (H) 12/21/2016 0918   AST 111 (H) 08/29/2014 1457   ALT 54 12/21/2016 0918   ALT 78 (H) 08/29/2014 1457   BILITOT 0.3 12/21/2016 0918   BILITOT 0.6 08/29/2014 1457      IMPRESSION: 1. Mild Interval increase in size of RIGHT upper lobe pulmonary nodule. 2. Mild interval increase in size of RIGHT lower lobe pulmonary nodule. 3. Stable post radiation change in the RIGHT upper lobe and RIGHT lower lobe.   Electronically Signed   By: Suzy Bouchard M.D.   On: 05/20/2016 12:10  ------------------------------------------------------------------------------------------    IMPRESSION: 1. Interval decrease in size of right upper lobe pulmonary nodule. 2. Previously described right lower lobe pulmonary nodule has decreased in the interval. 3. Irregular lesion posterior right lower lobe described previously as post radiation change is stable. 4. Right upper lobe fibrosis similar to prior.   Electronically Signed   By: Misty Stanley M.D.   On: 08/13/2016 14:25 RADIOGRAPHIC STUDIES: I have personally reviewed the radiological images as listed and agreed with the findings in the report. No results found.   ASSESSMENT & PLAN:  Primary cancer of bronchus of right lower lobe ([place]) # Right lung ca- with recurrence [based on imaging]- metastases to right lung ~10-32m;  July 5th 2018- progression of the right lower lobe primary mass; and also the right upper lobe and the right medial lobe lesions. Currently on gemcitabine single agent.   # Chemotherapy was held Last visit because of alcohol intoxication.   # Proceed with gemcitabine single agent  chemotherapy today. Labs today reviewed;  acceptable for treatment today.   # Alcohol intoxication- discussed with the patient that alcohol can interfere with chemotherapy; and also  cause liver dysfunction. Recommend abstinence.   # low back pain- check bone scan.  Refill tramadol every 8 hours.   # COPD/bronchitis-STABLE/improved.   # Elevated alkaline phos- 146; LFTs- slightly abnorma sec to alcohol. monitor for now.  # follow up in 3 weeks/chemo/labs; Bone scan prior; 1 week;labs. Bone scan prior ASAP.    Orders Placed This Encounter  Procedures  . NM Bone Scan Whole Body    Standing Status:   Future    Standing Expiration Date:   12/21/2017    Order Specific Question:   If indicated for the ordered procedure, I authorize the administration of a radiopharmaceutical per Radiology protocol    Answer:   Yes    Order Specific Question:   Is the patient pregnant?    Answer:   No    Order Specific Question:   Preferred imaging location?    Answer:   [facility]    Order Specific Question:   Radiology Contrast Protocol - do NOT remove file path    Answer:   [email or web address]      Cammie Sickle, MD 12/21/2016 2:06 PM

## 2016-12-21 NOTE — Assessment & Plan Note (Addendum)
#   Right lung ca- with recurrence [based on imaging]- metastases to right lung ~10-31m;  July 5th 2018- progression of the right lower lobe primary mass; and also the right upper lobe and the right medial lobe lesions. Currently on gemcitabine single agent.   # Chemotherapy was held Last visit because of alcohol intoxication.   # Proceed with gemcitabine single agent chemotherapy today. Labs today reviewed;  acceptable for treatment today.   # Alcohol intoxication- discussed with the patient that alcohol can interfere with chemotherapy; and also cause liver dysfunction. Recommend abstinence.   # low back pain- check bone scan.  Refill tramadol every 8 hours.   # COPD/bronchitis-STABLE/improved.   # Elevated alkaline phos- 146; LFTs- slightly abnorma sec to alcohol. monitor for now.  # follow up in 3 weeks/chemo/labs; Bone scan prior; 1 week;labs. Bone scan prior ASAP.

## 2016-12-28 ENCOUNTER — Inpatient Hospital Stay: Payer: Medicare Other

## 2016-12-28 VITALS — BP 109/69 | HR 86 | Temp 96.5°F | Resp 20

## 2016-12-28 DIAGNOSIS — Z5111 Encounter for antineoplastic chemotherapy: Secondary | ICD-10-CM | POA: Diagnosis not present

## 2016-12-28 DIAGNOSIS — C3431 Malignant neoplasm of lower lobe, right bronchus or lung: Secondary | ICD-10-CM

## 2016-12-28 MED ORDER — PROCHLORPERAZINE MALEATE 10 MG PO TABS
10.0000 mg | ORAL_TABLET | Freq: Once | ORAL | Status: AC
  Administered 2016-12-28: 10 mg via ORAL
  Filled 2016-12-28: qty 1

## 2016-12-28 MED ORDER — SODIUM CHLORIDE 0.9% FLUSH
10.0000 mL | INTRAVENOUS | Status: DC | PRN
  Administered 2016-12-28: 10 mL
  Filled 2016-12-28: qty 10

## 2016-12-28 MED ORDER — SODIUM CHLORIDE 0.9 % IV SOLN
Freq: Once | INTRAVENOUS | Status: AC
Start: 2016-12-28 — End: 2016-12-28
  Administered 2016-12-28: 10:00:00 via INTRAVENOUS
  Filled 2016-12-28: qty 1000

## 2016-12-28 MED ORDER — SODIUM CHLORIDE 0.9 % IV SOLN
1600.0000 mg | Freq: Once | INTRAVENOUS | Status: AC
  Administered 2016-12-28: 1600 mg via INTRAVENOUS
  Filled 2016-12-28: qty 26.3

## 2016-12-28 MED ORDER — HEPARIN SOD (PORK) LOCK FLUSH 100 UNIT/ML IV SOLN
500.0000 [IU] | Freq: Once | INTRAVENOUS | Status: AC | PRN
  Administered 2016-12-28: 500 [IU]
  Filled 2016-12-28: qty 5

## 2017-01-07 ENCOUNTER — Ambulatory Visit
Admission: RE | Admit: 2017-01-07 | Discharge: 2017-01-07 | Disposition: A | Discharge status: Home / Self Care | Payer: Medicare Other | Source: Ambulatory Visit | Attending: Radiation Oncology | Admitting: Radiation Oncology

## 2017-01-07 VITALS — BP 140/88 | HR 84 | Temp 98.0°F | Resp 16 | Ht 63.0 in | Wt 112.4 lb

## 2017-01-07 DIAGNOSIS — M549 Dorsalgia, unspecified: Secondary | ICD-10-CM | POA: Diagnosis not present

## 2017-01-07 DIAGNOSIS — Z9221 Personal history of antineoplastic chemotherapy: Secondary | ICD-10-CM | POA: Insufficient documentation

## 2017-01-07 DIAGNOSIS — C3491 Malignant neoplasm of unspecified part of right bronchus or lung: Secondary | ICD-10-CM

## 2017-01-07 DIAGNOSIS — R918 Other nonspecific abnormal finding of lung field: Secondary | ICD-10-CM | POA: Diagnosis not present

## 2017-01-07 DIAGNOSIS — C3431 Malignant neoplasm of lower lobe, right bronchus or lung: Secondary | ICD-10-CM | POA: Diagnosis present

## 2017-01-07 DIAGNOSIS — F17210 Nicotine dependence, cigarettes, uncomplicated: Secondary | ICD-10-CM | POA: Diagnosis not present

## 2017-01-07 DIAGNOSIS — Z923 Personal history of irradiation: Secondary | ICD-10-CM | POA: Diagnosis not present

## 2017-01-07 NOTE — Progress Notes (Signed)
Radiation Oncology Follow up Note  Name: Jeanette Jones   Date:   01/07/2017 MRN:  [id number] DOB: [birth year]    This 61 y.o. female presents to the clinic today for follow-up for patient with known stage IV lung cancer previously treated back in 2013 for stage IIIB disease of the lung  REFERRING PROVIDER: [facility], [facility]  HPI: Patient is a 61 year old female presented treated back in 2013 with concurrent chemoradiation for stage IIIB (T4 N3 M0) non-small cell lung cancer. We've been following her for 2 right lung nodules and was placed on immunotherapy treatments back in November 2017.. I have reevaluated her as she is currently on immunotherapy withOPDIVO q 2W . She is since had multiple chemotherapy interventions including gemcitabine and carbotaxol. She's been having some increasing back pain both PET CT scan and bone scan have been ordered not yet performed. Currently her right upper lobe pulmonary nodule has decreased in size. Her most recent CT scan performed 11/19/2016 shows some progression in a 5 cm mass in the middle right lower lobe. She also has a 2 cm right upper lobe lesion increased in size.  COMPLICATIONS OF TREATMENT: none  FOLLOW UP COMPLIANCE: keeps appointments   PHYSICAL EXAM:  BP 140/88 (BP Location: Right Arm, Patient Position: Sitting, Cuff Size: Small)   Pulse 84   Temp 98 F (36.7 C) (Tympanic)   Resp 16   Ht 5\' 3"  (1.6 m)   Wt 112 lb 7 oz (51 kg)   BMI 19.92 kg/m  Thin slightly cachectic female in NAD. Well-developed well-nourished patient in NAD. HEENT reveals PERLA, EOMI, discs not visualized.  Oral cavity is clear. No oral mucosal lesions are identified. Neck is clear without evidence of cervical or supraclavicular adenopathy. Lungs are clear to A&P. Cardiac examination is essentially unremarkable with regular rate and rhythm without murmur rub or thrill. Abdomen is benign with no organomegaly or masses noted. Motor sensory and DTR levels are equal  and symmetric in the upper and lower extremities. Cranial nerves II through XII are grossly intact. Proprioception is intact. No peripheral adenopathy or edema is identified. No motor or sensory levels are noted. Crude visual fields are within normal range.  RADIOLOGY RESULTS: CT scan from July is reviewed. Anticipate reviewing both her bone scan and PET CT scan when available  PLAN: At this time like to review her bone scan and PET/CT scan for determination should we go ahead with further radiation therapy to her progressive disease in her chest or possible bone metastasis. All this was explained in detail to the patient. I've set up a follow-up appointment in 3 weeks when I can best review her new current studies PET CT and bone scan. Also will discuss the case with medical oncology and make further determinations on palliative treatment. Patient is aware of my treatment plan.  I would like to take this opportunity to thank you for allowing me to participate in the care of your patient.Armstead Peaks., MD

## 2017-01-07 NOTE — Progress Notes (Signed)
Patient here for follow up. She is currently receiving chemo therapy.

## 2017-01-08 ENCOUNTER — Encounter
Admission: RE | Admit: 2017-01-08 | Discharge: 2017-01-08 | Disposition: A | Discharge status: Home / Self Care | Payer: Medicare Other | Source: Ambulatory Visit | Attending: Internal Medicine | Admitting: Internal Medicine

## 2017-01-08 ENCOUNTER — Encounter: Admission: RE | Admit: 2017-01-08 | Payer: Medicare Other | Source: Ambulatory Visit

## 2017-01-08 ENCOUNTER — Other Ambulatory Visit: Payer: Self-pay | Admitting: Internal Medicine

## 2017-01-08 DIAGNOSIS — C3431 Malignant neoplasm of lower lobe, right bronchus or lung: Secondary | ICD-10-CM | POA: Insufficient documentation

## 2017-01-08 MED ORDER — TECHNETIUM TC 99M MEDRONATE IV KIT
25.0000 | PACK | Freq: Once | INTRAVENOUS | Status: AC | PRN
  Administered 2017-01-08: 21.86 via INTRAVENOUS

## 2017-01-11 ENCOUNTER — Encounter: Payer: Self-pay | Admitting: Internal Medicine

## 2017-01-11 ENCOUNTER — Inpatient Hospital Stay: Payer: Medicare Other

## 2017-01-11 ENCOUNTER — Ambulatory Visit
Admission: RE | Admit: 2017-01-11 | Discharge: 2017-01-11 | Disposition: A | Discharge status: Home / Self Care | Payer: Medicare Other | Source: Ambulatory Visit | Attending: Internal Medicine | Admitting: Internal Medicine

## 2017-01-11 ENCOUNTER — Inpatient Hospital Stay (HOSPITAL_BASED_OUTPATIENT_CLINIC_OR_DEPARTMENT_OTHER): Payer: Medicare Other | Admitting: Internal Medicine

## 2017-01-11 VITALS — BP 133/84 | HR 76 | Temp 97.6°F | Resp 18 | Ht 63.0 in | Wt 113.6 lb

## 2017-01-11 DIAGNOSIS — Z5111 Encounter for antineoplastic chemotherapy: Secondary | ICD-10-CM | POA: Diagnosis not present

## 2017-01-11 DIAGNOSIS — M25562 Pain in left knee: Secondary | ICD-10-CM

## 2017-01-11 DIAGNOSIS — M25561 Pain in right knee: Secondary | ICD-10-CM

## 2017-01-11 DIAGNOSIS — M5410 Radiculopathy, site unspecified: Secondary | ICD-10-CM

## 2017-01-11 DIAGNOSIS — R748 Abnormal levels of other serum enzymes: Secondary | ICD-10-CM | POA: Diagnosis not present

## 2017-01-11 DIAGNOSIS — G8929 Other chronic pain: Secondary | ICD-10-CM | POA: Diagnosis not present

## 2017-01-11 DIAGNOSIS — Z79899 Other long term (current) drug therapy: Secondary | ICD-10-CM

## 2017-01-11 DIAGNOSIS — C3431 Malignant neoplasm of lower lobe, right bronchus or lung: Secondary | ICD-10-CM | POA: Insufficient documentation

## 2017-01-11 DIAGNOSIS — M545 Low back pain: Secondary | ICD-10-CM | POA: Diagnosis not present

## 2017-01-11 DIAGNOSIS — F17210 Nicotine dependence, cigarettes, uncomplicated: Secondary | ICD-10-CM

## 2017-01-11 DIAGNOSIS — R938 Abnormal findings on diagnostic imaging of other specified body structures: Secondary | ICD-10-CM | POA: Insufficient documentation

## 2017-01-11 DIAGNOSIS — C7801 Secondary malignant neoplasm of right lung: Secondary | ICD-10-CM

## 2017-01-11 DIAGNOSIS — M1711 Unilateral primary osteoarthritis, right knee: Secondary | ICD-10-CM | POA: Diagnosis not present

## 2017-01-11 DIAGNOSIS — J449 Chronic obstructive pulmonary disease, unspecified: Secondary | ICD-10-CM | POA: Diagnosis not present

## 2017-01-11 LAB — CBC WITH DIFFERENTIAL/PLATELET
BASOS ABS: 0 10*3/uL (ref 0–0.1)
BASOS PCT: 1 %
Eosinophils Absolute: 0.1 10*3/uL (ref 0–0.7)
Eosinophils Relative: 2 %
HEMATOCRIT: 28.7 % — AB (ref 35.0–47.0)
HEMOGLOBIN: 10.2 g/dL — AB (ref 12.0–16.0)
LYMPHS PCT: 34 %
Lymphs Abs: 1.3 10*3/uL (ref 1.0–3.6)
MCH: 32.4 pg (ref 26.0–34.0)
MCHC: 35.4 g/dL (ref 32.0–36.0)
MCV: 91.6 fL (ref 80.0–100.0)
Monocytes Absolute: 0.7 10*3/uL (ref 0.2–0.9)
Monocytes Relative: 17 %
NEUTROS ABS: 1.8 10*3/uL (ref 1.4–6.5)
NEUTROS PCT: 46 %
Platelets: 526 10*3/uL — ABNORMAL HIGH (ref 150–440)
RBC: 3.14 MIL/uL — AB (ref 3.80–5.20)
RDW: 27 % — AB (ref 11.5–14.5)
WBC: 4 10*3/uL (ref 3.6–11.0)

## 2017-01-11 LAB — COMPREHENSIVE METABOLIC PANEL
ALBUMIN: 3.5 g/dL (ref 3.5–5.0)
ALT: 61 U/L — AB (ref 14–54)
ANION GAP: 6 (ref 5–15)
AST: 83 U/L — ABNORMAL HIGH (ref 15–41)
Alkaline Phosphatase: 172 U/L — ABNORMAL HIGH (ref 38–126)
BUN: 9 mg/dL (ref 6–20)
CHLORIDE: 108 mmol/L (ref 101–111)
CO2: 23 mmol/L (ref 22–32)
CREATININE: 0.56 mg/dL (ref 0.44–1.00)
Calcium: 8.8 mg/dL — ABNORMAL LOW (ref 8.9–10.3)
Glucose, Bld: 82 mg/dL (ref 65–99)
POTASSIUM: 3.7 mmol/L (ref 3.5–5.1)
Sodium: 137 mmol/L (ref 135–145)
Total Bilirubin: 0.3 mg/dL (ref 0.3–1.2)
Total Protein: 8.6 g/dL — ABNORMAL HIGH (ref 6.5–8.1)

## 2017-01-11 MED ORDER — SODIUM CHLORIDE 0.9 % IV SOLN
1600.0000 mg | Freq: Once | INTRAVENOUS | Status: AC
  Administered 2017-01-11: 1600 mg via INTRAVENOUS
  Filled 2017-01-11: qty 26.3

## 2017-01-11 MED ORDER — SODIUM CHLORIDE 0.9 % IV SOLN
Freq: Once | INTRAVENOUS | Status: AC
  Administered 2017-01-11: 10:00:00 via INTRAVENOUS
  Filled 2017-01-11: qty 1000

## 2017-01-11 MED ORDER — PROCHLORPERAZINE MALEATE 10 MG PO TABS
10.0000 mg | ORAL_TABLET | Freq: Once | ORAL | Status: AC
Start: 2017-01-11 — End: 2017-01-11
  Administered 2017-01-11: 10 mg via ORAL
  Filled 2017-01-11: qty 1

## 2017-01-11 MED ORDER — HEPARIN SOD (PORK) LOCK FLUSH 100 UNIT/ML IV SOLN
500.0000 [IU] | Freq: Once | INTRAVENOUS | Status: AC
  Administered 2017-01-11: 500 [IU] via INTRAVENOUS

## 2017-01-11 MED ORDER — TRAMADOL HCL 50 MG PO TABS: 50.0000 mg | ORAL_TABLET | Freq: Three times a day (TID) | ORAL | 0 refills | Status: DC | PRN

## 2017-01-11 MED ORDER — HEPARIN SOD (PORK) LOCK FLUSH 100 UNIT/ML IV SOLN
500.0000 [IU] | Freq: Once | INTRAVENOUS | Status: DC | PRN
  Filled 2017-01-11: qty 5

## 2017-01-11 MED ORDER — SODIUM CHLORIDE 0.9% FLUSH
10.0000 mL | INTRAVENOUS | Status: DC | PRN
Start: 2017-01-11 — End: 2017-01-11
  Administered 2017-01-11: 10 mL via INTRAVENOUS
  Filled 2017-01-11: qty 10

## 2017-01-11 NOTE — Assessment & Plan Note (Addendum)
#   Right lung ca- with recurrence [based on imaging]- metastases to right lung ~10-74m;  July 5th 2018- progression of the right lower lobe primary mass; and also the right upper lobe and the right medial lobe lesions. Currently on gemcitabine single agent.   # Proceed with gemcitabine single agent chemotherapy today. Labs today reviewed;  acceptable for treatment today.   # Alcohol intoxication- discussed with the patient that alcohol can interfere with chemotherapy; and also cause liver dysfunction. Recommend abstinence.  # Bone pain- ? Back/bil knee pain- recommend tramadol; ? HPOA. Check X-rays today.   # COPD/bronchitis-STABLE/improved.   # Elevated alkaline phos- 171; LFTs- slightly abnormal sec to alcohol. monitor for now.  # follow up in 3 weeks/labs; 1 week/lab-chemo; PET prior to next visit for any role for possible radiation.

## 2017-01-11 NOTE — Progress Notes (Signed)
[facility] OFFICE PROGRESS NOTE  Patient Care Team: Center, [facility] as PCP - General (General Practice)  Cancer Staging No matching staging information was found for the patient.   Oncology History   Chief Complaint/Diagnosis:   1. 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 Faith Rogue, cardiothoracic surgeon) 2. Started on radiation and concurrent chemotherapy with carboplatinum Taxol from April of 2013 3. Finished radiation and chemotherapy with carboplatinum and Taxol in May of 2013 4. Stage IIIB (T4N3M0) NSCLC who completed a course of EBRT on Oct 07, 2011 with a total dose of 7000cGy.   5. Starting consolidation with 2 cycles of carboplatinum and Abraxane 6.patient is off chemotherapy since November of 2013.  Repeat CT scan shows stable disease(January, 2014) 7  Started been on Taxotere and  RAMICIRUMAB from June 07, 2013 8.chemotherapy with Taxotere  and  RAMUCIRUMAB discontinued in March of 2015 because of hemoptysis.  9.Started onNIVOLULAMAB April of 2015. 10NIVOLULAMABWas put on hold because of abnormal liver enzymes January of 2016  # AUG 2017- Right lung recurrence [2 nodules - ~1cm; stable lung mass/fibrosis]. START OPDIVO q 2W [small to Bx; s/p rad-onc eval]; opdivo x4- CT OCT 16th- PROGRESSION 2 lung nodules ~40mm;   # JAN 2018-carbo-TAXOL s/p 2 cycles- CT- PR; but carbo-anaphylaxis; STOPPED chemo  # July 2018- PROGRESSION- GEMCITABINE  # MOLECULAR TESTING- ? Not enough tissue.      Primary cancer of bronchus of right lower lobe (HCC)     INTERVAL HISTORY:  Jeanette Jones 61 y.o.  female pleasant patient above history of stage IV lung cancer squamous cell/ recurrent- Most recently on gemcitabine single agent.  In the interim patient was evaluated by radiation oncology for follow-up for any local radiation.   Patient continues to  complain of chronic back pain bilaterally pain. Denies any trauma.   Continues to chronic cough but with mild intermittent blood. She denies any weight loss. Denies any headaches. No nausea no vomiting.  Chronic shortness of breath is not any worse.  REVIEW OF SYSTEMS:  A complete 10 point review of system is done which is negative except mentioned above/history of present illness.   PAST MEDICAL HISTORY :  Past Medical History:  Diagnosis Date  . Back pain, chronic   . Carcinoma of lung ([place]) 07/2011  . Cough   . Depression   . Dyspnea   . Emphysema of lung ([place])   . Heart murmur   . Hep C w/o coma, chronic ([place])   . Hypertension   . Lung cancer ([place])   . Swallowing difficulty     PAST SURGICAL HISTORY :   Past Surgical History:  Procedure Laterality Date  . ESOPHAGOGASTRODUODENOSCOPY N/A 06/08/2015   Procedure: ESOPHAGOGASTRODUODENOSCOPY (EGD);  Surgeon: Hulen Luster, MD;  Location: [facility] ENDOSCOPY;  Service: Gastroenterology;  Laterality: N/A;  . HEMORROIDECTOMY    . LUNG BIOPSY    . TUBAL LIGATION      FAMILY HISTORY :   Family History  Problem Relation Age of Onset  . Breast cancer Neg Hx     SOCIAL HISTORY:   Social History  Substance Use Topics  . Smoking status: Current Every Day Smoker    Packs/day: 0.25    Years: 40.00    Types: Cigarettes  . Smokeless tobacco: Never Used  . Alcohol use Yes     Comment: beer on saturday  ALLERGIES:  is allergic to carboplatin.  MEDICATIONS:  Current Outpatient Prescriptions  Medication Sig Dispense Refill  . traMADol (ULTRAM) 50 MG tablet Take 1 tablet (50 mg total) by mouth every 8 (eight) hours as needed. 90 tablet 0  . cyclobenzaprine (FLEXERIL) 10 MG tablet Take 10 mg by mouth at bedtime.    . Fluticasone-Salmeterol (ADVAIR DISKUS) 500-50 MCG/DOSE AEPB Inhale 1 puff into the lungs 2 (two) times daily. 1 each 3  . gabapentin (NEURONTIN) 100 MG capsule Take 1 capsule (100 mg total) by mouth 2 (two) times daily. 90  capsule 3  . ipratropium-albuterol (DUONEB) 0.5-2.5 (3) MG/3ML SOLN Take 3 mLs by nebulization every 4 (four) hours as needed. (Patient not taking: Reported on 01/11/2017) 360 mL 3  . lidocaine-prilocaine (EMLA) cream Apply 1 application topically as needed. To port a cath site- 1 hr prior to port access (Patient not taking: Reported on 01/11/2017) 30 g 3  . ondansetron (ZOFRAN) 8 MG tablet Take 1 tablet (8 mg total) by mouth every 8 (eight) hours as needed for nausea or vomiting (start 3 days; after chemo). (Patient not taking: Reported on 01/11/2017) 40 tablet 1  . potassium chloride 20 MEQ/15ML (10%) SOLN Take 15 mLs (20 mEq total) by mouth 2 (two) times daily. (Patient not taking: Reported on 01/11/2017) 300 mL 0  . prochlorperazine (COMPAZINE) 10 MG tablet Take 1 tablet (10 mg total) by mouth every 6 (six) hours as needed for nausea or vomiting. (Patient not taking: Reported on 01/11/2017) 40 tablet 1   No current facility-administered medications for this visit.     PHYSICAL EXAMINATION: ECOG PERFORMANCE STATUS: 0 - Asymptomatic  BP 133/84 (Patient Position: Sitting)   Pulse 76   Temp 97.6 F (36.4 C) (Tympanic)   Resp 18   Ht 5\' 3"  (1.6 m)   Wt 113 lb 9.6 oz (51.5 kg)   BMI 20.12 kg/m   Filed Weights   01/11/17 0908  Weight: 113 lb 9.6 oz (51.5 kg)    GENERAL: Well-nourished well-developed; Alert, no distress and comfortable.   Alone.  EYES: no pallor or icterus OROPHARYNX: no thrush or ulceration; good dentition  NECK: supple, no masses felt LYMPH:  no palpable lymphadenopathy in the cervical, axillary or inguinal regions LUNGS: Decreased air entry to auscultation bilaterally and  No wheeze or crackles HEART/CVS: regular rate & rhythm and no murmurs; No lower extremity edema ABDOMEN:abdomen soft, non-tender and normal bowel sounds Musculoskeletal:no cyanosis of digits and no clubbing  PSYCH: alert & oriented x 3 with fluent speech NEURO: no focal motor/sensory deficits SKIN:   no rashes or significant lesions  LABORATORY DATA:  I have reviewed the data as listed    Component Value Date/Time   NA 137 01/11/2017 0852   NA 135 08/29/2014 1457   K 3.7 01/11/2017 0852   K 3.7 08/29/2014 1457   CL 108 01/11/2017 0852   CL 105 08/29/2014 1457   CO2 23 01/11/2017 0852   CO2 24 08/29/2014 1457   GLUCOSE 82 01/11/2017 0852   GLUCOSE 98 08/29/2014 1457   BUN 9 01/11/2017 0852   BUN 8 08/29/2014 1457   CREATININE 0.56 01/11/2017 0852   CREATININE 0.64 08/29/2014 1457   CALCIUM 8.8 (L) 01/11/2017 0852   CALCIUM 9.0 08/29/2014 1457   PROT 8.6 (H) 01/11/2017 0852   PROT 9.3 (H) 08/29/2014 1457   ALBUMIN 3.5 01/11/2017 0852   ALBUMIN 3.6 08/29/2014 1457   AST 83 (H) 01/11/2017 0852   AST 111 (  H) 08/29/2014 1457   ALT 61 (H) 01/11/2017 0852   ALT 78 (H) 08/29/2014 1457   ALKPHOS 172 (H) 01/11/2017 0852   ALKPHOS 168 (H) 08/29/2014 1457   BILITOT 0.3 01/11/2017 0852   BILITOT 0.6 08/29/2014 1457   GFRNONAA >60 01/11/2017 0852   GFRNONAA >60 08/29/2014 1457   GFRAA >60 01/11/2017 0852   GFRAA >60 08/29/2014 1457    No results found for: SPEP, UPEP  Lab Results  Component Value Date   WBC 4.0 01/11/2017   NEUTROABS 1.8 01/11/2017   HGB 10.2 (L) 01/11/2017   HCT 28.7 (L) 01/11/2017   MCV 91.6 01/11/2017   PLT 526 (H) 01/11/2017      Chemistry      Component Value Date/Time   NA 137 01/11/2017 0852   NA 135 08/29/2014 1457   K 3.7 01/11/2017 0852   K 3.7 08/29/2014 1457   CL 108 01/11/2017 0852   CL 105 08/29/2014 1457   CO2 23 01/11/2017 0852   CO2 24 08/29/2014 1457   BUN 9 01/11/2017 0852   BUN 8 08/29/2014 1457   CREATININE 0.56 01/11/2017 0852   CREATININE 0.64 08/29/2014 1457      Component Value Date/Time   CALCIUM 8.8 (L) 01/11/2017 0852   CALCIUM 9.0 08/29/2014 1457   ALKPHOS 172 (H) 01/11/2017 0852   ALKPHOS 168 (H) 08/29/2014 1457   AST 83 (H) 01/11/2017 0852   AST 111 (H) 08/29/2014 1457   ALT 61 (H) 01/11/2017 0852   ALT  78 (H) 08/29/2014 1457   BILITOT 0.3 01/11/2017 0852   BILITOT 0.6 08/29/2014 1457      IMPRESSION: 1. No evidence of osseous metastases. 2. Focal increased radiotracer uptake about both knees, right greater than left, most suggestive of degenerative change. 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 Recommend correlation with plain films of the bilateral knees and bilateral tibia-fibula.   Electronically Signed   By: Franki Cabot M.D.   On: 01/08/2017 14:54    RADIOGRAPHIC STUDIES: I have personally reviewed the radiological images as listed and agreed with the findings in the report. No results found.   ASSESSMENT & PLAN:  Primary cancer of bronchus of right lower lobe ([place]) # Right lung ca- with recurrence [based on imaging]- metastases to right lung ~10-20m;  July 5th 2018- progression of the right lower lobe primary mass; and also the right upper lobe and the right medial lobe lesions. Currently on gemcitabine single agent.   # Proceed with gemcitabine single agent chemotherapy today. Labs today reviewed;  acceptable for treatment today.   # Alcohol intoxication- discussed with the patient that alcohol can interfere with chemotherapy; and also cause liver dysfunction. Recommend abstinence.  # Bone pain- ? Back/bil knee pain- recommend tramadol; ? HPOA. Check X-rays today.   # COPD/bronchitis-STABLE/improved.   # Elevated alkaline phos- 171; LFTs- slightly abnormal sec to alcohol. monitor for now.  # follow up in 3 weeks/labs; 1 week/lab-chemo; PET prior to next visit for any role for possible radiation.    Orders Placed This Encounter  Procedures  . NM PET Image Restag (PS) Skull Base To Thigh    Standing Status:   Future    Standing Expiration Date:   01/11/2018    Order Specific Question:   If indicated for the ordered procedure, I  authorize the administration of a radiopharmaceutical per Radiology protocol    Answer:  Yes    Order Specific Question:   Is the patient pregnant?    Answer:   No    Order Specific Question:   Preferred imaging location?    Answer:   [facility]    Order Specific Question:   Radiology Contrast Protocol - do NOT remove file path    Answer:   \\[email or web address]\[email or web address]    Order Specific Question:   Reason for Exam additional comments    Answer:   lung cancer; re-staging  . DG Knee 1-2 Views Left    Standing Status:   Future    Number of Occurrences:   1    Standing Expiration Date:   03/13/2018    Order Specific Question:   Reason for Exam (SYMPTOM  OR DIAGNOSIS REQUIRED)    Answer:   knee pain    Order Specific Question:   Is patient pregnant?    Answer:   No    Order Specific Question:   Preferred imaging location?    Answer:   [facility]    Order Specific Question:   Radiology Contrast Protocol - do NOT remove file path    Answer:   \\[email or web address]\Radiant\DXFluoroContrastProtocols.pdf  . DG Knee 1-2 Views Right    Standing Status:   Future    Number of Occurrences:   1    Standing Expiration Date:   03/13/2018    Order Specific Question:   Reason for Exam (SYMPTOM  OR DIAGNOSIS REQUIRED)    Answer:   bilateral knee pain    Order Specific Question:   Is patient pregnant?    Answer:   No    Order Specific Question:   Preferred imaging location?    Answer:   [facility]    Order Specific Question:   Radiology Contrast Protocol - do NOT remove file path    Answer:   \\[email or web address]\Radiant\DXFluoroContrastProtocols.pdf  . DG Tibia/Fibula Left    Standing Status:   Future    Number of Occurrences:   1    Standing Expiration Date:   03/13/2018    Order Specific Question:   Reason for Exam (SYMPTOM  OR DIAGNOSIS REQUIRED)    Answer:   bilateral lower extremity leg pain    Order Specific Question:   Is patient pregnant?    Answer:    No    Order Specific Question:   Preferred imaging location?    Answer:   [facility]    Order Specific Question:   Radiology Contrast Protocol - do NOT remove file path    Answer:   \\[email or web address]\Radiant\DXFluoroContrastProtocols.pdf  . DG Tibia/Fibula Right    Standing Status:   Future    Number of Occurrences:   1    Standing Expiration Date:   03/13/2018    Order Specific Question:   Reason for Exam (SYMPTOM  OR DIAGNOSIS REQUIRED)    Answer:   bilateral lower extremity leg pain    Order Specific Question:   Is patient pregnant?    Answer:   No    Order Specific Question:   Preferred imaging location?    Answer:   [facility]    Order Specific Question:   Radiology Contrast Protocol - do NOT remove file path    Answer:   \\[email or web address]\Radiant\DXFluoroContrastProtocols.pdf      Cammie Sickle, MD 01/18/2017 8:00 PM

## 2017-01-11 NOTE — Progress Notes (Signed)
Patient here for follow-up for lung cancer and chemotherapy. She reports daily nosebleed-dark red. Light to moderate nosebleed.  Patient reports that she is "drinking 40 ounces of beer per day." pt states that she is "smoking weed to help with nausea and weight loss and decrease appetite."

## 2017-01-14 ENCOUNTER — Encounter: Payer: Self-pay | Admitting: *Deleted

## 2017-01-19 ENCOUNTER — Other Ambulatory Visit: Payer: Self-pay | Admitting: *Deleted

## 2017-01-19 ENCOUNTER — Inpatient Hospital Stay: Payer: Medicare Other | Attending: Internal Medicine

## 2017-01-19 ENCOUNTER — Inpatient Hospital Stay: Payer: Medicare Other

## 2017-01-19 DIAGNOSIS — G8929 Other chronic pain: Secondary | ICD-10-CM | POA: Insufficient documentation

## 2017-01-19 DIAGNOSIS — C7801 Secondary malignant neoplasm of right lung: Secondary | ICD-10-CM | POA: Insufficient documentation

## 2017-01-19 DIAGNOSIS — Z5111 Encounter for antineoplastic chemotherapy: Secondary | ICD-10-CM | POA: Insufficient documentation

## 2017-01-19 DIAGNOSIS — Z79899 Other long term (current) drug therapy: Secondary | ICD-10-CM | POA: Insufficient documentation

## 2017-01-19 DIAGNOSIS — Z85118 Personal history of other malignant neoplasm of bronchus and lung: Secondary | ICD-10-CM

## 2017-01-19 DIAGNOSIS — J449 Chronic obstructive pulmonary disease, unspecified: Secondary | ICD-10-CM | POA: Diagnosis not present

## 2017-01-19 DIAGNOSIS — R042 Hemoptysis: Secondary | ICD-10-CM | POA: Diagnosis not present

## 2017-01-19 DIAGNOSIS — F17210 Nicotine dependence, cigarettes, uncomplicated: Secondary | ICD-10-CM | POA: Diagnosis not present

## 2017-01-19 DIAGNOSIS — M549 Dorsalgia, unspecified: Secondary | ICD-10-CM | POA: Diagnosis not present

## 2017-01-19 DIAGNOSIS — Z9221 Personal history of antineoplastic chemotherapy: Secondary | ICD-10-CM | POA: Insufficient documentation

## 2017-01-19 DIAGNOSIS — F329 Major depressive disorder, single episode, unspecified: Secondary | ICD-10-CM | POA: Diagnosis not present

## 2017-01-19 DIAGNOSIS — I10 Essential (primary) hypertension: Secondary | ICD-10-CM | POA: Insufficient documentation

## 2017-01-19 DIAGNOSIS — F10129 Alcohol abuse with intoxication, unspecified: Secondary | ICD-10-CM | POA: Diagnosis not present

## 2017-01-19 DIAGNOSIS — C3431 Malignant neoplasm of lower lobe, right bronchus or lung: Secondary | ICD-10-CM

## 2017-01-19 DIAGNOSIS — R748 Abnormal levels of other serum enzymes: Secondary | ICD-10-CM | POA: Insufficient documentation

## 2017-01-19 DIAGNOSIS — B182 Chronic viral hepatitis C: Secondary | ICD-10-CM | POA: Insufficient documentation

## 2017-01-19 DIAGNOSIS — R05 Cough: Secondary | ICD-10-CM | POA: Diagnosis not present

## 2017-01-19 DIAGNOSIS — Z923 Personal history of irradiation: Secondary | ICD-10-CM | POA: Diagnosis not present

## 2017-01-19 LAB — CBC WITH DIFFERENTIAL/PLATELET
BASOS ABS: 0 10*3/uL (ref 0–0.1)
BASOS PCT: 1 %
EOS ABS: 0 10*3/uL (ref 0–0.7)
Eosinophils Relative: 0 %
HCT: 30 % — ABNORMAL LOW (ref 35.0–47.0)
HEMOGLOBIN: 10.4 g/dL — AB (ref 12.0–16.0)
Lymphocytes Relative: 52 %
Lymphs Abs: 1.2 10*3/uL (ref 1.0–3.6)
MCH: 32 pg (ref 26.0–34.0)
MCHC: 34.8 g/dL (ref 32.0–36.0)
MCV: 92 fL (ref 80.0–100.0)
MONO ABS: 0.4 10*3/uL (ref 0.2–0.9)
Monocytes Relative: 17 %
NEUTROS ABS: 0.7 10*3/uL — AB (ref 1.4–6.5)
Neutrophils Relative %: 30 %
Platelets: 358 10*3/uL (ref 150–440)
RBC: 3.26 MIL/uL — ABNORMAL LOW (ref 3.80–5.20)
RDW: 24.7 % — ABNORMAL HIGH (ref 11.5–14.5)
WBC: 2.3 10*3/uL — ABNORMAL LOW (ref 3.6–11.0)

## 2017-01-19 LAB — COMPREHENSIVE METABOLIC PANEL
ALBUMIN: 3.8 g/dL (ref 3.5–5.0)
ALK PHOS: 178 U/L — AB (ref 38–126)
ALT: 116 U/L — AB (ref 14–54)
ANION GAP: 10 (ref 5–15)
AST: 204 U/L — AB (ref 15–41)
BUN: 9 mg/dL (ref 6–20)
CALCIUM: 9.1 mg/dL (ref 8.9–10.3)
CO2: 20 mmol/L — AB (ref 22–32)
CREATININE: 0.57 mg/dL (ref 0.44–1.00)
Chloride: 104 mmol/L (ref 101–111)
GFR calc Af Amer: >60 mL/min (ref >60)
GFR calc non Af Amer: >60 mL/min (ref >60)
GLUCOSE: 88 mg/dL (ref 65–99)
Potassium: 3.5 mmol/L (ref 3.5–5.1)
SODIUM: 134 mmol/L — AB (ref 135–145)
Total Bilirubin: 0.3 mg/dL (ref 0.3–1.2)
Total Protein: 9.2 g/dL — ABNORMAL HIGH (ref 6.5–8.1)

## 2017-01-19 MED ORDER — HEPARIN SOD (PORK) LOCK FLUSH 100 UNIT/ML IV SOLN
500.0000 [IU] | Freq: Once | INTRAVENOUS | Status: AC
  Administered 2017-01-19: 500 [IU] via INTRAVENOUS
  Filled 2017-01-19: qty 5

## 2017-01-20 ENCOUNTER — Telehealth: Payer: Self-pay | Admitting: Internal Medicine

## 2017-01-20 NOTE — Telephone Encounter (Signed)
FYI- Patient's chemotherapy was held on 9/4- secondary to neutropenia- Gemzar day #8.  Also noted that patient AST ALT /alkaline phosphatase elevated- normal bilirubin. Question alcohol versus Gemzar versus others. Await PET scan as ordered.

## 2017-01-29 ENCOUNTER — Encounter: Admission: RE | Admit: 2017-01-29 | Payer: Medicare Other | Source: Ambulatory Visit

## 2017-02-01 ENCOUNTER — Inpatient Hospital Stay: Payer: Medicare Other

## 2017-02-01 ENCOUNTER — Other Ambulatory Visit: Payer: Self-pay | Admitting: *Deleted

## 2017-02-01 ENCOUNTER — Encounter: Payer: Self-pay | Admitting: Radiation Oncology

## 2017-02-01 ENCOUNTER — Ambulatory Visit
Admission: RE | Admit: 2017-02-01 | Discharge: 2017-02-01 | Disposition: A | Discharge status: Home / Self Care | Payer: Medicare Other | Source: Ambulatory Visit | Attending: Radiation Oncology | Admitting: Radiation Oncology

## 2017-02-01 ENCOUNTER — Inpatient Hospital Stay (HOSPITAL_BASED_OUTPATIENT_CLINIC_OR_DEPARTMENT_OTHER): Payer: Medicare Other | Admitting: Internal Medicine

## 2017-02-01 VITALS — BP 114/75 | HR 81 | Temp 96.1°F | Wt 112.2 lb

## 2017-02-01 DIAGNOSIS — R748 Abnormal levels of other serum enzymes: Secondary | ICD-10-CM

## 2017-02-01 DIAGNOSIS — M549 Dorsalgia, unspecified: Secondary | ICD-10-CM

## 2017-02-01 DIAGNOSIS — F17210 Nicotine dependence, cigarettes, uncomplicated: Secondary | ICD-10-CM | POA: Insufficient documentation

## 2017-02-01 DIAGNOSIS — Z923 Personal history of irradiation: Secondary | ICD-10-CM | POA: Diagnosis not present

## 2017-02-01 DIAGNOSIS — C3431 Malignant neoplasm of lower lobe, right bronchus or lung: Secondary | ICD-10-CM | POA: Diagnosis present

## 2017-02-01 DIAGNOSIS — I10 Essential (primary) hypertension: Secondary | ICD-10-CM | POA: Diagnosis not present

## 2017-02-01 DIAGNOSIS — R05 Cough: Secondary | ICD-10-CM

## 2017-02-01 DIAGNOSIS — Z9221 Personal history of antineoplastic chemotherapy: Secondary | ICD-10-CM | POA: Insufficient documentation

## 2017-02-01 DIAGNOSIS — C7801 Secondary malignant neoplasm of right lung: Secondary | ICD-10-CM | POA: Diagnosis not present

## 2017-02-01 DIAGNOSIS — R042 Hemoptysis: Secondary | ICD-10-CM | POA: Diagnosis not present

## 2017-02-01 DIAGNOSIS — Z85118 Personal history of other malignant neoplasm of bronchus and lung: Secondary | ICD-10-CM

## 2017-02-01 DIAGNOSIS — J449 Chronic obstructive pulmonary disease, unspecified: Secondary | ICD-10-CM

## 2017-02-01 DIAGNOSIS — F329 Major depressive disorder, single episode, unspecified: Secondary | ICD-10-CM

## 2017-02-01 DIAGNOSIS — F10129 Alcohol abuse with intoxication, unspecified: Secondary | ICD-10-CM

## 2017-02-01 DIAGNOSIS — C3491 Malignant neoplasm of unspecified part of right bronchus or lung: Secondary | ICD-10-CM

## 2017-02-01 DIAGNOSIS — C7800 Secondary malignant neoplasm of unspecified lung: Secondary | ICD-10-CM | POA: Diagnosis not present

## 2017-02-01 DIAGNOSIS — Z79899 Other long term (current) drug therapy: Secondary | ICD-10-CM

## 2017-02-01 DIAGNOSIS — B182 Chronic viral hepatitis C: Secondary | ICD-10-CM | POA: Diagnosis not present

## 2017-02-01 DIAGNOSIS — G8929 Other chronic pain: Secondary | ICD-10-CM

## 2017-02-01 LAB — CBC WITH DIFFERENTIAL/PLATELET
BASOS ABS: 0.1 10*3/uL (ref 0–0.1)
BASOS PCT: 2 %
EOS PCT: 2 %
Eosinophils Absolute: 0.1 10*3/uL (ref 0–0.7)
HCT: 30.7 % — ABNORMAL LOW (ref 35.0–47.0)
Hemoglobin: 10.7 g/dL — ABNORMAL LOW (ref 12.0–16.0)
Lymphocytes Relative: 39 %
Lymphs Abs: 1.4 10*3/uL (ref 1.0–3.6)
MCH: 32.3 pg (ref 26.0–34.0)
MCHC: 34.8 g/dL (ref 32.0–36.0)
MCV: 92.9 fL (ref 80.0–100.0)
MONO ABS: 0.5 10*3/uL (ref 0.2–0.9)
Monocytes Relative: 14 %
Neutro Abs: 1.6 10*3/uL (ref 1.4–6.5)
Neutrophils Relative %: 43 %
PLATELETS: 246 10*3/uL (ref 150–440)
RBC: 3.3 MIL/uL — AB (ref 3.80–5.20)
RDW: 22.4 % — AB (ref 11.5–14.5)
WBC: 3.6 10*3/uL (ref 3.6–11.0)

## 2017-02-01 LAB — COMPREHENSIVE METABOLIC PANEL
ALBUMIN: 3.6 g/dL (ref 3.5–5.0)
ALT: 43 U/L (ref 14–54)
AST: 86 U/L — AB (ref 15–41)
Alkaline Phosphatase: 128 U/L — ABNORMAL HIGH (ref 38–126)
Anion gap: 10 (ref 5–15)
BUN: 10 mg/dL (ref 6–20)
CO2: 22 mmol/L (ref 22–32)
Calcium: 8.9 mg/dL (ref 8.9–10.3)
Chloride: 106 mmol/L (ref 101–111)
Creatinine, Ser: 0.58 mg/dL (ref 0.44–1.00)
GFR calc Af Amer: >60 mL/min (ref >60)
GFR calc non Af Amer: >60 mL/min (ref >60)
GLUCOSE: 92 mg/dL (ref 65–99)
POTASSIUM: 3.7 mmol/L (ref 3.5–5.1)
SODIUM: 138 mmol/L (ref 135–145)
Total Bilirubin: 0.4 mg/dL (ref 0.3–1.2)
Total Protein: 8.5 g/dL — ABNORMAL HIGH (ref 6.5–8.1)

## 2017-02-01 MED ORDER — SODIUM CHLORIDE 0.9 % IV SOLN
Freq: Once | INTRAVENOUS | Status: AC
  Administered 2017-02-01: 11:00:00 via INTRAVENOUS
  Filled 2017-02-01: qty 1000

## 2017-02-01 MED ORDER — HEPARIN SOD (PORK) LOCK FLUSH 100 UNIT/ML IV SOLN
500.0000 [IU] | Freq: Once | INTRAVENOUS | Status: DC
  Filled 2017-02-01: qty 5

## 2017-02-01 MED ORDER — HEPARIN SOD (PORK) LOCK FLUSH 100 UNIT/ML IV SOLN
500.0000 [IU] | Freq: Once | INTRAVENOUS | Status: AC | PRN
  Administered 2017-02-01: 500 [IU]

## 2017-02-01 MED ORDER — SODIUM CHLORIDE 0.9% FLUSH
10.0000 mL | INTRAVENOUS | Status: DC | PRN
  Administered 2017-02-01: 10 mL via INTRAVENOUS
  Filled 2017-02-01: qty 10

## 2017-02-01 MED ORDER — PROCHLORPERAZINE MALEATE 10 MG PO TABS
10.0000 mg | ORAL_TABLET | Freq: Once | ORAL | Status: AC
  Administered 2017-02-01: 10 mg via ORAL
  Filled 2017-02-01: qty 1

## 2017-02-01 MED ORDER — SODIUM CHLORIDE 0.9% FLUSH
10.0000 mL | INTRAVENOUS | Status: DC | PRN
  Administered 2017-02-01: 10 mL
  Filled 2017-02-01: qty 10

## 2017-02-01 MED ORDER — SODIUM CHLORIDE 0.9 % IV SOLN
1600.0000 mg | Freq: Once | INTRAVENOUS | Status: AC
  Administered 2017-02-01: 1600 mg via INTRAVENOUS
  Filled 2017-02-01: qty 42

## 2017-02-01 NOTE — Assessment & Plan Note (Addendum)
#   Right lung ca- with recurrence [based on imaging]- metastases to right lung ~10-21m;  July 5th 2018- progression of the right lower lobe primary mass; and also the right upper lobe and the right medial lobe lesions. Currently on gemcitabine single agent.   # Proceed with gemcitabine single agent chemotherapy today. Labs today reviewed;  acceptable for treatment today except for slightly elevated LFTs. No obvious evidence of progression at this time. Reminded to get the PET scan done ASAP.  # Alcohol abuse-  discussed with the patient that alcohol can interfere with chemotherapy; and also cause liver dysfunction. Recommend abstinence.  # COPD/bronchitis-STABLE/improved.   # follow up in 3 weeks/labs; 1 week/lab-chemo; PET rescheduled ASAP for any role for possible radiation.

## 2017-02-01 NOTE — Progress Notes (Signed)
[facility] OFFICE PROGRESS NOTE  Patient Care Team: Center, [facility] as PCP - General (General Practice)  Cancer Staging No matching staging information was found for the patient.   Oncology History   Chief Complaint/Diagnosis:   1. 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 Faith Rogue, cardiothoracic surgeon) 2. Started on radiation and concurrent chemotherapy with carboplatinum Taxol from April of 2013 3. Finished radiation and chemotherapy with carboplatinum and Taxol in May of 2013 4. Stage IIIB (T4N3M0) NSCLC who completed a course of EBRT on Oct 07, 2011 with a total dose of 7000cGy.   5. Starting consolidation with 2 cycles of carboplatinum and Abraxane 6.patient is off chemotherapy since November of 2013.  Repeat CT scan shows stable disease(January, 2014) 7  Started been on Taxotere and  RAMICIRUMAB from June 07, 2013 8.chemotherapy with Taxotere  and  RAMUCIRUMAB discontinued in March of 2015 because of hemoptysis.  9.Started onNIVOLULAMAB April of 2015. 10NIVOLULAMABWas put on hold because of abnormal liver enzymes January of 2016  # AUG 2017- Right lung recurrence [2 nodules - ~1cm; stable lung mass/fibrosis]. START OPDIVO q 2W [small to Bx; s/p rad-onc eval]; opdivo x4- CT OCT 16th- PROGRESSION 2 lung nodules ~82mm;   # JAN 2018-carbo-TAXOL s/p 2 cycles- CT- PR; but carbo-anaphylaxis; STOPPED chemo  # July 2018- PROGRESSION- GEMCITABINE  # MOLECULAR TESTING- ? Not enough tissue.      Primary cancer of bronchus of right lower lobe (HCC)     INTERVAL HISTORY:  Jeanette Jones 61 y.o.  female pleasant patient above history of stage IV lung cancer squamous cell/ recurrent- Most recently on gemcitabine single agent. Patient did not get PET scan done- because of inclement weather. She did not have it rescheduled.   2 weeks ago gemcitabine  day #8 was held because of elevated LFTs.   She continues to complain of chronic cough with intermittent blood in sputum. This is not getting any worse. Denies any headaches. Has chronic shortness of breath not any worse.  Unfortunately patient continues to drink beer "a lot". Patient continues to complain of chronic back pain bilaterally pain. Denies any trauma.    REVIEW OF SYSTEMS:  A complete 10 point review of system is done which is negative except mentioned above/history of present illness.   PAST MEDICAL HISTORY :  Past Medical History:  Diagnosis Date  . Back pain, chronic   . Carcinoma of lung ([place]) 07/2011  . Cough   . Depression   . Dyspnea   . Emphysema of lung ([place])   . Heart murmur   . Hep C w/o coma, chronic ([place])   . Hypertension   . Lung cancer ([place])   . Swallowing difficulty     PAST SURGICAL HISTORY :   Past Surgical History:  Procedure Laterality Date  . ESOPHAGOGASTRODUODENOSCOPY N/A 06/08/2015   Procedure: ESOPHAGOGASTRODUODENOSCOPY (EGD);  Surgeon: Hulen Luster, MD;  Location: [facility] ENDOSCOPY;  Service: Gastroenterology;  Laterality: N/A;  . HEMORROIDECTOMY    . LUNG BIOPSY    . TUBAL LIGATION      FAMILY HISTORY :   Family History  Problem Relation Age of Onset  . Breast cancer Neg Hx     SOCIAL HISTORY:   Social History  Substance Use Topics  . Smoking status: Current Every Day Smoker    Packs/day: 0.25    Years: 40.00  Types: Cigarettes  . Smokeless tobacco: Never Used  . Alcohol use Yes     Comment: beer on saturday    ALLERGIES:  is allergic to carboplatin.  MEDICATIONS:  Current Outpatient Prescriptions  Medication Sig Dispense Refill  . cyclobenzaprine (FLEXERIL) 10 MG tablet Take 10 mg by mouth at bedtime.    . Fluticasone-Salmeterol (ADVAIR DISKUS) 500-50 MCG/DOSE AEPB Inhale 1 puff into the lungs 2 (two) times daily. 1 each 3  . gabapentin (NEURONTIN) 100 MG capsule Take 1 capsule (100 mg total) by mouth 2 (two) times daily.  90 capsule 3  . ipratropium-albuterol (DUONEB) 0.5-2.5 (3) MG/3ML SOLN Take 3 mLs by nebulization every 4 (four) hours as needed. 360 mL 3  . lidocaine-prilocaine (EMLA) cream Apply 1 application topically as needed. To port a cath site- 1 hr prior to port access 30 g 3  . ondansetron (ZOFRAN) 8 MG tablet Take 1 tablet (8 mg total) by mouth every 8 (eight) hours as needed for nausea or vomiting (start 3 days; after chemo). 40 tablet 1  . potassium chloride 20 MEQ/15ML (10%) SOLN Take 15 mLs (20 mEq total) by mouth 2 (two) times daily. 300 mL 0  . prochlorperazine (COMPAZINE) 10 MG tablet Take 1 tablet (10 mg total) by mouth every 6 (six) hours as needed for nausea or vomiting. 40 tablet 1  . traMADol (ULTRAM) 50 MG tablet Take 1 tablet (50 mg total) by mouth every 8 (eight) hours as needed. 90 tablet 0   No current facility-administered medications for this visit.    Facility-Administered Medications Ordered in Other Visits  Medication Dose Route Frequency Provider Last Rate Last Dose  . heparin lock flush 100 unit/mL  500 Units Intravenous Once Charlaine Dalton R, MD      . sodium chloride flush (NS) 0.9 % injection 10 mL  10 mL Intravenous PRN Cammie Sickle, MD   10 mL at 02/01/17 0915  . sodium chloride flush (NS) 0.9 % injection 10 mL  10 mL Intracatheter PRN Cammie Sickle, MD   10 mL at 02/01/17 1140    PHYSICAL EXAMINATION: ECOG PERFORMANCE STATUS: 0 - Asymptomatic  There were no vitals taken for this visit.  There were no vitals filed for this visit.  GENERAL: Well-nourished well-developed; Alert, no distress and comfortable.   Alone.  EYES: no pallor or icterus OROPHARYNX: no thrush or ulceration; good dentition  NECK: supple, no masses felt LYMPH:  no palpable lymphadenopathy in the cervical, axillary or inguinal regions LUNGS: Decreased air entry to auscultation bilaterally and  No wheeze or crackles HEART/CVS: regular rate & rhythm and no murmurs; No lower  extremity edema ABDOMEN:abdomen soft, non-tender and normal bowel sounds Musculoskeletal:no cyanosis of digits and no clubbing  PSYCH: alert & oriented x 3 with fluent speech NEURO: no focal motor/sensory deficits SKIN:  no rashes or significant lesions  LABORATORY DATA:  I have reviewed the data as listed    Component Value Date/Time   NA 138 02/01/2017 0918   NA 135 08/29/2014 1457   K 3.7 02/01/2017 0918   K 3.7 08/29/2014 1457   CL 106 02/01/2017 0918   CL 105 08/29/2014 1457   CO2 22 02/01/2017 0918   CO2 24 08/29/2014 1457   GLUCOSE 92 02/01/2017 0918   GLUCOSE 98 08/29/2014 1457   BUN 10 02/01/2017 0918   BUN 8 08/29/2014 1457   CREATININE 0.58 02/01/2017 0918   CREATININE 0.64 08/29/2014 1457   CALCIUM 8.9 02/01/2017 4401  CALCIUM 9.0 08/29/2014 1457   PROT 8.5 (H) 02/01/2017 0918   PROT 9.3 (H) 08/29/2014 1457   ALBUMIN 3.6 02/01/2017 0918   ALBUMIN 3.6 08/29/2014 1457   AST 86 (H) 02/01/2017 0918   AST 111 (H) 08/29/2014 1457   ALT 43 02/01/2017 0918   ALT 78 (H) 08/29/2014 1457   ALKPHOS 128 (H) 02/01/2017 0918   ALKPHOS 168 (H) 08/29/2014 1457   BILITOT 0.4 02/01/2017 0918   BILITOT 0.6 08/29/2014 1457   GFRNONAA >60 02/01/2017 0918   GFRNONAA >60 08/29/2014 1457   GFRAA >60 02/01/2017 0918   GFRAA >60 08/29/2014 1457    No results found for: SPEP, UPEP  Lab Results  Component Value Date   WBC 3.6 02/01/2017   NEUTROABS 1.6 02/01/2017   HGB 10.7 (L) 02/01/2017   HCT 30.7 (L) 02/01/2017   MCV 92.9 02/01/2017   PLT 246 02/01/2017      Chemistry      Component Value Date/Time   NA 138 02/01/2017 0918   NA 135 08/29/2014 1457   K 3.7 02/01/2017 0918   K 3.7 08/29/2014 1457   CL 106 02/01/2017 0918   CL 105 08/29/2014 1457   CO2 22 02/01/2017 0918   CO2 24 08/29/2014 1457   BUN 10 02/01/2017 0918   BUN 8 08/29/2014 1457   CREATININE 0.58 02/01/2017 0918   CREATININE 0.64 08/29/2014 1457      Component Value Date/Time   CALCIUM 8.9  02/01/2017 0918   CALCIUM 9.0 08/29/2014 1457   ALKPHOS 128 (H) 02/01/2017 0918   ALKPHOS 168 (H) 08/29/2014 1457   AST 86 (H) 02/01/2017 0918   AST 111 (H) 08/29/2014 1457   ALT 43 02/01/2017 0918   ALT 78 (H) 08/29/2014 1457   BILITOT 0.4 02/01/2017 0918   BILITOT 0.6 08/29/2014 1457      IMPRESSION: 1. No evidence of osseous metastases. 2. Focal increased radiotracer uptake about both knees, right greater than left, most suggestive of degenerative change. 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 Recommend correlation with plain films of the bilateral knees and bilateral tibia-fibula.   Electronically Signed   By: Franki Cabot M.D.   On: 01/08/2017 14:54    RADIOGRAPHIC STUDIES: I have personally reviewed the radiological images as listed and agreed with the findings in the report. No results found.   ASSESSMENT & PLAN:  Primary cancer of bronchus of right lower lobe ([place]) # Right lung ca- with recurrence [based on imaging]- metastases to right lung ~10-75m;  July 5th 2018- progression of the right lower lobe primary mass; and also the right upper lobe and the right medial lobe lesions. Currently on gemcitabine single agent.   # Proceed with gemcitabine single agent chemotherapy today. Labs today reviewed;  acceptable for treatment today except for slightly elevated LFTs. No obvious evidence of progression at this time. Reminded to get the PET scan done ASAP.  # Alcohol abuse-  discussed with the patient that alcohol can interfere with chemotherapy; and also cause liver dysfunction. Recommend abstinence.  # COPD/bronchitis-STABLE/improved.   # follow up in 3 weeks/labs; 1 week/lab-chemo; PET rescheduled ASAP for any role for possible radiation.    Orders Placed This Encounter  Procedures  . CBC with Differential/Platelet    Standing Status:    Future    Standing Expiration Date:   02/01/2018  . Comprehensive metabolic panel    Standing Status:  Future    Standing Expiration Date:   02/01/2018  . CBC with Differential/Platelet    Standing Status:   Future    Standing Expiration Date:   02/01/2018  . Comprehensive metabolic panel    Standing Status:   Future    Standing Expiration Date:   02/01/2018      Cammie Sickle, MD 02/01/2017 12:32 PM

## 2017-02-01 NOTE — Progress Notes (Signed)
Radiation Oncology Follow up Note  Name: Jeanette Jones   Date:   02/01/2017 MRN:  [id number] DOB: [birth year]    This 61 y.o. 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 We've been following her for 2 right lung nodules and was placed on immunotherapy treatments back in November 2017.. I have reevaluated her as she is currently on immunotherapy withOPDIVO q 2W . She is since had multiple chemotherapy interventions including gemcitabine and carbotaxol. She's been having some increasing back pain both PET CT scan and bone scan have been ordered not yet performed. Currently her right upper lobe pulmonary nodule has decreased in size. Her most recent CT scan performed 11/19/2016 shows some progression in a 5 cm mass in the middle right lower lobe. She also has a 2 cm right upper lobe lesion increased in size. We ordered a bone scan which she's had performed showing no evidence of osseous metastasis. She also PET CT scan although that was canceled Nancie Neas to whether we are awaiting those results. REFERRING PROVIDER: [place]  HPI: As above.  COMPLICATIONS OF TREATMENT: none  FOLLOW UP COMPLIANCE: keeps appointments   PHYSICAL EXAM:  BP 114/75   Pulse 81   Temp (!) 96.1 F (35.6 C)   Wt 112 lb 3.4 oz (50.9 kg)   BMI 19.88 kg/m  Well-developed well-nourished patient in NAD. HEENT reveals PERLA, EOMI, discs not visualized.  Oral cavity is clear. No oral mucosal lesions are identified. Neck is clear without evidence of cervical or supraclavicular adenopathy. Lungs are clear to A&P. Cardiac examination is essentially unremarkable with regular rate and rhythm without murmur rub or thrill. Abdomen is benign with no organomegaly or masses noted. Motor  sensory and DTR levels are equal and symmetric in the upper and lower extremities. Cranial nerves II through XII are grossly intact. Proprioception is intact. No peripheral adenopathy or edema is identified. No motor or sensory levels are noted. Crude visual fields are within normal range.  RADIOLOGY RESULTS: Bone scan reviewed PET CT scan rescheduled  PLAN: I would still like to have her PET CT scan performed in reevaluate that for any evidence of progression in her chest which we may be amenable to touch up with radiation therapy. We have rescheduled her PET CT scan she is seeing medical oncology this morning. We will make further recommendations based on her PET scan when that is performed. And I will discuss the case personally with medical oncology at that time.  I would like to take this opportunity to thank you for allowing me to participate in the care of your patient.Armstead Peaks., MD

## 2017-02-08 ENCOUNTER — Ambulatory Visit: Payer: Medicare Other | Admitting: Radiation Oncology

## 2017-02-08 ENCOUNTER — Inpatient Hospital Stay: Payer: Medicare Other

## 2017-02-08 ENCOUNTER — Telehealth: Payer: Self-pay | Admitting: Internal Medicine

## 2017-02-08 ENCOUNTER — Other Ambulatory Visit: Payer: Self-pay | Admitting: Internal Medicine

## 2017-02-08 VITALS — BP 149/82 | HR 90 | Temp 95.7°F | Resp 20 | Wt 111.2 lb

## 2017-02-08 DIAGNOSIS — C3431 Malignant neoplasm of lower lobe, right bronchus or lung: Secondary | ICD-10-CM

## 2017-02-08 LAB — CBC WITH DIFFERENTIAL/PLATELET
BASOS ABS: 0 10*3/uL (ref 0–0.1)
Basophils Relative: 2 %
EOS ABS: 0 10*3/uL (ref 0–0.7)
EOS PCT: 0 %
HCT: 29.6 % — ABNORMAL LOW (ref 35.0–47.0)
Hemoglobin: 10.3 g/dL — ABNORMAL LOW (ref 12.0–16.0)
Lymphocytes Relative: 49 %
Lymphs Abs: 1.1 10*3/uL (ref 1.0–3.6)
MCH: 32.7 pg (ref 26.0–34.0)
MCHC: 34.8 g/dL (ref 32.0–36.0)
MCV: 93.8 fL (ref 80.0–100.0)
MONOS PCT: 17 %
Monocytes Absolute: 0.4 10*3/uL (ref 0.2–0.9)
NEUTROS ABS: 0.7 10*3/uL — AB (ref 1.4–6.5)
NEUTROS PCT: 32 %
PLATELETS: 181 10*3/uL (ref 150–440)
RBC: 3.16 MIL/uL — AB (ref 3.80–5.20)
RDW: 20 % — AB (ref 11.5–14.5)
WBC: 2.2 10*3/uL — ABNORMAL LOW (ref 3.6–11.0)

## 2017-02-08 LAB — COMPREHENSIVE METABOLIC PANEL
ALT: 124 U/L — AB (ref 14–54)
AST: 271 U/L — AB (ref 15–41)
Albumin: 3.5 g/dL (ref 3.5–5.0)
Alkaline Phosphatase: 171 U/L — ABNORMAL HIGH (ref 38–126)
Anion gap: 9 (ref 5–15)
BUN: 12 mg/dL (ref 6–20)
CHLORIDE: 106 mmol/L (ref 101–111)
CO2: 20 mmol/L — AB (ref 22–32)
CREATININE: 0.59 mg/dL (ref 0.44–1.00)
Calcium: 8.9 mg/dL (ref 8.9–10.3)
GFR calc Af Amer: >60 mL/min (ref >60)
GFR calc non Af Amer: >60 mL/min (ref >60)
Glucose, Bld: 87 mg/dL (ref 65–99)
POTASSIUM: 3.8 mmol/L (ref 3.5–5.1)
SODIUM: 135 mmol/L (ref 135–145)
Total Bilirubin: 0.4 mg/dL (ref 0.3–1.2)
Total Protein: 8.7 g/dL — ABNORMAL HIGH (ref 6.5–8.1)

## 2017-02-08 MED ORDER — HEPARIN SOD (PORK) LOCK FLUSH 100 UNIT/ML IV SOLN
500.0000 [IU] | Freq: Once | INTRAVENOUS | Status: AC
  Administered 2017-02-08: 500 [IU] via INTRAVENOUS

## 2017-02-08 MED ORDER — HEPARIN SOD (PORK) LOCK FLUSH 100 UNIT/ML IV SOLN
INTRAVENOUS | Status: AC
  Filled 2017-02-08: qty 5

## 2017-02-08 NOTE — Progress Notes (Signed)
LFTs elevated and ANC 0.7. Per Dr Rogue Bussing hold treatment today.

## 2017-02-08 NOTE — Telephone Encounter (Signed)
HOLD chemo sec to elevated LFTs/ANC-700; follow up as planned. Dr.B

## 2017-02-09 DIAGNOSIS — M25519 Pain in unspecified shoulder: Secondary | ICD-10-CM

## 2017-02-09 HISTORY — DX: Pain in unspecified shoulder: M25.519

## 2017-02-12 ENCOUNTER — Encounter
Admission: RE | Admit: 2017-02-12 | Discharge: 2017-02-12 | Disposition: A | Discharge status: Home / Self Care | Payer: Medicare Other | Source: Ambulatory Visit | Attending: Internal Medicine | Admitting: Internal Medicine

## 2017-02-12 DIAGNOSIS — G8929 Other chronic pain: Secondary | ICD-10-CM | POA: Insufficient documentation

## 2017-02-12 DIAGNOSIS — C3431 Malignant neoplasm of lower lobe, right bronchus or lung: Secondary | ICD-10-CM | POA: Insufficient documentation

## 2017-02-12 DIAGNOSIS — M25561 Pain in right knee: Secondary | ICD-10-CM | POA: Diagnosis present

## 2017-02-12 DIAGNOSIS — M25562 Pain in left knee: Secondary | ICD-10-CM | POA: Diagnosis present

## 2017-02-12 DIAGNOSIS — Z9851 Tubal ligation status: Secondary | ICD-10-CM

## 2017-02-12 DIAGNOSIS — M25569 Pain in unspecified knee: Secondary | ICD-10-CM

## 2017-02-12 HISTORY — DX: Pain in unspecified knee: M25.569

## 2017-02-12 HISTORY — DX: Tubal ligation status: Z98.51

## 2017-02-12 HISTORY — DX: Pain in unspecified shoulder: M25.519

## 2017-02-12 LAB — GLUCOSE, CAPILLARY: Glucose-Capillary: 103 mg/dL — ABNORMAL HIGH (ref 65–99)

## 2017-02-12 MED ORDER — FLUDEOXYGLUCOSE F - 18 (FDG) INJECTION
12.2100 | Freq: Once | INTRAVENOUS | Status: AC | PRN
  Administered 2017-02-12: 12.21 via INTRAVENOUS

## 2017-02-22 ENCOUNTER — Inpatient Hospital Stay: Payer: Medicare Other

## 2017-02-22 ENCOUNTER — Inpatient Hospital Stay: Payer: Medicare Other | Attending: Internal Medicine | Admitting: Internal Medicine

## 2017-02-22 VITALS — BP 124/81 | HR 89 | Temp 97.6°F | Resp 20 | Ht 63.0 in | Wt 112.5 lb

## 2017-02-22 DIAGNOSIS — Z79899 Other long term (current) drug therapy: Secondary | ICD-10-CM | POA: Diagnosis not present

## 2017-02-22 DIAGNOSIS — B182 Chronic viral hepatitis C: Secondary | ICD-10-CM | POA: Diagnosis not present

## 2017-02-22 DIAGNOSIS — Z5111 Encounter for antineoplastic chemotherapy: Secondary | ICD-10-CM | POA: Insufficient documentation

## 2017-02-22 DIAGNOSIS — F1010 Alcohol abuse, uncomplicated: Secondary | ICD-10-CM

## 2017-02-22 DIAGNOSIS — F17210 Nicotine dependence, cigarettes, uncomplicated: Secondary | ICD-10-CM | POA: Insufficient documentation

## 2017-02-22 DIAGNOSIS — Z923 Personal history of irradiation: Secondary | ICD-10-CM | POA: Insufficient documentation

## 2017-02-22 DIAGNOSIS — C7801 Secondary malignant neoplasm of right lung: Secondary | ICD-10-CM | POA: Diagnosis not present

## 2017-02-22 DIAGNOSIS — I10 Essential (primary) hypertension: Secondary | ICD-10-CM | POA: Insufficient documentation

## 2017-02-22 DIAGNOSIS — C3431 Malignant neoplasm of lower lobe, right bronchus or lung: Secondary | ICD-10-CM | POA: Diagnosis not present

## 2017-02-22 DIAGNOSIS — D72819 Decreased white blood cell count, unspecified: Secondary | ICD-10-CM | POA: Diagnosis not present

## 2017-02-22 DIAGNOSIS — F329 Major depressive disorder, single episode, unspecified: Secondary | ICD-10-CM | POA: Insufficient documentation

## 2017-02-22 DIAGNOSIS — J449 Chronic obstructive pulmonary disease, unspecified: Secondary | ICD-10-CM | POA: Diagnosis not present

## 2017-02-22 LAB — CBC WITH DIFFERENTIAL/PLATELET
Basophils Absolute: 0.1 10*3/uL (ref 0–0.1)
Basophils Relative: 2 %
EOS ABS: 0.1 10*3/uL (ref 0–0.7)
EOS PCT: 2 %
HCT: 31.8 % — ABNORMAL LOW (ref 35.0–47.0)
HEMOGLOBIN: 10.6 g/dL — AB (ref 12.0–16.0)
LYMPHS ABS: 1.3 10*3/uL (ref 1.0–3.6)
LYMPHS PCT: 36 %
MCH: 31.6 pg (ref 26.0–34.0)
MCHC: 33.3 g/dL (ref 32.0–36.0)
MCV: 95 fL (ref 80.0–100.0)
MONOS PCT: 18 %
Monocytes Absolute: 0.6 10*3/uL (ref 0.2–0.9)
NEUTROS PCT: 42 %
Neutro Abs: 1.5 10*3/uL (ref 1.4–6.5)
Platelets: 312 10*3/uL (ref 150–440)
RBC: 3.35 MIL/uL — AB (ref 3.80–5.20)
RDW: 18.7 % — ABNORMAL HIGH (ref 11.5–14.5)
WBC: 3.5 10*3/uL — AB (ref 3.6–11.0)

## 2017-02-22 LAB — COMPREHENSIVE METABOLIC PANEL
ALK PHOS: 131 U/L — AB (ref 38–126)
ALT: 46 U/L (ref 14–54)
ANION GAP: 7 (ref 5–15)
AST: 97 U/L — ABNORMAL HIGH (ref 15–41)
Albumin: 3.7 g/dL (ref 3.5–5.0)
BUN: 12 mg/dL (ref 6–20)
CO2: 24 mmol/L (ref 22–32)
Calcium: 9 mg/dL (ref 8.9–10.3)
Chloride: 107 mmol/L (ref 101–111)
Creatinine, Ser: 0.71 mg/dL (ref 0.44–1.00)
GLUCOSE: 97 mg/dL (ref 65–99)
POTASSIUM: 3.7 mmol/L (ref 3.5–5.1)
Sodium: 138 mmol/L (ref 135–145)
Total Bilirubin: 0.4 mg/dL (ref 0.3–1.2)
Total Protein: 8.5 g/dL — ABNORMAL HIGH (ref 6.5–8.1)

## 2017-02-22 MED ORDER — SODIUM CHLORIDE 0.9% FLUSH
10.0000 mL | Freq: Once | INTRAVENOUS | Status: AC
  Administered 2017-02-22: 10 mL via INTRAVENOUS
  Filled 2017-02-22: qty 10

## 2017-02-22 MED ORDER — HEPARIN SOD (PORK) LOCK FLUSH 100 UNIT/ML IV SOLN
500.0000 [IU] | Freq: Once | INTRAVENOUS | Status: AC
  Administered 2017-02-22: 500 [IU] via INTRAVENOUS
  Filled 2017-02-22: qty 5

## 2017-02-22 NOTE — Progress Notes (Signed)
[name] OFFICE PROGRESS NOTE  Patient Care Team: Center, [facility] as PCP - General (General Practice)  Cancer Staging No matching staging information was found for the patient.   Oncology History   Chief Complaint/Diagnosis:   1. 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 Faith Rogue, cardiothoracic surgeon) 2. Started on radiation and concurrent chemotherapy with carboplatinum Taxol from April of 2013 3. Finished radiation and chemotherapy with carboplatinum and Taxol in May of 2013 4. Stage IIIB (T4N3M0) NSCLC who completed a course of EBRT on Oct 07, 2011 with a total dose of 7000cGy.   5. Starting consolidation with 2 cycles of carboplatinum and Abraxane 6.patient is off chemotherapy since November of 2013.  Repeat CT scan shows stable disease(January, 2014) 7  Started been on Taxotere and  RAMICIRUMAB from June 07, 2013 8.chemotherapy with Taxotere  and  RAMUCIRUMAB discontinued in March of 2015 because of hemoptysis.  9.Started onNIVOLULAMAB April of 2015. 10NIVOLULAMABWas put on hold because of abnormal liver enzymes January of 2016  # AUG 2017- Right lung recurrence [2 nodules - ~1cm; stable lung mass/fibrosis]. START OPDIVO q 2W [small to Bx; s/p rad-onc eval]; opdivo x4- CT OCT 16th- PROGRESSION 2 lung nodules ~15mm;   # JAN 2018-carbo-TAXOL s/p 2 cycles- CT- PR; but carbo-anaphylaxis; STOPPED chemo  # July 2018- PROGRESSION- GEMCITABINE; 02/12/2017- PROGRESSION [? LFTs elevated sec to Gem; Gem Discontinued]  # MID OCT 2018- Abraxane   # MOLECULAR TESTING- ? Not enough tissue; Oct 2018- right supraclav Bx- for molecular testing.      Primary cancer of bronchus of right lower lobe (HCC)     INTERVAL HISTORY:  Jeanette Jones 61 y.o.  female pleasant patient above history of stage IV lung cancer squamous cell/ recurrent- Most recently  on gemcitabine single agent is here to review the results of PET scan.  Patient continues to complain of chronic worsening cough. She has intermittent blood in sputum. This is not getting any better or worse. Continues to complain of pain when coughing. Interested in getting tramadol.  Unfortunately patient continues to drink alcohol"a lot". Patient continues to complain of chronic back pain bilaterally pain. Denies any trauma. Denies any headaches. Denies any nausea vomiting. Denies any skin itch.  REVIEW OF SYSTEMS:  A complete 10 point review of system is done which is negative except mentioned above/history of present illness.   PAST MEDICAL HISTORY :  Past Medical History:  Diagnosis Date  . Back pain, chronic   . Carcinoma of lung ([name]) 07/2011  . Cough   . Depression   . Dyspnea   . Emphysema of lung ([name])   . H/O tubal ligation 02/12/2017   From chart  . Heart murmur   . Hep C w/o coma, chronic ([name])   . Hypertension   . Knee pain 02/12/2017  . Lung cancer ([name])   . Shoulder pain 02/09/2017  . Swallowing difficulty     PAST SURGICAL HISTORY :   Past Surgical History:  Procedure Laterality Date  . ESOPHAGOGASTRODUODENOSCOPY N/A 06/08/2015   Procedure: ESOPHAGOGASTRODUODENOSCOPY (EGD);  Surgeon: Hulen Luster, MD;  Location: [name] ENDOSCOPY;  Service: Gastroenterology;  Laterality: N/A;  . HEMORROIDECTOMY    . LUNG BIOPSY    . TUBAL LIGATION      FAMILY HISTORY :   Family History  Problem Relation Age of Onset  . Breast cancer Neg Hx  SOCIAL HISTORY:   Social History  Substance Use Topics  . Smoking status: Current Every Day Smoker    Packs/day: 0.25    Years: 40.00    Types: Cigarettes  . Smokeless tobacco: Never Used  . Alcohol use Yes     Comment: beer on saturday    ALLERGIES:  is allergic to carboplatin.  MEDICATIONS:  Current Outpatient Prescriptions  Medication Sig Dispense Refill  . cyclobenzaprine (FLEXERIL) 10 MG tablet Take 10 mg by mouth at  bedtime.    . traMADol (ULTRAM) 50 MG tablet Take 1 tablet (50 mg total) by mouth every 8 (eight) hours as needed. 90 tablet 0  . Fluticasone-Salmeterol (ADVAIR DISKUS) 500-50 MCG/DOSE AEPB Inhale 1 puff into the lungs 2 (two) times daily. (Patient not taking: Reported on 02/22/2017) 1 each 3  . lidocaine-prilocaine (EMLA) cream Apply 1 application topically as needed. To port a cath site- 1 hr prior to port access (Patient not taking: Reported on 02/22/2017) 30 g 3  . ondansetron (ZOFRAN) 8 MG tablet Take 1 tablet (8 mg total) by mouth every 8 (eight) hours as needed for nausea or vomiting (start 3 days; after chemo). (Patient not taking: Reported on 02/22/2017) 40 tablet 1  . potassium chloride 20 MEQ/15ML (10%) SOLN Take 15 mLs (20 mEq total) by mouth 2 (two) times daily. (Patient not taking: Reported on 02/22/2017) 300 mL 0  . prochlorperazine (COMPAZINE) 10 MG tablet Take 1 tablet (10 mg total) by mouth every 6 (six) hours as needed for nausea or vomiting. (Patient not taking: Reported on 02/22/2017) 40 tablet 1   No current facility-administered medications for this visit.     PHYSICAL EXAMINATION: ECOG PERFORMANCE STATUS: 0 - Asymptomatic  BP 124/81   Pulse 89   Temp 97.6 F (36.4 C) (Tympanic)   Resp 20   Ht 5\' 3"  (1.6 m)   Wt 112 lb 8 oz (51 kg)   BMI 19.93 kg/m   Filed Weights   02/22/17 0916  Weight: 112 lb 8 oz (51 kg)    GENERAL: Well-nourished well-developed; Alert, no distress and comfortable.   Alone.  EYES: no pallor or icterus OROPHARYNX: no thrush or ulceration; good dentition  NECK: supple, no masses felt LYMPH:  no palpable lymphadenopathy in the cervical, axillary or inguinal regions LUNGS: Decreased air entry to auscultation bilaterally and  No wheeze or crackles HEART/CVS: regular rate & rhythm and no murmurs; No lower extremity edema ABDOMEN:abdomen soft, non-tender and normal bowel sounds Musculoskeletal:no cyanosis of digits and no clubbing  PSYCH: alert &  oriented x 3 with fluent speech NEURO: no focal motor/sensory deficits SKIN:  no rashes or significant lesions  LABORATORY DATA:  I have reviewed the data as listed    Component Value Date/Time   NA 138 02/22/2017 0906   NA 135 08/29/2014 1457   K 3.7 02/22/2017 0906   K 3.7 08/29/2014 1457   CL 107 02/22/2017 0906   CL 105 08/29/2014 1457   CO2 24 02/22/2017 0906   CO2 24 08/29/2014 1457   GLUCOSE 97 02/22/2017 0906   GLUCOSE 98 08/29/2014 1457   BUN 12 02/22/2017 0906   BUN 8 08/29/2014 1457   CREATININE 0.71 02/22/2017 0906   CREATININE 0.64 08/29/2014 1457   CALCIUM 9.0 02/22/2017 0906   CALCIUM 9.0 08/29/2014 1457   PROT 8.5 (H) 02/22/2017 0906   PROT 9.3 (H) 08/29/2014 1457   ALBUMIN 3.7 02/22/2017 0906   ALBUMIN 3.6 08/29/2014 1457   AST 97 (  H) 02/22/2017 0906   AST 111 (H) 08/29/2014 1457   ALT 46 02/22/2017 0906   ALT 78 (H) 08/29/2014 1457   ALKPHOS 131 (H) 02/22/2017 0906   ALKPHOS 168 (H) 08/29/2014 1457   BILITOT 0.4 02/22/2017 0906   BILITOT 0.6 08/29/2014 1457   GFRNONAA >60 02/22/2017 0906   GFRNONAA >60 08/29/2014 1457   GFRAA >60 02/22/2017 0906   GFRAA >60 08/29/2014 1457    No results found for: SPEP, UPEP  Lab Results  Component Value Date   WBC 3.5 (L) 02/22/2017   NEUTROABS 1.5 02/22/2017   HGB 10.6 (L) 02/22/2017   HCT 31.8 (L) 02/22/2017   MCV 95.0 02/22/2017   PLT 312 02/22/2017      Chemistry      Component Value Date/Time   NA 138 02/22/2017 0906   NA 135 08/29/2014 1457   K 3.7 02/22/2017 0906   K 3.7 08/29/2014 1457   CL 107 02/22/2017 0906   CL 105 08/29/2014 1457   CO2 24 02/22/2017 0906   CO2 24 08/29/2014 1457   BUN 12 02/22/2017 0906   BUN 8 08/29/2014 1457   CREATININE 0.71 02/22/2017 0906   CREATININE 0.64 08/29/2014 1457      Component Value Date/Time   CALCIUM 9.0 02/22/2017 0906   CALCIUM 9.0 08/29/2014 1457   ALKPHOS 131 (H) 02/22/2017 0906   ALKPHOS 168 (H) 08/29/2014 1457   AST 97 (H) 02/22/2017 0906    AST 111 (H) 08/29/2014 1457   ALT 46 02/22/2017 0906   ALT 78 (H) 08/29/2014 1457   BILITOT 0.4 02/22/2017 0906   BILITOT 0.6 08/29/2014 1457     IMPRESSION: 1. 6 cm hypermetabolic mass anteriorly in the primarily collapsed right upper lobe, maximum SUV 13.8. Associated paratracheal, subcarinal, and supraclavicular metastatic adenopathy. 2. The masslike density in the right lower lobe has low-grade metabolic activity, potentially from prior radiation therapy. 3. 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 4. No findings of metastatic disease to the abdomen or pelvis.   Electronically Signed   By: Van Clines M.D.   On: 02/12/2017 12:31    RADIOGRAPHIC STUDIES: I have personally reviewed the radiological images as listed and agreed with the findings in the report. No results found.   ASSESSMENT & PLAN:  Primary cancer of bronchus of right lower lobe ([place]) # Right lung ca- with recurrence [based on imaging]- metastases to right lung ~10-75mm;  OCT 5th PET- progression of the right upper lobe primary; right supraclav LN. I would recommend biopsy of the right supraclavicular lymph node- for more tissue for molecular testing. Patient is agreeable.   # Given the progression of disease/elevated LFTs; I would recommend discontinuation of gemcitabine. Recommend single agent Abraxane. Reviewed again the potential side effects including but not limited to-increasing fatigue, nausea vomiting, diarrhea, hair loss, sores in the mouth, increase risk of infection and also neuropathy.   # Discussed with the patient that treatments are palliative not curative. The treatments would be ongoing/indefinite. Given the widespread metastatic disease/progression- I do not think patient is a candidate for radiation at this time. I tried to reach Dr.  Donella Stade. In speaking to him again.   # Alcohol abuse-  discussed with the patient that alcohol can interfere with chemotherapy; and also cause liver dysfunction. Recommend abstinence.  # COPD/bronchitis-STABLE. On advair/ albuterol.   # Hold chemotherapy today; follow-up in  one week/Abraxane chemotherapy; follow-up with me/labs chemotherapy in 2 weeks.   # 40 minutes face-to-face with the patient discussing the above plan of care; more than 50% of time spent on prognosis/ natural history; counseling and coordination.    Orders Placed This Encounter  Procedures  . [place] CORE BIOPSY (LYMPH NODES)    Standing Status:   Future    Standing Expiration Date:   04/24/2018    Order Specific Question:   Lab orders requested (DO NOT place separate lab orders, these will be automatically ordered during procedure specimen collection):    Answer:   Surgical Pathology    Order Specific Question:   Reason for Exam (SYMPTOM  OR DIAGNOSIS REQUIRED)    Answer:   Biopsy of right supraclav LN; lung cancer; biopsy for molecular testing    Order Specific Question:   Preferred imaging location?    Answer:   [facility]  . CBC with Differential    Standing Status:   Future    Standing Expiration Date:   02/22/2018  . Comprehensive metabolic panel    Standing Status:   Future    Standing Expiration Date:   02/22/2018      Cammie Sickle, MD 02/22/2017 2:31 PM

## 2017-02-22 NOTE — Progress Notes (Signed)
DISCONTINUE OFF PATHWAY REGIMEN - Non-Small Cell Lung   [id number]:Gemcitabine 1,000 mg/m2 D1, 8  q21 Days:   A cycle is every 21 days:     Gemcitabine   **Always confirm dose/schedule in your pharmacy ordering system**    REASON: Toxicities / Adverse Event PRIOR TREATMENT: Off Pathway: Gemcitabine 1,000 mg/m2 D1, 8  q21 Days TREATMENT RESPONSE: Progressive Disease (PD)  START ON PATHWAY REGIMEN - Non-Small Cell Lung     Daily:     Afatinib   **Always confirm dose/schedule in your pharmacy ordering system**    Patient Characteristics: Stage IV Metastatic, Squamous, PS = 0, 1, Third Line, Prior PD-1/PD-L1 Inhibitor or No Prior PD-1/PD-L1 Inhibitor and Not a Candidate for Immunotherapy AJCC T Category: T4 Current Disease Status: Distant Metastases AJCC N Category: N2 AJCC M Category: M1 AJCC 8 Stage Grouping: IV Histology: Squamous Cell Line of therapy: Third Line PD-L1 Expression Status: Awaiting Test Results Performance Status: PS = 0, 1 Would you be surprised if this patient died  in the next year<= I would be surprised if this patient died in the next year Immunotherapy Candidate Status: Not a Candidate for Immunotherapy Prior Immunotherapy Status: Prior PD-1/PD-L1 Inhibitor Intent of Therapy: Non-Curative / Palliative Intent, Discussed with Patient

## 2017-02-22 NOTE — Assessment & Plan Note (Addendum)
#   Right lung ca- with recurrence [based on imaging]- metastases to right lung ~10-72mm;  OCT 5th PET- progression of the right upper lobe primary; right supraclav LN. I would recommend biopsy of the right supraclavicular lymph node- for more tissue for molecular testing. Patient is agreeable.   # Given the progression of disease/elevated LFTs; I would recommend discontinuation of gemcitabine. Recommend single agent Abraxane. Reviewed again the potential side effects including but not limited to-increasing fatigue, nausea vomiting, diarrhea, hair loss, sores in the mouth, increase risk of infection and also neuropathy.   # Discussed with the patient that treatments are palliative not curative. The treatments would be ongoing/indefinite. Given the widespread metastatic disease/progression- I do not think patient is a candidate for radiation at this time. I tried to reach Dr. Donella Stade. In speaking to him again.   # Alcohol abuse-  discussed with the patient that alcohol can interfere with chemotherapy; and also cause liver dysfunction. Recommend abstinence.  # COPD/bronchitis-STABLE. On advair/ albuterol.   # Hold chemotherapy today; follow-up in one week/Abraxane chemotherapy; follow-up with me/labs chemotherapy in 2 weeks.   # 40 minutes face-to-face with the patient discussing the above plan of care; more than 50% of time spent on prognosis/ natural history; counseling and coordination.

## 2017-03-01 ENCOUNTER — Inpatient Hospital Stay: Payer: Medicare Other

## 2017-03-01 ENCOUNTER — Other Ambulatory Visit: Payer: Self-pay | Admitting: Internal Medicine

## 2017-03-01 VITALS — BP 130/86 | HR 89 | Temp 98.4°F | Resp 16

## 2017-03-01 DIAGNOSIS — Z5111 Encounter for antineoplastic chemotherapy: Secondary | ICD-10-CM | POA: Diagnosis not present

## 2017-03-01 DIAGNOSIS — C3431 Malignant neoplasm of lower lobe, right bronchus or lung: Secondary | ICD-10-CM

## 2017-03-01 MED ORDER — PACLITAXEL PROTEIN-BOUND CHEMO INJECTION 100 MG
100.0000 mg/m2 | Freq: Once | INTRAVENOUS | Status: AC
  Administered 2017-03-01: 150 mg via INTRAVENOUS
  Filled 2017-03-01: qty 30

## 2017-03-01 MED ORDER — PACLITAXEL PROTEIN-BOUND CHEMO INJECTION 100 MG
100.0000 mg/m2 | Freq: Once | Status: DC
  Filled 2017-03-01: qty 30

## 2017-03-01 MED ORDER — SODIUM CHLORIDE 0.9% FLUSH
10.0000 mL | INTRAVENOUS | Status: DC | PRN
  Filled 2017-03-01: qty 10

## 2017-03-01 MED ORDER — SODIUM CHLORIDE 0.9 % IV SOLN
Freq: Once | INTRAVENOUS | Status: AC
  Administered 2017-03-01: 10:00:00 via INTRAVENOUS
  Filled 2017-03-01: qty 1000

## 2017-03-01 MED ORDER — PROCHLORPERAZINE MALEATE 10 MG PO TABS
10.0000 mg | ORAL_TABLET | Freq: Once | ORAL | Status: AC
  Administered 2017-03-01: 10 mg via ORAL
  Filled 2017-03-01: qty 1

## 2017-03-01 MED ORDER — HEPARIN SOD (PORK) LOCK FLUSH 100 UNIT/ML IV SOLN
500.0000 [IU] | Freq: Once | INTRAVENOUS | Status: AC | PRN
  Administered 2017-03-01: 500 [IU]
  Filled 2017-03-01: qty 5

## 2017-03-01 NOTE — Progress Notes (Signed)
Notified Dr. Rogue Bussing regarding lab results that were not within parameters of treatment plan.  AST 97 and ANC 1.5.  MD wanted to proceed with scheduled treatment.

## 2017-03-08 ENCOUNTER — Inpatient Hospital Stay: Payer: Medicare Other

## 2017-03-08 ENCOUNTER — Inpatient Hospital Stay (HOSPITAL_BASED_OUTPATIENT_CLINIC_OR_DEPARTMENT_OTHER): Payer: Medicare Other | Admitting: Internal Medicine

## 2017-03-08 ENCOUNTER — Telehealth: Payer: Self-pay | Admitting: Internal Medicine

## 2017-03-08 VITALS — BP 129/78 | HR 114 | Temp 97.7°F | Resp 20 | Wt 111.6 lb

## 2017-03-08 DIAGNOSIS — F17210 Nicotine dependence, cigarettes, uncomplicated: Secondary | ICD-10-CM

## 2017-03-08 DIAGNOSIS — C3431 Malignant neoplasm of lower lobe, right bronchus or lung: Secondary | ICD-10-CM

## 2017-03-08 DIAGNOSIS — Z923 Personal history of irradiation: Secondary | ICD-10-CM

## 2017-03-08 DIAGNOSIS — Z5111 Encounter for antineoplastic chemotherapy: Secondary | ICD-10-CM | POA: Diagnosis not present

## 2017-03-08 DIAGNOSIS — D72819 Decreased white blood cell count, unspecified: Secondary | ICD-10-CM | POA: Diagnosis not present

## 2017-03-08 DIAGNOSIS — Z79899 Other long term (current) drug therapy: Secondary | ICD-10-CM

## 2017-03-08 DIAGNOSIS — F1010 Alcohol abuse, uncomplicated: Secondary | ICD-10-CM

## 2017-03-08 DIAGNOSIS — C7801 Secondary malignant neoplasm of right lung: Secondary | ICD-10-CM

## 2017-03-08 DIAGNOSIS — J449 Chronic obstructive pulmonary disease, unspecified: Secondary | ICD-10-CM

## 2017-03-08 LAB — COMPREHENSIVE METABOLIC PANEL
ALK PHOS: 105 U/L (ref 38–126)
ALT: 37 U/L (ref 14–54)
AST: 62 U/L — ABNORMAL HIGH (ref 15–41)
Albumin: 3.5 g/dL (ref 3.5–5.0)
Anion gap: 6 (ref 5–15)
BUN: 11 mg/dL (ref 6–20)
CALCIUM: 8.8 mg/dL — AB (ref 8.9–10.3)
CHLORIDE: 107 mmol/L (ref 101–111)
CO2: 24 mmol/L (ref 22–32)
CREATININE: 0.57 mg/dL (ref 0.44–1.00)
GFR calc non Af Amer: >60 mL/min (ref >60)
Glucose, Bld: 97 mg/dL (ref 65–99)
Potassium: 3.6 mmol/L (ref 3.5–5.1)
SODIUM: 137 mmol/L (ref 135–145)
Total Bilirubin: 0.7 mg/dL (ref 0.3–1.2)
Total Protein: 8.5 g/dL — ABNORMAL HIGH (ref 6.5–8.1)

## 2017-03-08 LAB — CBC WITH DIFFERENTIAL/PLATELET
Basophils Absolute: 0 10*3/uL (ref 0–0.1)
Basophils Relative: 2 %
Eosinophils Absolute: 0 10*3/uL (ref 0–0.7)
Eosinophils Relative: 1 %
HCT: 29.8 % — ABNORMAL LOW (ref 35.0–47.0)
HEMOGLOBIN: 10 g/dL — AB (ref 12.0–16.0)
LYMPHS ABS: 1 10*3/uL (ref 1.0–3.6)
LYMPHS PCT: 43 %
MCH: 31.3 pg (ref 26.0–34.0)
MCHC: 33.6 g/dL (ref 32.0–36.0)
MCV: 93.2 fL (ref 80.0–100.0)
Monocytes Absolute: 0.2 10*3/uL (ref 0.2–0.9)
Monocytes Relative: 7 %
NEUTROS PCT: 47 %
Neutro Abs: 1.2 10*3/uL — ABNORMAL LOW (ref 1.4–6.5)
Platelets: 183 10*3/uL (ref 150–440)
RBC: 3.2 MIL/uL — AB (ref 3.80–5.20)
RDW: 17.1 % — ABNORMAL HIGH (ref 11.5–14.5)
WBC: 2.4 10*3/uL — AB (ref 3.6–11.0)

## 2017-03-08 MED ORDER — SODIUM CHLORIDE 0.9% FLUSH
10.0000 mL | INTRAVENOUS | Status: DC | PRN
  Administered 2017-03-08: 10 mL
  Filled 2017-03-08: qty 10

## 2017-03-08 MED ORDER — EMPTY CONTAINERS FLEXIBLE MISC
100.0000 mg/m2 | Freq: Once | Status: DC
  Filled 2017-03-08: qty 30

## 2017-03-08 MED ORDER — HEPARIN SOD (PORK) LOCK FLUSH 100 UNIT/ML IV SOLN
500.0000 [IU] | Freq: Once | INTRAVENOUS | Status: AC | PRN
  Administered 2017-03-08: 500 [IU]
  Filled 2017-03-08: qty 5

## 2017-03-08 MED ORDER — PROCHLORPERAZINE MALEATE 10 MG PO TABS
10.0000 mg | ORAL_TABLET | Freq: Once | ORAL | Status: AC
  Administered 2017-03-08: 10 mg via ORAL
  Filled 2017-03-08: qty 1

## 2017-03-08 MED ORDER — SODIUM CHLORIDE 0.9 % IV SOLN
Freq: Once | INTRAVENOUS | Status: AC
  Administered 2017-03-08: 12:00:00 via INTRAVENOUS
  Filled 2017-03-08: qty 1000

## 2017-03-08 MED ORDER — PACLITAXEL PROTEIN-BOUND CHEMO INJECTION 100 MG
100.0000 mg/m2 | Freq: Once | INTRAVENOUS | Status: AC
Start: 2017-03-08 — End: 2017-03-08
  Administered 2017-03-08: 150 mg via INTRAVENOUS
  Filled 2017-03-08: qty 30

## 2017-03-08 NOTE — Progress Notes (Signed)
Pt asking for food bank items. Brooke CMA obtaining. Per pt she has felt congested for approx 1 mo.

## 2017-03-08 NOTE — Progress Notes (Signed)
To proceed with treatment today per MD/

## 2017-03-08 NOTE — Assessment & Plan Note (Addendum)
#   Right lung ca- with recurrence [based on imaging]- metastases to right lung ~10-58mm;  OCT 5th PET- progression of the right upper lobe primary; right supraclav LN. I would recommend biopsy of the right supraclavicular lymph node- for more tissue for molecular testing. Patient is agreeable. Discussed with Dr. Donella Stade; he agrees with systemic therapy.   #  Currently on Abraxane cycle #1 day-1 appx 1 week ago.  # Proceed with cycle #1 day-8 today; Labs today reviewed;  acceptable for treatment today- except for- ANC 1.2[see below]  # leukopenia/aNC 1.2-from chemotherapy. We will monitor closely. Hold off any growth factors at this time. If not improved in 2 weeks; would recommend adding Neulasta.  # COPD/bronchitis-STABLE. On advair/ albuterol.   # follow up in 2 weeks/labs/ chemo-abraxane.

## 2017-03-08 NOTE — Progress Notes (Signed)
[place] OFFICE PROGRESS NOTE  Patient Care Team: Center, [facility] as PCP - General (General Practice)  Cancer Staging No matching staging information was found for the patient.   Oncology History   Chief Complaint/Diagnosis:   1. 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 Faith Rogue, cardiothoracic surgeon) 2. Started on radiation and concurrent chemotherapy with carboplatinum Taxol from April of 2013 3. Finished radiation and chemotherapy with carboplatinum and Taxol in May of 2013 4. Stage IIIB (T4N3M0) NSCLC who completed a course of EBRT on Oct 07, 2011 with a total dose of 7000cGy.   5. Starting consolidation with 2 cycles of carboplatinum and Abraxane 6.patient is off chemotherapy since November of 2013.  Repeat CT scan shows stable disease(January, 2014) 7  Started been on Taxotere and  RAMICIRUMAB from June 07, 2013 8.chemotherapy with Taxotere  and  RAMUCIRUMAB discontinued in March of 2015 because of hemoptysis.  9.Started onNIVOLULAMAB April of 2015. 10NIVOLULAMABWas put on hold because of abnormal liver enzymes January of 2016  # AUG 2017- Right lung recurrence [2 nodules - ~1cm; stable lung mass/fibrosis]. START OPDIVO q 2W [small to Bx; s/p rad-onc eval]; opdivo x4- CT OCT 16th- PROGRESSION 2 lung nodules ~48mm;   # JAN 2018-carbo-TAXOL s/p 2 cycles- CT- PR; but carbo-anaphylaxis; STOPPED chemo  # July 2018- PROGRESSION- GEMCITABINE; 02/12/2017- PROGRESSION [? LFTs elevated sec to Gem; Gem Discontinued]  # MID OCT 2018- Abraxane   # MOLECULAR TESTING- ? Not enough tissue; Oct 2018- right supraclav Bx- for molecular testing.      Primary cancer of bronchus of right lower lobe (HCC)     INTERVAL HISTORY:  Jeanette Jones 61 y.o.  female pleasant patient above history of stage IV lung cancer squamous cell/ recurrent- currently on  Abraxane is here for follow-up.  Patient denies any tingling and numbness of the extremities. She denies any skin rash. No diarrhea.  Patient continues to have chronic cough chronic mild shortness of breath; chronic intermittent blood in sputum. Continues to complain of pain when coughing. Denies any headaches. Denies any nausea vomiting. Denies any skin itch.  REVIEW OF SYSTEMS:  A complete 10 point review of system is done which is negative except mentioned above/history of present illness.   PAST MEDICAL HISTORY :  Past Medical History:  Diagnosis Date  . Back pain, chronic   . Carcinoma of lung ([place]) 07/2011  . Cough   . Depression   . Dyspnea   . Emphysema of lung ([place])   . H/O tubal ligation 02/12/2017   From chart  . Heart murmur   . Hep C w/o coma, chronic ([place])   . Hypertension   . Knee pain 02/12/2017  . Lung cancer ([place])   . Shoulder pain 02/09/2017  . Swallowing difficulty     PAST SURGICAL HISTORY :   Past Surgical History:  Procedure Laterality Date  . ESOPHAGOGASTRODUODENOSCOPY N/A 06/08/2015   Procedure: ESOPHAGOGASTRODUODENOSCOPY (EGD);  Surgeon: Hulen Luster, MD;  Location: [facility] ENDOSCOPY;  Service: Gastroenterology;  Laterality: N/A;  . HEMORROIDECTOMY    . LUNG BIOPSY    . TUBAL LIGATION      FAMILY HISTORY :   Family History  Problem Relation Age of Onset  . Breast cancer Neg Hx     SOCIAL HISTORY:   Social History  Substance Use Topics  . Smoking status: Current Every Day Smoker  Packs/day: 0.25    Years: 40.00    Types: Cigarettes  . Smokeless tobacco: Never Used  . Alcohol use Yes     Comment: beer on saturday    ALLERGIES:  is allergic to carboplatin.  MEDICATIONS:  Current Outpatient Prescriptions  Medication Sig Dispense Refill  . ondansetron (ZOFRAN) 8 MG tablet Take 1 tablet (8 mg total) by mouth every 8 (eight) hours as needed for nausea or vomiting (start 3 days; after chemo). 40 tablet 1  . cyclobenzaprine (FLEXERIL) 10 MG  tablet Take 10 mg by mouth at bedtime.    . Fluticasone-Salmeterol (ADVAIR DISKUS) 500-50 MCG/DOSE AEPB Inhale 1 puff into the lungs 2 (two) times daily. (Patient not taking: Reported on 02/22/2017) 1 each 3  . lidocaine-prilocaine (EMLA) cream Apply 1 application topically as needed. To port a cath site- 1 hr prior to port access (Patient not taking: Reported on 03/08/2017) 30 g 3  . potassium chloride 20 MEQ/15ML (10%) SOLN Take 15 mLs (20 mEq total) by mouth 2 (two) times daily. (Patient not taking: Reported on 02/22/2017) 300 mL 0  . prochlorperazine (COMPAZINE) 10 MG tablet Take 1 tablet (10 mg total) by mouth every 6 (six) hours as needed for nausea or vomiting. (Patient not taking: Reported on 02/22/2017) 40 tablet 1  . traMADol (ULTRAM) 50 MG tablet Take 1 tablet (50 mg total) by mouth every 8 (eight) hours as needed. (Patient not taking: Reported on 03/08/2017) 90 tablet 0   No current facility-administered medications for this visit.     PHYSICAL EXAMINATION: ECOG PERFORMANCE STATUS: 0 - Asymptomatic  BP 129/78 (BP Location: Right Arm, Patient Position: Sitting)   Pulse (!) 114 Comment: taken manually and was 112  Temp 97.7 F (36.5 C) (Tympanic)   Resp 20   Wt 111 lb 9.6 oz (50.6 kg)   BMI 19.77 kg/m   Filed Weights   03/08/17 1105  Weight: 111 lb 9.6 oz (50.6 kg)    GENERAL: Well-nourished well-developed; Alert, no distress and comfortable.   Alone.  EYES: no pallor or icterus OROPHARYNX: no thrush or ulceration; good dentition  NECK: supple, no masses felt LYMPH:  no palpable lymphadenopathy in the cervical, axillary or inguinal regions LUNGS: Decreased air entry to auscultation bilaterally and  No wheeze or crackles HEART/CVS: regular rate & rhythm and no murmurs; No lower extremity edema ABDOMEN:abdomen soft, non-tender and normal bowel sounds Musculoskeletal:no cyanosis of digits and no clubbing  PSYCH: alert & oriented x 3 with fluent speech NEURO: no focal  motor/sensory deficits SKIN:  no rashes or significant lesions  LABORATORY DATA:  I have reviewed the data as listed    Component Value Date/Time   NA 137 03/08/2017 0951   NA 135 08/29/2014 1457   K 3.6 03/08/2017 0951   K 3.7 08/29/2014 1457   CL 107 03/08/2017 0951   CL 105 08/29/2014 1457   CO2 24 03/08/2017 0951   CO2 24 08/29/2014 1457   GLUCOSE 97 03/08/2017 0951   GLUCOSE 98 08/29/2014 1457   BUN 11 03/08/2017 0951   BUN 8 08/29/2014 1457   CREATININE 0.57 03/08/2017 0951   CREATININE 0.64 08/29/2014 1457   CALCIUM 8.8 (L) 03/08/2017 0951   CALCIUM 9.0 08/29/2014 1457   PROT 8.5 (H) 03/08/2017 0951   PROT 9.3 (H) 08/29/2014 1457   ALBUMIN 3.5 03/08/2017 0951   ALBUMIN 3.6 08/29/2014 1457   AST 62 (H) 03/08/2017 0951   AST 111 (H) 08/29/2014 1457   ALT 37  03/08/2017 0951   ALT 78 (H) 08/29/2014 1457   ALKPHOS 105 03/08/2017 0951   ALKPHOS 168 (H) 08/29/2014 1457   BILITOT 0.7 03/08/2017 0951   BILITOT 0.6 08/29/2014 1457   GFRNONAA >60 03/08/2017 0951   GFRNONAA >60 08/29/2014 1457   GFRAA >60 03/08/2017 0951   GFRAA >60 08/29/2014 1457    No results found for: SPEP, UPEP  Lab Results  Component Value Date   WBC 2.4 (L) 03/08/2017   NEUTROABS 1.2 (L) 03/08/2017   HGB 10.0 (L) 03/08/2017   HCT 29.8 (L) 03/08/2017   MCV 93.2 03/08/2017   PLT 183 03/08/2017      Chemistry      Component Value Date/Time   NA 137 03/08/2017 0951   NA 135 08/29/2014 1457   K 3.6 03/08/2017 0951   K 3.7 08/29/2014 1457   CL 107 03/08/2017 0951   CL 105 08/29/2014 1457   CO2 24 03/08/2017 0951   CO2 24 08/29/2014 1457   BUN 11 03/08/2017 0951   BUN 8 08/29/2014 1457   CREATININE 0.57 03/08/2017 0951   CREATININE 0.64 08/29/2014 1457      Component Value Date/Time   CALCIUM 8.8 (L) 03/08/2017 0951   CALCIUM 9.0 08/29/2014 1457   ALKPHOS 105 03/08/2017 0951   ALKPHOS 168 (H) 08/29/2014 1457   AST 62 (H) 03/08/2017 0951   AST 111 (H) 08/29/2014 1457   ALT 37  03/08/2017 0951   ALT 78 (H) 08/29/2014 1457   BILITOT 0.7 03/08/2017 0951   BILITOT 0.6 08/29/2014 1457     IMPRESSION: 1. 6 cm hypermetabolic mass anteriorly in the primarily collapsed right upper lobe, maximum SUV 13.8. Associated paratracheal, subcarinal, and supraclavicular metastatic adenopathy. 2. The masslike density in the right lower lobe has low-grade metabolic activity, potentially from prior radiation therapy. 3. 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 4. No findings of metastatic disease to the abdomen or pelvis.   Electronically Signed   By: Van Clines M.D.   On: 02/12/2017 12:31    RADIOGRAPHIC STUDIES: I have personally reviewed the radiological images as listed and agreed with the findings in the report. No results found.   ASSESSMENT & PLAN:  Primary cancer of bronchus of right lower lobe ([place]) # Right lung ca- with recurrence [based on imaging]- metastases to right lung ~10-90mm;  OCT 5th PET- progression of the right upper lobe primary; right supraclav LN. I would recommend biopsy of the right supraclavicular lymph node- for more tissue for molecular testing. Patient is agreeable. Discussed with Dr. Donella Stade; he agrees with systemic therapy.   #  Currently on Abraxane cycle #1 day-1 appx 1 week ago.  # Proceed with cycle #1 day-8 today; Labs today reviewed;  acceptable for treatment today- except for- ANC 1.2[see below]  # leukopenia/aNC 1.2-from chemotherapy. We will monitor closely. Hold off any growth factors at this time. If not improved in 2 weeks; would recommend adding Neulasta.  # COPD/bronchitis-STABLE. On advair/ albuterol.   # follow up in 2 weeks/labs/ chemo-abraxane.    Orders Placed This Encounter  Procedures  . CBC with Differential/Platelet    Standing Status:   Future     Standing Expiration Date:   03/08/2018  . Comprehensive metabolic panel    Standing Status:   Future    Standing Expiration Date:   03/08/2018      Lenetta Quaker  Ann Lions, MD 03/09/2017 7:54 AM

## 2017-03-19 ENCOUNTER — Emergency Department: Payer: Medicare Other

## 2017-03-19 ENCOUNTER — Inpatient Hospital Stay
Admission: EM | Admit: 2017-03-19 | Discharge: 2017-03-24 | DRG: 871 | Disposition: A | Discharge status: Home Health | Payer: Medicare Other | Attending: Internal Medicine | Admitting: Internal Medicine

## 2017-03-19 DIAGNOSIS — C7801 Secondary malignant neoplasm of right lung: Secondary | ICD-10-CM | POA: Diagnosis not present

## 2017-03-19 DIAGNOSIS — I951 Orthostatic hypotension: Secondary | ICD-10-CM | POA: Diagnosis not present

## 2017-03-19 DIAGNOSIS — Z7951 Long term (current) use of inhaled steroids: Secondary | ICD-10-CM | POA: Diagnosis not present

## 2017-03-19 DIAGNOSIS — F1010 Alcohol abuse, uncomplicated: Secondary | ICD-10-CM

## 2017-03-19 DIAGNOSIS — R112 Nausea with vomiting, unspecified: Secondary | ICD-10-CM

## 2017-03-19 DIAGNOSIS — I959 Hypotension, unspecified: Secondary | ICD-10-CM | POA: Diagnosis not present

## 2017-03-19 DIAGNOSIS — F17210 Nicotine dependence, cigarettes, uncomplicated: Secondary | ICD-10-CM | POA: Diagnosis present

## 2017-03-19 DIAGNOSIS — B182 Chronic viral hepatitis C: Secondary | ICD-10-CM | POA: Diagnosis present

## 2017-03-19 DIAGNOSIS — E876 Hypokalemia: Secondary | ICD-10-CM

## 2017-03-19 DIAGNOSIS — D72819 Decreased white blood cell count, unspecified: Secondary | ICD-10-CM | POA: Diagnosis not present

## 2017-03-19 DIAGNOSIS — J189 Pneumonia, unspecified organism: Secondary | ICD-10-CM | POA: Diagnosis present

## 2017-03-19 DIAGNOSIS — B9620 Unspecified Escherichia coli [E. coli] as the cause of diseases classified elsewhere: Secondary | ICD-10-CM | POA: Diagnosis present

## 2017-03-19 DIAGNOSIS — I10 Essential (primary) hypertension: Secondary | ICD-10-CM | POA: Diagnosis present

## 2017-03-19 DIAGNOSIS — R6520 Severe sepsis without septic shock: Secondary | ICD-10-CM

## 2017-03-19 DIAGNOSIS — W19XXXA Unspecified fall, initial encounter: Secondary | ICD-10-CM | POA: Diagnosis present

## 2017-03-19 DIAGNOSIS — R197 Diarrhea, unspecified: Secondary | ICD-10-CM

## 2017-03-19 DIAGNOSIS — N179 Acute kidney failure, unspecified: Secondary | ICD-10-CM

## 2017-03-19 DIAGNOSIS — A419 Sepsis, unspecified organism: Secondary | ICD-10-CM | POA: Diagnosis present

## 2017-03-19 DIAGNOSIS — E871 Hypo-osmolality and hyponatremia: Secondary | ICD-10-CM | POA: Diagnosis present

## 2017-03-19 DIAGNOSIS — E860 Dehydration: Secondary | ICD-10-CM

## 2017-03-19 DIAGNOSIS — R1310 Dysphagia, unspecified: Secondary | ICD-10-CM | POA: Diagnosis present

## 2017-03-19 DIAGNOSIS — R55 Syncope and collapse: Secondary | ICD-10-CM | POA: Diagnosis not present

## 2017-03-19 DIAGNOSIS — K921 Melena: Secondary | ICD-10-CM | POA: Diagnosis not present

## 2017-03-19 DIAGNOSIS — J449 Chronic obstructive pulmonary disease, unspecified: Secondary | ICD-10-CM

## 2017-03-19 DIAGNOSIS — E878 Other disorders of electrolyte and fluid balance, not elsewhere classified: Secondary | ICD-10-CM | POA: Diagnosis not present

## 2017-03-19 DIAGNOSIS — F1210 Cannabis abuse, uncomplicated: Secondary | ICD-10-CM

## 2017-03-19 DIAGNOSIS — Z888 Allergy status to other drugs, medicaments and biological substances status: Secondary | ICD-10-CM | POA: Diagnosis not present

## 2017-03-19 DIAGNOSIS — R109 Unspecified abdominal pain: Secondary | ICD-10-CM | POA: Diagnosis not present

## 2017-03-19 DIAGNOSIS — C3431 Malignant neoplasm of lower lobe, right bronchus or lung: Secondary | ICD-10-CM

## 2017-03-19 DIAGNOSIS — C3490 Malignant neoplasm of unspecified part of unspecified bronchus or lung: Secondary | ICD-10-CM | POA: Diagnosis present

## 2017-03-19 DIAGNOSIS — Z923 Personal history of irradiation: Secondary | ICD-10-CM

## 2017-03-19 DIAGNOSIS — Z79899 Other long term (current) drug therapy: Secondary | ICD-10-CM

## 2017-03-19 DIAGNOSIS — Z8619 Personal history of other infectious and parasitic diseases: Secondary | ICD-10-CM | POA: Diagnosis not present

## 2017-03-19 DIAGNOSIS — N390 Urinary tract infection, site not specified: Secondary | ICD-10-CM | POA: Diagnosis present

## 2017-03-19 DIAGNOSIS — F329 Major depressive disorder, single episode, unspecified: Secondary | ICD-10-CM

## 2017-03-19 DIAGNOSIS — F1410 Cocaine abuse, uncomplicated: Secondary | ICD-10-CM

## 2017-03-19 DIAGNOSIS — K222 Esophageal obstruction: Secondary | ICD-10-CM | POA: Diagnosis present

## 2017-03-19 LAB — URINALYSIS, COMPLETE (UACMP) WITH MICROSCOPIC
Bilirubin Urine: NEGATIVE
Glucose, UA: NEGATIVE mg/dL
Hgb urine dipstick: NEGATIVE
Ketones, ur: NEGATIVE mg/dL
Leukocytes, UA: NEGATIVE
Nitrite: NEGATIVE
Protein, ur: 100 mg/dL — AB
Specific Gravity, Urine: 1.013 (ref 1.005–1.030)
pH: 5 (ref 5.0–8.0)

## 2017-03-19 LAB — COMPREHENSIVE METABOLIC PANEL
ALBUMIN: 3.4 g/dL — AB (ref 3.5–5.0)
ALT: 38 U/L (ref 14–54)
ANION GAP: 15 (ref 5–15)
AST: 80 U/L — ABNORMAL HIGH (ref 15–41)
Alkaline Phosphatase: 67 U/L (ref 38–126)
BUN: 79 mg/dL — ABNORMAL HIGH (ref 6–20)
CHLORIDE: 90 mmol/L — AB (ref 101–111)
CO2: 16 mmol/L — AB (ref 22–32)
Calcium: 8.7 mg/dL — ABNORMAL LOW (ref 8.9–10.3)
Creatinine, Ser: 2.22 mg/dL — ABNORMAL HIGH (ref 0.44–1.00)
GFR calc non Af Amer: 23 mL/min — ABNORMAL LOW (ref >60)
GFR, EST AFRICAN AMERICAN: 27 mL/min — AB (ref >60)
GLUCOSE: 99 mg/dL (ref 65–99)
POTASSIUM: 2.4 mmol/L — AB (ref 3.5–5.1)
SODIUM: 121 mmol/L — AB (ref 135–145)
Total Bilirubin: 0.6 mg/dL (ref 0.3–1.2)
Total Protein: 9.5 g/dL — ABNORMAL HIGH (ref 6.5–8.1)

## 2017-03-19 LAB — MAGNESIUM: Magnesium: 2.3 mg/dL (ref 1.7–2.4)

## 2017-03-19 LAB — CBC WITH DIFFERENTIAL/PLATELET
BAND NEUTROPHILS: 0 %
BASOS PCT: 0 %
Basophils Absolute: 0 10*3/uL (ref 0–0.1)
Blasts: 0 %
EOS ABS: 0 10*3/uL (ref 0–0.7)
EOS PCT: 0 %
HCT: 33 % — ABNORMAL LOW (ref 35.0–47.0)
Hemoglobin: 11.5 g/dL — ABNORMAL LOW (ref 12.0–16.0)
LYMPHS ABS: 0.9 10*3/uL — AB (ref 1.0–3.6)
LYMPHS PCT: 19 %
MCH: 31.3 pg (ref 26.0–34.0)
MCHC: 34.9 g/dL (ref 32.0–36.0)
MCV: 89.6 fL (ref 80.0–100.0)
MONO ABS: 1.1 10*3/uL — AB (ref 0.2–0.9)
MYELOCYTES: 0 %
Metamyelocytes Relative: 0 %
Monocytes Relative: 24 %
NEUTROS PCT: 56 %
NRBC: 0 /100{WBCs}
Neutro Abs: 2.6 10*3/uL (ref 1.4–6.5)
OTHER: 1 %
PLATELETS: 326 10*3/uL (ref 150–440)
Promyelocytes Absolute: 0 %
RBC: 3.68 MIL/uL — ABNORMAL LOW (ref 3.80–5.20)
RDW: 17.1 % — ABNORMAL HIGH (ref 11.5–14.5)
WBC: 4.6 10*3/uL (ref 3.6–11.0)

## 2017-03-19 LAB — PROCALCITONIN: PROCALCITONIN: 1.06 ng/mL

## 2017-03-19 LAB — PROTIME-INR
INR: 1.16
Prothrombin Time: 14.7 s (ref 11.4–15.2)

## 2017-03-19 LAB — INFLUENZA PANEL BY PCR (TYPE A & B)
Influenza A By PCR: NEGATIVE
Influenza B By PCR: NEGATIVE

## 2017-03-19 LAB — TROPONIN I: Troponin I: <0.03 ng/mL

## 2017-03-19 LAB — HEMOGLOBIN A1C
Hgb A1c MFr Bld: 5.4 % (ref 4.8–5.6)
Mean Plasma Glucose: 108.28 mg/dL

## 2017-03-19 LAB — LACTIC ACID, PLASMA
Lactic Acid, Venous: 2.5 mmol/L (ref 0.5–1.9)
Lactic Acid, Venous: 0.7 mmol/L (ref 0.5–1.9)

## 2017-03-19 LAB — LIPASE, BLOOD: Lipase: 193 U/L — ABNORMAL HIGH (ref 11–51)

## 2017-03-19 LAB — TSH: TSH: 0.728 u[IU]/mL (ref 0.350–4.500)

## 2017-03-19 MED ORDER — POTASSIUM CHLORIDE 10 MEQ/100ML IV SOLN
10.0000 meq | INTRAVENOUS | Status: AC
  Administered 2017-03-19 (×6): 10 meq via INTRAVENOUS
  Filled 2017-03-19 (×6): qty 100

## 2017-03-19 MED ORDER — SODIUM CHLORIDE 0.9 % IV BOLUS (SEPSIS)
500.0000 mL | Freq: Once | INTRAVENOUS | Status: AC
  Administered 2017-03-19: 500 mL via INTRAVENOUS

## 2017-03-19 MED ORDER — DOCUSATE SODIUM 100 MG PO CAPS
100.0000 mg | ORAL_CAPSULE | Freq: Two times a day (BID) | ORAL | Status: DC
  Filled 2017-03-19: qty 1

## 2017-03-19 MED ORDER — SODIUM CHLORIDE 0.9 % IV SOLN
INTRAVENOUS | Status: DC
  Administered 2017-03-19: 04:00:00 via INTRAVENOUS

## 2017-03-19 MED ORDER — ADULT MULTIVITAMIN LIQUID CH
15.0000 mL | Freq: Every day | ORAL | Status: DC
  Administered 2017-03-19 – 2017-03-24 (×5): 15 mL via ORAL
  Filled 2017-03-19 (×6): qty 15

## 2017-03-19 MED ORDER — ONDANSETRON HCL 4 MG/2ML IJ SOLN
4.0000 mg | Freq: Four times a day (QID) | INTRAMUSCULAR | Status: DC | PRN
  Administered 2017-03-19: 17:00:00 4 mg via INTRAVENOUS
  Filled 2017-03-19: qty 2

## 2017-03-19 MED ORDER — ALUM & MAG HYDROXIDE-SIMETH 200-200-20 MG/5ML PO SUSP
15.0000 mL | Freq: Four times a day (QID) | ORAL | Status: DC | PRN
  Administered 2017-03-22: 15 mL via ORAL
  Filled 2017-03-19: qty 30

## 2017-03-19 MED ORDER — ACETAMINOPHEN 500 MG PO TABS
1000.0000 mg | ORAL_TABLET | Freq: Once | ORAL | Status: AC
  Administered 2017-03-19: 1000 mg via ORAL
  Filled 2017-03-19: qty 2

## 2017-03-19 MED ORDER — MAGNESIUM SULFATE 2 GM/50ML IV SOLN
2.0000 g | Freq: Once | INTRAVENOUS | Status: AC
  Administered 2017-03-19: 2 g via INTRAVENOUS
  Filled 2017-03-19: qty 50

## 2017-03-19 MED ORDER — ONDANSETRON HCL 4 MG PO TABS: 4.0000 mg | ORAL_TABLET | Freq: Four times a day (QID) | ORAL | Status: DC | PRN

## 2017-03-19 MED ORDER — PIPERACILLIN-TAZOBACTAM 3.375 G IVPB
3.3750 g | Freq: Three times a day (TID) | INTRAVENOUS | Status: DC
  Administered 2017-03-19 – 2017-03-22 (×9): 3.375 g via INTRAVENOUS
  Filled 2017-03-19 (×9): qty 50

## 2017-03-19 MED ORDER — SODIUM CHLORIDE 0.9 % IV BOLUS (SEPSIS)
2000.0000 mL | Freq: Once | INTRAVENOUS | Status: AC
  Administered 2017-03-19: 2000 mL via INTRAVENOUS

## 2017-03-19 MED ORDER — POTASSIUM CHLORIDE IN NACL 40-0.9 MEQ/L-% IV SOLN
INTRAVENOUS | Status: DC
  Administered 2017-03-19 – 2017-03-21 (×8): 125 mL/h via INTRAVENOUS
  Administered 2017-03-22: 17:00:00 75 mL/h via INTRAVENOUS
  Administered 2017-03-22: 125 mL/h via INTRAVENOUS
  Administered 2017-03-23: 22:00:00 50 mL/h via INTRAVENOUS
  Administered 2017-03-23: 75 mL/h via INTRAVENOUS
  Filled 2017-03-19 (×15): qty 1000

## 2017-03-19 MED ORDER — ENSURE COMPLETE PO LIQD: 237.0000 mL | Freq: Two times a day (BID) | ORAL | 0 refills | Status: AC

## 2017-03-19 MED ORDER — HEPARIN SODIUM (PORCINE) 5000 UNIT/ML IJ SOLN: 5000.0000 [IU] | Freq: Three times a day (TID) | INTRAMUSCULAR | Status: DC

## 2017-03-19 MED ORDER — ACETAMINOPHEN 325 MG PO TABS
650.0000 mg | ORAL_TABLET | Freq: Four times a day (QID) | ORAL | Status: DC | PRN
Start: 2017-03-19 — End: 2017-03-24
  Administered 2017-03-21: 650 mg via ORAL
  Filled 2017-03-19: qty 2

## 2017-03-19 MED ORDER — VANCOMYCIN HCL IN DEXTROSE 750-5 MG/150ML-% IV SOLN: 750.0000 mg | INTRAVENOUS | Status: DC

## 2017-03-19 MED ORDER — ENSURE ENLIVE PO LIQD
237.0000 mL | Freq: Four times a day (QID) | ORAL | Status: DC
  Administered 2017-03-20 – 2017-03-24 (×9): 237 mL via ORAL

## 2017-03-19 MED ORDER — MOMETASONE FURO-FORMOTEROL FUM 200-5 MCG/ACT IN AERO
2.0000 | INHALATION_SPRAY | Freq: Two times a day (BID) | RESPIRATORY_TRACT | Status: DC
  Administered 2017-03-19 – 2017-03-24 (×11): 2 via RESPIRATORY_TRACT
  Filled 2017-03-19 (×2): qty 8.8

## 2017-03-19 MED ORDER — IBUPROFEN 600 MG PO TABS
600.0000 mg | ORAL_TABLET | Freq: Once | ORAL | Status: AC
  Administered 2017-03-19: 600 mg via ORAL
  Filled 2017-03-19: qty 1

## 2017-03-19 MED ORDER — PIPERACILLIN-TAZOBACTAM 3.375 G IVPB 30 MIN
3.3750 g | Freq: Once | INTRAVENOUS | Status: AC
  Administered 2017-03-19: 3.375 g via INTRAVENOUS
  Filled 2017-03-19: qty 50

## 2017-03-19 MED ORDER — ACETAMINOPHEN 650 MG RE SUPP: 650.0000 mg | Freq: Four times a day (QID) | RECTAL | Status: DC | PRN

## 2017-03-19 MED ORDER — CYCLOBENZAPRINE HCL 10 MG PO TABS
10.0000 mg | ORAL_TABLET | Freq: Every day | ORAL | Status: DC
  Administered 2017-03-21 – 2017-03-23 (×2): 10 mg via ORAL
  Filled 2017-03-19 (×5): qty 1

## 2017-03-19 MED ORDER — PIPERACILLIN-TAZOBACTAM 3.375 G IVPB
3.3750 g | Freq: Three times a day (TID) | INTRAVENOUS | Status: DC
  Administered 2017-03-19: 3.375 g via INTRAVENOUS
  Filled 2017-03-19: qty 50

## 2017-03-19 MED ORDER — VANCOMYCIN HCL IN DEXTROSE 1-5 GM/200ML-% IV SOLN
1000.0000 mg | Freq: Once | INTRAVENOUS | Status: AC
  Administered 2017-03-19: 1000 mg via INTRAVENOUS
  Filled 2017-03-19: qty 200

## 2017-03-19 MED ORDER — POTASSIUM CHLORIDE CRYS ER 20 MEQ PO TBCR: 40.0000 meq | EXTENDED_RELEASE_TABLET | Freq: Two times a day (BID) | ORAL | Status: DC

## 2017-03-19 NOTE — Consult Note (Signed)
[place] NOTE  Patient Care Team: Center, [facility] as PCP - General (General Practice)  CHIEF COMPLAINTS/PURPOSE OF CONSULTATION:  Metastatic lung cancer  HISTORY OF PRESENTING ILLNESS:  Jeanette Jones 61 y.o.  female patient with metastatic lung cancer squamous cell- currently on Abraxane chemotherapy is currently admitted to the hospital for syncopal episode.  Given the recent progression on gemcitabine patient was started on Abraxane single agent in mid October 2018. Patient stated that few days after chemotherapy she noted to have significant nausea vomiting/poor by mouth intake. She also had "significant" diarrhea.  Patient states on the day of admission she passed out in the house; she felt dizzy and lost consciousness. In the emergency room patient was found to be hypotensive- resuscitated with fluids. Patient was found to have acute renal failure- with a creatinine of 2.2 severe hypokalemia potassium 2.4 and a sodium of 121. CT of the brain noncontrast negative for metastases. Patient is currently on antibiotics for presumed sepsis.   Patient seems to be slightly improved since admission the hospital. She continues to complain of difficulty swallowing-which is chronic. Diarrhea is improved.  ROS: Poor appetite. Positive for weight loss. A complete 10 point review of system is done which is negative except mentioned above in history of present illness  MEDICAL HISTORY:  Past Medical History:  Diagnosis Date  . Back pain, chronic   . Carcinoma of lung ([place]) 07/2011  . Cough   . Depression   . Dyspnea   . Emphysema of lung ([place])   . H/O tubal ligation 02/12/2017   From chart  . Heart murmur   . Hep C w/o coma, chronic ([place])   . Hypertension   . Knee pain 02/12/2017  . Lung cancer ([place])   . Shoulder pain 02/09/2017  . Swallowing difficulty     SURGICAL HISTORY: Past Surgical History:  Procedure Laterality Date  .  ESOPHAGOGASTRODUODENOSCOPY N/A 06/08/2015   Procedure: ESOPHAGOGASTRODUODENOSCOPY (EGD);  Surgeon: Hulen Luster, MD;  Location: [facility] ENDOSCOPY;  Service: Gastroenterology;  Laterality: N/A;  . HEMORROIDECTOMY    . LUNG BIOPSY    . TUBAL LIGATION      SOCIAL HISTORY: Social History   Social History  . Marital status: Divorced    Spouse name: N/A  . Number of children: N/A  . Years of education: N/A   Occupational History  . Not on file.   Social History Main Topics  . Smoking status: Current Every Day Smoker    Packs/day: 0.25    Years: 40.00    Types: Cigarettes  . Smokeless tobacco: Never Used  . Alcohol use Yes     Comment: beer on saturday  . Drug use: Yes    Types: Cocaine, Marijuana     Comment: 2-3 days ago; dr Randa Lynn notified  . Sexual activity: Not on file   Other Topics Concern  . Not on file   Social History Narrative  . No narrative on file    FAMILY HISTORY: Family History  Problem Relation Age of Onset  . Hypertension Other   . Breast cancer Neg Hx     ALLERGIES:  is allergic to carboplatin.  MEDICATIONS:  Current Facility-Administered Medications  Medication Dose Route Frequency Provider Last Rate Last Dose  . 0.9 % NaCl with KCl 40 mEq / L  infusion   Intravenous Continuous Vaughan Basta, MD 125 mL/hr at 03/19/17 1346 125 mL/hr at 03/19/17 1346  . acetaminophen (TYLENOL) tablet 650  mg  650 mg Oral Q6H PRN Harrie Foreman, MD       Or  . acetaminophen (TYLENOL) suppository 650 mg  650 mg Rectal Q6H PRN Harrie Foreman, MD      . cyclobenzaprine (FLEXERIL) tablet 10 mg  10 mg Oral QHS Harrie Foreman, MD      . feeding supplement (ENSURE ENLIVE) (ENSURE ENLIVE) liquid 237 mL  237 mL Oral QID Vaughan Basta, MD      . heparin injection 5,000 Units  5,000 Units Subcutaneous Q8H Harrie Foreman, MD      . mometasone-formoterol [facility]) 200-5 MCG/ACT inhaler 2 puff  2 puff Inhalation BID Harrie Foreman, MD   2 puff at  03/19/17 [phone]  . multivitamin liquid 15 mL  15 mL Oral Daily Vaughan Basta, MD      . ondansetron [facility]) tablet 4 mg  4 mg Oral Q6H PRN Harrie Foreman, MD       Or  . ondansetron [facility]) injection 4 mg  4 mg Intravenous Q6H PRN Harrie Foreman, MD   4 mg at 03/19/17 1706  . piperacillin-tazobactam (ZOSYN) IVPB 3.375 g  3.375 g Intravenous Q8H Vaughan Basta, MD          .  PHYSICAL EXAMINATION:  Vitals:   03/19/17 0420 03/19/17 0521  BP:  (!) 89/62  Pulse:  77  Resp:  18  Temp: 98.3 F (36.8 C) (!) 97.5 F (36.4 C)  SpO2:  100%   Filed Weights   03/19/17 0030 03/19/17 0521  Weight: 111 lb (50.3 kg) 106 lb 12.8 oz (48.4 kg)    GENERAL: Cachectic appearing female patient Alert, no distress and comfortable.   Alone. EYES: no pallor or icterus OROPHARYNX: no thrush or ulceration. NECK: supple, no masses felt LYMPH:  no palpable lymphadenopathy in the cervical, axillary or inguinal regions LUNGS: decreased breath sounds to auscultation at bases and  No wheeze or crackles HEART/CVS: regular rate & rhythm and no murmurs; No lower extremity edema ABDOMEN: abdomen soft, non-tender and normal bowel sounds Musculoskeletal:no cyanosis of digits and no clubbing  PSYCH: alert & oriented x 3 with fluent speech NEURO: no focal motor/sensory deficits SKIN:  no rashes or significant lesions  LABORATORY DATA:  I have reviewed the data as listed Lab Results  Component Value Date   WBC 4.6 03/19/2017   HGB 11.5 (L) 03/19/2017   HCT 33.0 (L) 03/19/2017   MCV 89.6 03/19/2017   PLT 326 03/19/2017    Recent Labs  02/22/17 0906 03/08/17 0951 03/19/17 0040  NA 138 137 121*  K 3.7 3.6 2.4*  CL 107 107 90*  CO2 24 24 16*  GLUCOSE 97 97 99  BUN 12 11 79*  CREATININE 0.71 0.57 2.22*  CALCIUM 9.0 8.8* 8.7*  GFRNONAA >60 >60 23*  GFRAA >60 >60 27*  PROT 8.5* 8.5* 9.5*  ALBUMIN 3.7 3.5 3.4*  AST 97* 62* 80*  ALT 46 37 38  ALKPHOS 131* 105 67  BILITOT  0.4 0.7 0.6    RADIOGRAPHIC STUDIES: I have personally reviewed the radiological images as listed and agreed with the findings in the report. Ct Head Wo Contrast  Result Date: 03/19/2017 CLINICAL DATA:  New onset seizure.  Syncope.  Active lung cancer. EXAM: CT HEAD WITHOUT CONTRAST TECHNIQUE: Contiguous axial images were obtained from the base of the skull through the vertex without intravenous contrast. COMPARISON:  No recent exams.  Remote head CT 02/07/2008 reviewed. FINDINGS: Brain:  No intracranial hemorrhage, mass effect, or midline shift. No hydrocephalus. The basilar cisterns are patent. No evidence of territorial infarct or acute ischemia. No extra-axial or intracranial fluid collection. Vascular: Atherosclerosis of skullbase vasculature without hyperdense vessel or abnormal calcification. Skull: No fracture or suspicious lesion. Sclerotic focus about the inner table of the left temporal bone was seen on prior PET-CT 04/15/2015 and of doubtful clinical significance. Sinuses/Orbits: Paranasal sinuses and mastoid air cells are clear. The visualized orbits are unremarkable. Other: None. IMPRESSION: No evidence of acute abnormality or intracranial metastatic disease on CT. Consider brain MRI with contrast to assess for occult intracranial metastatic disease based on clinical concern. Electronically Signed   By: Jeb Levering M.D.   On: 03/19/2017 02:43   Dg Chest Port 1 View  Result Date: 03/19/2017 CLINICAL DATA:  61 year old female with sepsis. History of right lower lobe lung cancer with chemotherapy. EXAM: PORTABLE CHEST 1 VIEW COMPARISON:  PET-CT dated 02/12/2017 FINDINGS: Left pectoral Port-A-Cath with tip over the central SVC close the cavoatrial junction. Focal area of opacity in the right upper lobe/ right suprahilar region noted corresponding to the mass seen on the prior PET-CT. Faint area of density projecting over the medial right lower lung field corresponds to the mass like density  seen in the right lower lobe. The left lung is clear. There is no pleural effusion or pneumothorax. The cardiac silhouette is within normal limits. No acute osseous pathology. IMPRESSION: Right upper and lower lobe opacities corresponding to the masses seen on the prior PET-CT. No new consolidative change. Electronically Signed   By: Anner Crete M.D.   On: 03/19/2017 01:02    ASSESSMENT & PLAN:  61 year old female patient with metastatic lung cancer currently on single agent Abraxane is admitted to the hospital for-presumed sepsis  # Metastatic squamous cell lung cancer status post multiple lines of therapy- most recently on Abraxane single agent currently status post cycle #1. Patient seems to be tolerating chemotherapy poorly- significant diarrhea/nausea vomiting- acute renal failure/severe electrolyte abnormalities. Recommend holding further chemotherapy at this time.  # Acute renal failure/severe electrolyte abnormalities/hypotension- likely secondary to prerenal causes/diarrhea poor by mouth intake. Agree with IV fluids. Electrolyte replacement. Check cortisol TSH- as patient has been previously been treated with immunotherapy.  # Sepsis- infectious workup is pending; antibiotics-on Zosyn as per primary team.  Thank you Dr.Vachhani for allowing me to participate in the care of your pleasant patient. Please do not hesitate to contact me with questions or concerns in the interim.     Cammie Sickle, MD 03/19/2017 6:28 PM

## 2017-03-19 NOTE — ED Notes (Signed)
Spoke with Jeanette Jones and MD Marcille Blanco about patient's BP.

## 2017-03-19 NOTE — ED Notes (Signed)
Report off to Jenna RN 

## 2017-03-19 NOTE — ED Notes (Signed)
Report off to jenna rn  

## 2017-03-19 NOTE — Progress Notes (Signed)
Pharmacy Antibiotic Note  Jeanette Jones is a 61 y.o. female admitted on 03/19/2017 with Post Obstructive PNA. Pharmacy has been consulted for Zosyn dosing.  Plan: Continue Zosyn 3.375g IV q8h (4 hour infusion).  Will monitor renal function and adjust dose as needed.   Height: 5\' 4"  (162.6 cm) Weight: 106 lb 12.8 oz (48.4 kg) IBW/kg (Calculated) : 54.7  Temp (24hrs), Avg:98.8 F (37.1 C), Min:97.5 F (36.4 C), Max:100.6 F (38.1 C)   Recent Labs Lab 03/19/17 0040 03/19/17 0041 03/19/17 0349  WBC  --  4.6  --   CREATININE 2.22*  --   --   LATICACIDVEN  --  2.5* 0.7    Estimated Creatinine Clearance: 20.8 mL/min (A) (by C-G formula based on SCr of 2.22 mg/dL (H)).    Allergies  Allergen Reactions  . Carboplatin Anaphylaxis    Antimicrobials this admission: Vancomycin, 11/2 /11/2 Zosyn 11/2  >>   Dose adjustments this admission:   Microbiology results: 11/2 BCx: pending 11/2 UCx: pending    11/2 UA: pending 11/2 CXR: R upper and lower opacities Thank you for allowing pharmacy to be a part of this patient's care.  Pernell Dupre, PharmD, BCPS Clinical Pharmacist 03/19/2017 1:00 PM

## 2017-03-19 NOTE — Progress Notes (Signed)
PT refusing zofran at this time. She stated that she'll let me, nurse if she wants it wants it. Continue to monitor

## 2017-03-19 NOTE — Progress Notes (Signed)
PT Cancellation Note  Patient Details Name: Jeanette Jones MRN: [id number] DOB: 01/01/1956   Cancelled Treatment:    Reason Eval/Treat Not Completed: Medical issues which prohibited therapy On chart review with last documented BP of 89/62, PT took another supine reading and it was 89/52.  Spoke with nursing and when PT came back later she reports she is giving a bolus but that BP has still been low. Will hold PT for today and try again tomorrow.  Kreg Shropshire, DPT 03/19/2017, 5:12 PM

## 2017-03-19 NOTE — ED Provider Notes (Signed)
[facility] Emergency Department Provider Note  ____________________________________________   First MD Initiated Contact with Patient 03/19/17 [phone]     (approximate)  I have reviewed the triage vital signs and the nursing notes.   HISTORY  Chief Complaint Loss of Consciousness  Level 5 exemption history limited by the patient's clinical condition  HPI Jeanette Jones is a 61 y.o. female who self presents to the emergency department after suffering a syncopal episode earlier today.  The patient is amnestic to the event, although according to family who is not there at the time the patient reportedly felt weak lightheaded and collapsed to the floor.  She was unconscious somewhere between 5 and 8 minutes and was "foaming at the mouth".  She was also incontinent to urine.  Unclear if she actually had a seizure.  The patient has stage IV lung cancer and is currently undergoing chemotherapy and radiation therapy.  The patient reports having a fever at home but is not sure for how long.  She does report gradual onset of increasing cough.  Past Medical History:  Diagnosis Date  . Back pain, chronic   . Carcinoma of lung ([place]) 07/2011  . Cough   . Depression   . Dyspnea   . Emphysema of lung ([place])   . H/O tubal ligation 02/12/2017   From chart  . Heart murmur   . Hep C w/o coma, chronic ([place])   . Hypertension   . Knee pain 02/12/2017  . Lung cancer ([place])   . Shoulder pain 02/09/2017  . Swallowing difficulty     Patient Active Problem List   Diagnosis Date Noted  . Bilateral chronic knee pain 01/11/2017  . Chemotherapy induced neutropenia ([place]) 06/29/2016  . Counseling regarding goals of care 05/22/2016  . Other iron deficiency anemia 02/10/2016  . Primary cancer of bronchus of right lower lobe ([place]) 12/27/2015  . Dysphagia 12/27/2015  . Hep C w/o coma, chronic ([place])   . Back pain, chronic   . Swallowing difficulty     Past Surgical History:    Procedure Laterality Date  . ESOPHAGOGASTRODUODENOSCOPY N/A 06/08/2015   Procedure: ESOPHAGOGASTRODUODENOSCOPY (EGD);  Surgeon: Hulen Luster, MD;  Location: [facility] ENDOSCOPY;  Service: Gastroenterology;  Laterality: N/A;  . HEMORROIDECTOMY    . LUNG BIOPSY    . TUBAL LIGATION      Prior to Admission medications   Medication Sig Start Date End Date Taking? Authorizing Provider  cyclobenzaprine (FLEXERIL) 10 MG tablet Take 10 mg by mouth at bedtime.   Yes [provider]  Fluticasone-Salmeterol (ADVAIR DISKUS) 500-50 MCG/DOSE AEPB Inhale 1 puff into the lungs 2 (two) times daily. 05/22/16  Yes Cammie Sickle, MD  ondansetron (ZOFRAN) 8 MG tablet Take 1 tablet (8 mg total) by mouth every 8 (eight) hours as needed for nausea or vomiting (start 3 days; after chemo). 05/22/16  Yes Cammie Sickle, MD  prochlorperazine (COMPAZINE) 10 MG tablet Take 1 tablet (10 mg total) by mouth every 6 (six) hours as needed for nausea or vomiting. 05/22/16  Yes Cammie Sickle, MD  lidocaine-prilocaine (EMLA) cream Apply 1 application topically as needed. To port a cath site- 1 hr prior to port access Patient not taking: Reported on 03/08/2017 04/03/16   Cammie Sickle, MD  potassium chloride 20 MEQ/15ML (10%) SOLN Take 15 mLs (20 mEq total) by mouth 2 (two) times daily. Patient not taking: Reported on 02/22/2017 12/07/16   Cammie Sickle, MD  traMADol (  ULTRAM) 50 MG tablet Take 1 tablet (50 mg total) by mouth every 8 (eight) hours as needed. Patient not taking: Reported on 03/08/2017 01/11/17   Cammie Sickle, MD    Allergies Carboplatin  Family History  Problem Relation Age of Onset  . Breast cancer Neg Hx     Social History Social History  Substance Use Topics  . Smoking status: Current Every Day Smoker    Packs/day: 0.25    Years: 40.00    Types: Cigarettes  . Smokeless tobacco: Never Used  . Alcohol use Yes     Comment: beer on saturday    Review of  Systems 5 exemption history limited by the patient's clinical condition  ____________________________________________   PHYSICAL EXAM:  VITAL SIGNS: ED Triage Vitals  Enc Vitals Group     BP 03/19/17 0034 (!) 82/60     Pulse Rate 03/19/17 0034 (!) 117     Resp 03/19/17 0034 (!) 22     Temp 03/19/17 0034 (!) 100.6 F (38.1 C)     Temp Source 03/19/17 0034 Oral     SpO2 03/19/17 0034 99 %     Weight 03/19/17 0030 111 lb (50.3 kg)     Height --      Head Circumference --      Peak Flow --      Pain Score 03/19/17 0029 0     Pain Loc --      Pain Edu? --      Excl. in [place]? --     Constitutional: Appears uncomfortable speaking in short sentences with elevated respiratory rate no diaphoresis Eyes: PERRL EOMI. Head: Atraumatic. Nose: No congestion/rhinnorhea. Mouth/Throat: No trismus Neck: No stridor.   Cardiovascular: Tachycardic rate, regular rhythm. Grossly normal heart sounds.  Good peripheral circulation. Respiratory: Increased respiratory effort.  No retractions. Lungs CTAB and moving good air Gastrointestinal: Soft nontender Musculoskeletal: No lower extremity edema   Neurologic:  No gross focal neurologic deficits are appreciated. Skin:  Skin is warm, dry and intact. No rash noted. Psychiatric: Mood and affect are normal. Speech and behavior are normal.    ____________________________________________   DIFFERENTIAL includes but not limited to  Sepsis, pneumonia, urinary tract infection, seizure, brain metastases ____________________________________________   LABS (all labs ordered are listed, but only abnormal results are displayed)  Labs Reviewed  URINALYSIS, COMPLETE (UACMP) WITH MICROSCOPIC - Abnormal; Notable for the following:       Result Value   Color, Urine AMBER (*)    APPearance CLOUDY (*)    Protein, ur 100 (*)    Bacteria, UA MANY (*)    Squamous Epithelial / LPF 0-5 (*)    All other components within normal limits  LACTIC ACID, PLASMA -  Abnormal; Notable for the following:    Lactic Acid, Venous 2.5 (*)    All other components within normal limits  CBC WITH DIFFERENTIAL/PLATELET - Abnormal; Notable for the following:    RBC 3.68 (*)    Hemoglobin 11.5 (*)    HCT 33.0 (*)    RDW 17.1 (*)    Lymphs Abs 0.9 (*)    Monocytes Absolute 1.1 (*)    All other components within normal limits  COMPREHENSIVE METABOLIC PANEL - Abnormal; Notable for the following:    Sodium 121 (*)    Potassium 2.4 (*)    Chloride 90 (*)    CO2 16 (*)    BUN 79 (*)    Creatinine, Ser 2.22 (*)  Calcium 8.7 (*)    Total Protein 9.5 (*)    Albumin 3.4 (*)    AST 80 (*)    GFR calc non Af Amer 23 (*)    GFR calc Af Amer 27 (*)    All other components within normal limits  LIPASE, BLOOD - Abnormal; Notable for the following:    Lipase 193 (*)    All other components within normal limits  CULTURE, BLOOD (ROUTINE X 2)  CULTURE, BLOOD (ROUTINE X 2)  URINE CULTURE  TROPONIN I  PROCALCITONIN  INFLUENZA PANEL BY PCR (TYPE A & B)  PROTIME-INR  LACTIC ACID, PLASMA  CBG MONITORING, ED    Blood work reviewed and interpreted by me shows an elevated lactic acid concerning for sepsis __________________________________________  EKG  ED ECG REPORT I, Darel Hong, the attending physician, personally viewed and interpreted this ECG.  Date: 03/19/2017 EKG Time:  Rate: 119 Rhythm: normal sinus rhythm QRS Axis: normal Intervals: normal ST/T Wave abnormalities: normal Narrative Interpretation: no evidence of acute ischemia  ____________________________________________  RADIOLOGY  Chest x-ray reviewed by me with no acute disease ____________________________________________   PROCEDURES  Procedure(s) performed: no  Procedures  Critical Care performed: yes  CRITICAL CARE Performed by: Darel Hong   Total critical care time: 35 minutes  Critical care time was exclusive of separately billable procedures and treating other  patients.  Critical care was necessary to treat or prevent imminent or life-threatening deterioration.  Critical care was time spent personally by me on the following activities: development of treatment plan with patient and/or surrogate as well as nursing, discussions with consultants, evaluation of patient's response to treatment, examination of patient, obtaining history from patient or surrogate, ordering and performing treatments and interventions, ordering and review of laboratory studies, ordering and review of radiographic studies, pulse oximetry and re-evaluation of patient's condition.   Observation: no ____________________________________________   INITIAL IMPRESSION / ASSESSMENT AND PLAN / ED COURSE  Pertinent labs & imaging results that were available during my care of the patient were reviewed by me and considered in my medical decision making (see chart for details).  Patient arrives hypotensive tachycardic and febrile which is concerning for sepsis versus septic shock.  Unclear if she actually had a syncopal episode or if she had a first lifetime seizure.  She reports urinary incontinence although says that this is frequent for her.  In addition to sepsis labs and broad-spectrum antibiotics and aggressive fluid resuscitation I will add on a head CT and reevaluate the patient.     The patient's lactic acid came back elevated at 2.5.  She also is hypokalemic and has acute kidney injury.  Her constellation is most consistent with severe sepsis.  At this point she requires inpatient admission for aggressive IV fluid resuscitation as well as IV antibiotics.  Her blood pressure has responded nicely to IV fluids and she does not require pressors at this time. ____________________________________________   FINAL CLINICAL IMPRESSION(S) / ED DIAGNOSES  Final diagnoses:  Severe sepsis ([place])  Acute kidney injury ([place])  Dehydration  Hypokalemia      NEW MEDICATIONS STARTED  DURING THIS VISIT:  New Prescriptions   No medications on file     Note:  This document was prepared using Dragon voice recognition software and may include unintentional dictation errors.     Darel Hong, MD 03/19/17 [phone]

## 2017-03-19 NOTE — Progress Notes (Signed)
Had vomiting with blood clot.  I have stopped heparin

## 2017-03-19 NOTE — H&P (Signed)
Jeanette Jones is an 61 y.o. female.   Chief Complaint: Fainting HPI: The patient with past medical history of lung cancer currently undergoing chemo and radiation, hypertension and hep C presents to the emergency department after an episode of syncope.  The patient's daughter found her at home presumably very briefly after falling.  The patient denies hurting herself but does not recall if she hit her head.  CT of her head did not show any intracranial abnormalities.  However, the patient was febrile upon arrival and vital signs met sepsis criteria which prompted initiation of sepsis protocol.  The patient admits to feeling normal malaise and weakness for approximately 4 days.  She admits to some nausea but denies vomiting.  Her blood pressure was low in the emergency department but mildly fluid responsive.  Once she was stabilized the emergency department staff called the hospitalist service for admission.  Past Medical History:  Diagnosis Date  . Back pain, chronic   . Carcinoma of lung ([place]) 07/2011  . Cough   . Depression   . Dyspnea   . Emphysema of lung ([place])   . H/O tubal ligation 02/12/2017   From chart  . Heart murmur   . Hep C w/o coma, chronic ([place])   . Hypertension   . Knee pain 02/12/2017  . Lung cancer ([place])   . Shoulder pain 02/09/2017  . Swallowing difficulty     Past Surgical History:  Procedure Laterality Date  . ESOPHAGOGASTRODUODENOSCOPY N/A 06/08/2015   Procedure: ESOPHAGOGASTRODUODENOSCOPY (EGD);  Surgeon: Hulen Luster, MD;  Location: [facility] ENDOSCOPY;  Service: Gastroenterology;  Laterality: N/A;  . HEMORROIDECTOMY    . LUNG BIOPSY    . TUBAL LIGATION      Family History  Problem Relation Age of Onset  . Hypertension Other   . Breast cancer Neg Hx    Social History:  reports that she has been smoking Cigarettes.  She has a 10.00 pack-year smoking history. She has never used smokeless tobacco. She reports that she drinks alcohol. She reports that she uses drugs,  including Cocaine and Marijuana.  Allergies:  Allergies  Allergen Reactions  . Carboplatin Anaphylaxis    Medications Prior to Admission  Medication Sig Dispense Refill  . cyclobenzaprine (FLEXERIL) 10 MG tablet Take 10 mg by mouth at bedtime.    . Fluticasone-Salmeterol (ADVAIR DISKUS) 500-50 MCG/DOSE AEPB Inhale 1 puff into the lungs 2 (two) times daily. 1 each 3  . ondansetron (ZOFRAN) 8 MG tablet Take 1 tablet (8 mg total) by mouth every 8 (eight) hours as needed for nausea or vomiting (start 3 days; after chemo). 40 tablet 1  . prochlorperazine (COMPAZINE) 10 MG tablet Take 1 tablet (10 mg total) by mouth every 6 (six) hours as needed for nausea or vomiting. 40 tablet 1  . lidocaine-prilocaine (EMLA) cream Apply 1 application topically as needed. To port a cath site- 1 hr prior to port access (Patient not taking: Reported on 03/08/2017) 30 g 3  . potassium chloride 20 MEQ/15ML (10%) SOLN Take 15 mLs (20 mEq total) by mouth 2 (two) times daily. (Patient not taking: Reported on 02/22/2017) 300 mL 0  . traMADol (ULTRAM) 50 MG tablet Take 1 tablet (50 mg total) by mouth every 8 (eight) hours as needed. (Patient not taking: Reported on 03/08/2017) 90 tablet 0    Results for orders placed or performed during the hospital encounter of 03/19/17 (from the past 48 hour(s))  TSH     Status: None  Collection Time: 03/18/17 12:41 AM  Result Value Ref Range   TSH 0.728 0.350 - 4.500 uIU/mL    Comment: Performed by a 3rd Generation assay with a functional sensitivity of <=0.01 uIU/mL.  Comprehensive metabolic panel     Status: Abnormal   Collection Time: 03/19/17 12:40 AM  Result Value Ref Range   Sodium 121 (L) 135 - 145 mmol/L   Potassium 2.4 (LL) 3.5 - 5.1 mmol/L    Comment: CRITICAL RESULT CALLED TO, READ BACK BY AND VERIFIED WITH AMY COHEN AT 0145 03/19/17 ALV    Chloride 90 (L) 101 - 111 mmol/L   CO2 16 (L) 22 - 32 mmol/L   Glucose, Bld 99 65 - 99 mg/dL   BUN 79 (H) 6 - 20 mg/dL    Creatinine, Ser 2.22 (H) 0.44 - 1.00 mg/dL   Calcium 8.7 (L) 8.9 - 10.3 mg/dL   Total Protein 9.5 (H) 6.5 - 8.1 g/dL   Albumin 3.4 (L) 3.5 - 5.0 g/dL   AST 80 (H) 15 - 41 U/L   ALT 38 14 - 54 U/L   Alkaline Phosphatase 67 38 - 126 U/L   Total Bilirubin 0.6 0.3 - 1.2 mg/dL   GFR calc non Af Amer 23 (L) >60 mL/min   GFR calc Af Amer 27 (L) >60 mL/min    Comment: (NOTE) The eGFR has been calculated using the CKD EPI equation. This calculation has not been validated in all clinical situations. eGFR's persistently <60 mL/min signify possible Chronic Kidney Disease.    Anion gap 15 5 - 15  Lipase, blood     Status: Abnormal   Collection Time: 03/19/17 12:40 AM  Result Value Ref Range   Lipase 193 (H) 11 - 51 U/L  Procalcitonin     Status: None   Collection Time: 03/19/17 12:40 AM  Result Value Ref Range   Procalcitonin 1.06 ng/mL    Comment:        Interpretation: PCT > 0.5 ng/mL and <= 2 ng/mL: Systemic infection (sepsis) is possible, but other conditions are known to elevate PCT as well. (NOTE)         ICU PCT Algorithm               Non ICU PCT Algorithm    ----------------------------     ------------------------------         PCT < 0.25 ng/mL                 PCT < 0.1 ng/mL     Stopping of antibiotics            Stopping of antibiotics       strongly encouraged.               strongly encouraged.    ----------------------------     ------------------------------       PCT level decrease by               PCT < 0.25 ng/mL       >= 80% from peak PCT       OR PCT 0.25 - 0.5 ng/mL          Stopping of antibiotics                                             encouraged.     Stopping of antibiotics  encouraged.    ----------------------------     ------------------------------       PCT level decrease by              PCT >= 0.25 ng/mL       < 80% from peak PCT        AND PCT >= 0.5 ng/mL             Continuing antibiotics                                               encouraged.       Continuing antibiotics            encouraged.    ----------------------------     ------------------------------     PCT level increase compared          PCT > 0.5 ng/mL         with peak PCT AND          PCT >= 0.5 ng/mL             Escalation of antibiotics                                          strongly encouraged.      Escalation of antibiotics        strongly encouraged.   Urinalysis, Complete w Microscopic     Status: Abnormal   Collection Time: 03/19/17 12:41 AM  Result Value Ref Range   Color, Urine AMBER (A) YELLOW    Comment: BIOCHEMICALS MAY BE AFFECTED BY COLOR   APPearance CLOUDY (A) CLEAR   Specific Gravity, Urine 1.013 1.005 - 1.030   pH 5.0 5.0 - 8.0   Glucose, UA NEGATIVE NEGATIVE mg/dL   Hgb urine dipstick NEGATIVE NEGATIVE   Bilirubin Urine NEGATIVE NEGATIVE   Ketones, ur NEGATIVE NEGATIVE mg/dL   Protein, ur 100 (A) NEGATIVE mg/dL   Nitrite NEGATIVE NEGATIVE   Leukocytes, UA NEGATIVE NEGATIVE   RBC / HPF 0-5 0 - 5 RBC/hpf   WBC, UA 0-5 0 - 5 WBC/hpf   Bacteria, UA MANY (A) NONE SEEN   Squamous Epithelial / LPF 0-5 (A) NONE SEEN   Mucus PRESENT    Hyaline Casts, UA PRESENT   Lactic acid, plasma     Status: Abnormal   Collection Time: 03/19/17 12:41 AM  Result Value Ref Range   Lactic Acid, Venous 2.5 (HH) 0.5 - 1.9 mmol/L    Comment: CRITICAL RESULT CALLED TO, READ BACK BY AND VERIFIED WITH AMY COHEN AT 0126 03/19/17 ALV   CBC with Differential     Status: Abnormal   Collection Time: 03/19/17 12:41 AM  Result Value Ref Range   WBC 4.6 3.6 - 11.0 K/uL   RBC 3.68 (L) 3.80 - 5.20 MIL/uL   Hemoglobin 11.5 (L) 12.0 - 16.0 g/dL   HCT 33.0 (L) 35.0 - 47.0 %   MCV 89.6 80.0 - 100.0 fL   MCH 31.3 26.0 - 34.0 pg   MCHC 34.9 32.0 - 36.0 g/dL   RDW 17.1 (H) 11.5 - 14.5 %   Platelets 326 150 - 440 K/uL   Neutrophils Relative % 56 %   Lymphocytes Relative 19 %   Monocytes Relative 24 %  Eosinophils Relative 0 %   Basophils Relative 0 %    Band Neutrophils 0 %   Metamyelocytes Relative 0 %   Myelocytes 0 %   Promyelocytes Absolute 0 %   Blasts 0 %   nRBC 0 0 /100 WBC   Other 1 %   Neutro Abs 2.6 1.4 - 6.5 K/uL   Lymphs Abs 0.9 (L) 1.0 - 3.6 K/uL   Monocytes Absolute 1.1 (H) 0.2 - 0.9 K/uL   Eosinophils Absolute 0.0 0 - 0.7 K/uL   Basophils Absolute 0.0 0 - 0.1 K/uL   RBC Morphology MARKED POIKILOCYTOSIS     Comment: MIXED RBC POPULATION POLYCHROMASIA PRESENT   Troponin I     Status: None   Collection Time: 03/19/17 12:41 AM  Result Value Ref Range   Troponin I <0.03 <0.03 ng/mL  Blood culture (routine x 2)     Status: None (Preliminary result)   Collection Time: 03/19/17 12:41 AM  Result Value Ref Range   Specimen Description BLOOD RIGHT ANTECUBITAL    Special Requests      BOTTLES DRAWN AEROBIC AND ANAEROBIC Blood Culture adequate volume   Culture NO GROWTH < 12 HOURS    Report Status PENDING   Blood culture (routine x 2)     Status: None (Preliminary result)   Collection Time: 03/19/17 12:42 AM  Result Value Ref Range   Specimen Description BLOOD LEFT ANTECUBITAL    Special Requests      BOTTLES DRAWN AEROBIC AND ANAEROBIC Blood Culture adequate volume   Culture NO GROWTH < 12 HOURS    Report Status PENDING   Influenza panel by PCR (type A & B)     Status: None   Collection Time: 03/19/17  1:21 AM  Result Value Ref Range   Influenza A By PCR NEGATIVE NEGATIVE   Influenza B By PCR NEGATIVE NEGATIVE    Comment: (NOTE) The Xpert Xpress Flu assay is intended as an aid in the diagnosis of  influenza and should not be used as a sole basis for treatment.  This  assay is FDA approved for nasopharyngeal swab specimens only. Nasal  washings and aspirates are unacceptable for Xpert Xpress Flu testing.   Protime-INR     Status: None   Collection Time: 03/19/17  1:21 AM  Result Value Ref Range   Prothrombin Time 14.7 11.4 - 15.2 seconds   INR 1.16   Lactic acid, plasma     Status: None   Collection Time:  03/19/17  3:49 AM  Result Value Ref Range   Lactic Acid, Venous 0.7 0.5 - 1.9 mmol/L   Ct Head Wo Contrast  Result Date: 03/19/2017 CLINICAL DATA:  New onset seizure.  Syncope.  Active lung cancer. EXAM: CT HEAD WITHOUT CONTRAST TECHNIQUE: Contiguous axial images were obtained from the base of the skull through the vertex without intravenous contrast. COMPARISON:  No recent exams.  Remote head CT 02/07/2008 reviewed. FINDINGS: Brain: No intracranial hemorrhage, mass effect, or midline shift. No hydrocephalus. The basilar cisterns are patent. No evidence of territorial infarct or acute ischemia. No extra-axial or intracranial fluid collection. Vascular: Atherosclerosis of skullbase vasculature without hyperdense vessel or abnormal calcification. Skull: No fracture or suspicious lesion. Sclerotic focus about the inner table of the left temporal bone was seen on prior PET-CT 04/15/2015 and of doubtful clinical significance. Sinuses/Orbits: Paranasal sinuses and mastoid air cells are clear. The visualized orbits are unremarkable. Other: None. IMPRESSION: No evidence of acute abnormality or intracranial metastatic disease on  CT. Consider brain MRI with contrast to assess for occult intracranial metastatic disease based on clinical concern. Electronically Signed   By: Jeb Levering M.D.   On: 03/19/2017 02:43   Dg Chest Port 1 View  Result Date: 03/19/2017 CLINICAL DATA:  61 year old female with sepsis. History of right lower lobe lung cancer with chemotherapy. EXAM: PORTABLE CHEST 1 VIEW COMPARISON:  PET-CT dated 02/12/2017 FINDINGS: Left pectoral Port-A-Cath with tip over the central SVC close the cavoatrial junction. Focal area of opacity in the right upper lobe/ right suprahilar region noted corresponding to the mass seen on the prior PET-CT. Faint area of density projecting over the medial right lower lung field corresponds to the mass like density seen in the right lower lobe. The left lung is clear.  There is no pleural effusion or pneumothorax. The cardiac silhouette is within normal limits. No acute osseous pathology. IMPRESSION: Right upper and lower lobe opacities corresponding to the masses seen on the prior PET-CT. No new consolidative change. Electronically Signed   By: Anner Crete M.D.   On: 03/19/2017 01:02    Review of Systems  Constitutional: Positive for malaise/fatigue. Negative for chills and fever.  HENT: Negative for sore throat and tinnitus.   Eyes: Negative for blurred vision and redness.  Respiratory: Negative for cough and shortness of breath.   Cardiovascular: Negative for chest pain, palpitations, orthopnea and PND.  Gastrointestinal: Negative for abdominal pain, diarrhea, nausea and vomiting.  Genitourinary: Negative for dysuria, frequency and urgency.  Musculoskeletal: Negative for joint pain and myalgias.  Skin: Negative for rash.       No lesions  Neurological: Positive for weakness. Negative for speech change and focal weakness.  Endo/Heme/Allergies: Does not bruise/bleed easily.       No temperature intolerance  Psychiatric/Behavioral: Negative for depression and suicidal ideas.    Blood pressure (!) 89/62, pulse 77, temperature (!) 97.5 F (36.4 C), temperature source Oral, resp. rate (!) 21, weight 50.3 kg (111 lb), SpO2 100 %. Physical Exam  Vitals reviewed. Constitutional: She is oriented to person, place, and time. She appears well-developed and well-nourished.  HENT:  Head: Normocephalic and atraumatic.  Mouth/Throat: Oropharynx is clear and moist.  Eyes: Pupils are equal, round, and reactive to light. Conjunctivae and EOM are normal. No scleral icterus.  Neck: Normal range of motion. Neck supple. No JVD present. No tracheal deviation present. No thyromegaly present.  Cardiovascular: Normal rate, regular rhythm and normal heart sounds.  Exam reveals no gallop and no friction rub.   No murmur heard. Respiratory: Effort normal and breath sounds  normal.  GI: Soft. Bowel sounds are normal. She exhibits no distension. There is no tenderness.  Genitourinary:  Genitourinary Comments: Deferred  Musculoskeletal: Normal range of motion. She exhibits no edema.  Lymphadenopathy:    She has no cervical adenopathy.  Neurological: She is alert and oriented to person, place, and time. No cranial nerve deficit. She exhibits normal muscle tone.  Skin: Skin is warm and dry. No rash noted. No erythema.  Psychiatric: She has a normal mood and affect. Her behavior is normal. Judgment and thought content normal.     Assessment/Plan This is a 62 year old female admitted for sepsis. 1.  Sepsis: The patient meets criteria via tachycardia and fever.  She is hemodynamically stable albeit with low blood pressure at the moment.  She is dehydrated and has a elevated lactic acid.  She is mentating well and heart rate and respiratory rate are decreasing.  We will continue  to fluid resuscitate and provide broad-spectrum antibiotic coverage.  Follow blood cultures for growth and sensitivities. 2.  Acute kidney injury: Secondary to dehydration.  Plan as above.  Avoid nephrotoxic agents. 3.  Hypertension: The patient carries this diagnosis but she is relatively hypotensive at this time.  Continue to monitor blood pressure. 4.  Lung cancer: The patient recently started chemo and radiation.  Supportive care. 5.  DVT prophylaxis: Heparin 6.  GI prophylaxis: As needed The patient is a full code.  Time spent on admission orders and patient care possibly 45 minutes  Harrie Foreman, MD 03/19/2017, 7:58 AM

## 2017-03-19 NOTE — ED Triage Notes (Signed)
Patient is a lung cancer patient, just recently started chemo and radiation again.  Patient was on the way to restroom and passed out, had urinary incontinence.  Patient was unconscious for approx 5 mins per family.  Unwitnessed fall.

## 2017-03-19 NOTE — ED Notes (Signed)
Attempted to call report. Primary/charge RN unavailable at this time. Was informed that RN would call back as soon as available.

## 2017-03-19 NOTE — Progress Notes (Addendum)
Pharmacy Antibiotic Note  Jeanette Jones is a 61 y.o. female admitted on 03/19/2017 with sepsis.  Pharmacy has been consulted for vancomycin and Zosyn dosing.  Plan: Zosyn 3.375g IV q8h (4 hour infusion).   DW 50kg  Vd 35L kei 0.023 hr-1  T1/2 30 hours Vancomycin 750 mg q 36 hours ordered with stacked dosing. Level before 4th dose. Goal trough 15-20  Weight: 111 lb (50.3 kg)  Temp (24hrs), Avg:100.6 F (38.1 C), Min:100.6 F (38.1 C), Max:100.6 F (38.1 C)   Recent Labs Lab 03/19/17 0040 03/19/17 0041  CREATININE 2.22*  --   LATICACIDVEN  --  2.5*    Estimated Creatinine Clearance: 21.7 mL/min (A) (by C-G formula based on SCr of 2.22 mg/dL (H)).    Allergies  Allergen Reactions  . Carboplatin Anaphylaxis    Antimicrobials this admission: Vancomycin, Zosyn 11/2  >>    >>   Dose adjustments this admission:   Microbiology results: 11/2 BCx: pending 11/2 UCx: pending       11/2 UA: pending 11/2 CXR: R upper and lower opacities Thank you for allowing pharmacy to be a part of this patient's care.  Yazir Koerber S 03/19/2017 1:55 AM

## 2017-03-19 NOTE — ED Triage Notes (Signed)
Patient c/o weakness, anorexia, fever (TMax 101) and syncope

## 2017-03-19 NOTE — Progress Notes (Signed)
Initial Nutrition Assessment  DOCUMENTATION CODES:   Severe malnutrition in context of chronic illness  INTERVENTION:   Recommend PEG tube placement if pt unable to meet estimated needs via oral intake   Monitor K, Mg and P as pt is at high refeeding risk  Dysphagia 3 diet with minced meats and gravy  Ensure Enlive po QID, each supplement provides 350 kcal and 20 grams of protein  Vital Cuisine TID, each supplement provides 520kcal and 22g of protein.   Magic cup TID with meals, each supplement provides 290 kcal and 9 grams of protein  MVI liquid daily   NUTRITION DIAGNOSIS:   Severe Malnutrition related to cancer and cancer related treatments as evidenced by severe fat depletion, severe muscle depletion, 10 percent weight loss in 6 months and 5% weight loss in 2 weeks.  GOAL:   Patient will meet greater than or equal to 90% of their needs  MONITOR:   PO intake, Supplement acceptance, Labs, Weight trends  REASON FOR ASSESSMENT:   Consult Assessment of nutrition requirement/status  ASSESSMENT:   61 y/o female with past medical history of lung cancer currently undergoing chemo and radiation, hypertension and hep C presents to the emergency department after an episode of syncope.    Met with pt in room today. Pt reports poor appetite and oral intake for several months pta but reports that she has not eaten anything for a week. Per chart, pt has lost 11lbs(10%) in 6 months and 5lbs(5%) in the past two weeks; this is severe weight loss. Pt with dysphagia and having difficulty with swallowing foods while RD present in room. Pt reports food getting stuck in her throat that will eventually move down after a few minutes. Pt seen by SLP today; recommended for dysphagia 3 diet with minced meats and thin liquids. Pt advised to drink Ensure between bites and to only take one bite at a time. Pt did drink an Ensure while RD was in the room today. Pt ate only about 25% of her lunch today.  Pt may not be able to meet her estimated needs via oral intake r/t dysphagia and her increased needs from her metabolic illness. Pt will likely need to drink at least three Ensure per day and eat 50% of her meals to meet her estimated needs. Pt may be able to meet this in hospital but the concern would be if pt is able to meet this at home. Pt reports difficulty affording and getting access to Ensure at home. RD spoke to RN, recommended having MD write a prescription for Ensure; insurance may cover supplements if written prescription provided. If pt is unable to meet her estimated needs via oral intake, recommend PEG tube placement. Pt is at high refeeding risk; recommend monitor K, P, and Mg for 3 days and supplement as needed per MD discretion. RD will add supplements and MVI to help pt meet her needs.      Medications reviewed and include: heparin, NaCl w/ KCl @125ml /hr, zosyn  Labs reviewed: Na 121(L), K 2.4(L), Cl 90(L), BUN 79(H), creat 2.22(H), Ca 8.7(L) adj. 9.18 wnl, alb 3.4(L), lipase 193(H), AST 80(H)  Nutrition-Focused physical exam completed. Findings are severe fat and muscle depletions over entire body, and no edema.   Diet Order:  DIET DYS 3 Room service appropriate? Yes with Assist; Fluid consistency: Thin  EDUCATION NEEDS:   Education needs have been addressed  Skin:  Reviewed RN Assessment  Last BM:  11/1  Height:   Ht  Readings from Last 1 Encounters:  03/19/17 5' 4"  (1.626 m)    Weight:   Wt Readings from Last 1 Encounters:  03/19/17 106 lb 12.8 oz (48.4 kg)    Ideal Body Weight:  54.5 kg  BMI:  Body mass index is 18.33 kg/m.  Estimated Nutritional Needs:   Kcal:  1500-1700kcal/day   Protein:  77-86g/day   Fluid:  >1.5L/day   Koleen Distance MS, RD, LDN Pager #[phone] After Hours Pager: [phone]

## 2017-03-19 NOTE — Progress Notes (Signed)
[place] [facility] [place] NAME: Jeanette Jones    MR#:  [id number]  DATE OF BIRTH:  [birth year]  SUBJECTIVE:  CHIEF COMPLAINT:   Chief Complaint  Patient presents with  . Loss of Consciousness    Metastatic cancer, have difficulty swallowing food and meds and having nausea. Came with severe dehydration and suspected septic.  REVIEW OF SYSTEMS:  CONSTITUTIONAL: No fever,positive for fatigue or weakness.  EYES: No blurred or double vision.  EARS, NOSE, AND THROAT: No tinnitus or ear pain.  RESPIRATORY: No cough, shortness of breath, wheezing or hemoptysis.  CARDIOVASCULAR: No chest pain, orthopnea, edema.  GASTROINTESTINAL: No nausea, vomiting, diarrhea or abdominal pain.  GENITOURINARY: No dysuria, hematuria.  ENDOCRINE: No polyuria, nocturia,  HEMATOLOGY: No anemia, easy bruising or bleeding SKIN: No rash or lesion. MUSCULOSKELETAL: No joint pain or arthritis.   NEUROLOGIC: No tingling, numbness, weakness.  PSYCHIATRY: No anxiety or depression.   ROS  DRUG ALLERGIES:   Allergies  Allergen Reactions  . Carboplatin Anaphylaxis    VITALS:  Blood pressure 96/70, pulse 81, temperature 98.2 F (36.8 C), temperature source Oral, resp. rate 15, height 5\' 4"  (1.626 m), weight 48.4 kg (106 lb 12.8 oz), SpO2 100 %.  PHYSICAL EXAMINATION:  GENERAL:  61 y.o.-year-old thin patient lying in the bed with no acute distress.  EYES: Pupils equal, round, reactive to light and accommodation. No scleral icterus. Extraocular muscles intact.  HEENT: Head atraumatic, normocephalic. Oropharynx and nasopharynx clear.  NECK:  Supple, no jugular venous distention. No thyroid enlargement, no tenderness.  LUNGS: Normal breath sounds bilaterally, no wheezing, rales,rhonchi or crepitation. No use of accessory muscles of respiration.  CARDIOVASCULAR: S1, S2 normal. No murmurs, rubs, or gallops.  ABDOMEN: Soft, nontender, nondistended. Bowel sounds present. No  organomegaly or mass.  EXTREMITIES: No pedal edema, cyanosis, or clubbing.  NEUROLOGIC: Cranial nerves II through XII are intact. Muscle strength 5/5 in all extremities. Sensation intact. Gait not checked.  PSYCHIATRIC: The patient is alert and oriented x 3.  SKIN: No obvious rash, lesion, or ulcer.   Physical Exam LABORATORY PANEL:   CBC  Recent Labs Lab 03/19/17 0041  WBC 4.6  HGB 11.5*  HCT 33.0*  PLT 326   ------------------------------------------------------------------------------------------------------------------  Chemistries   Recent Labs Lab 03/19/17 0040  NA 121*  K 2.4*  CL 90*  CO2 16*  GLUCOSE 99  BUN 79*  CREATININE 2.22*  CALCIUM 8.7*  MG 2.3  AST 80*  ALT 38  ALKPHOS 67  BILITOT 0.6   ------------------------------------------------------------------------------------------------------------------  Cardiac Enzymes  Recent Labs Lab 03/19/17 0041  TROPONINI <0.03   ------------------------------------------------------------------------------------------------------------------  RADIOLOGY:  Ct Head Wo Contrast  Result Date: 03/19/2017 CLINICAL DATA:  New onset seizure.  Syncope.  Active lung cancer. EXAM: CT HEAD WITHOUT CONTRAST TECHNIQUE: Contiguous axial images were obtained from the base of the skull through the vertex without intravenous contrast. COMPARISON:  No recent exams.  Remote head CT 02/07/2008 reviewed. FINDINGS: Brain: No intracranial hemorrhage, mass effect, or midline shift. No hydrocephalus. The basilar cisterns are patent. No evidence of territorial infarct or acute ischemia. No extra-axial or intracranial fluid collection. Vascular: Atherosclerosis of skullbase vasculature without hyperdense vessel or abnormal calcification. Skull: No fracture or suspicious lesion. Sclerotic focus about the inner table of the left temporal bone was seen on prior PET-CT 04/15/2015 and of doubtful clinical significance. Sinuses/Orbits:  Paranasal sinuses and mastoid air cells are clear. The visualized orbits are unremarkable. Other: None. IMPRESSION: No evidence  of acute abnormality or intracranial metastatic disease on CT. Consider brain MRI with contrast to assess for occult intracranial metastatic disease based on clinical concern. Electronically Signed   By: Jeb Levering M.D.   On: 03/19/2017 02:43   Dg Chest Port 1 View  Result Date: 03/19/2017 CLINICAL DATA:  61 year old female with sepsis. History of right lower lobe lung cancer with chemotherapy. EXAM: PORTABLE CHEST 1 VIEW COMPARISON:  PET-CT dated 02/12/2017 FINDINGS: Left pectoral Port-A-Cath with tip over the central SVC close the cavoatrial junction. Focal area of opacity in the right upper lobe/ right suprahilar region noted corresponding to the mass seen on the prior PET-CT. Faint area of density projecting over the medial right lower lung field corresponds to the mass like density seen in the right lower lobe. The left lung is clear. There is no pleural effusion or pneumothorax. The cardiac silhouette is within normal limits. No acute osseous pathology. IMPRESSION: Right upper and lower lobe opacities corresponding to the masses seen on the prior PET-CT. No new consolidative change. Electronically Signed   By: Anner Crete M.D.   On: 03/19/2017 01:02    ASSESSMENT AND PLAN:   Active Problems:   Sepsis ([place])   * sepsis    Suspected on admission, not sure if it really is sepsis.   Pt is very dehydrated. And hypotensive due to that.   She have dysphagia and nausea and vomit.    She may have some aspiration pneumonia.   Will give zosyn. But may taper quickly.   IV fluids. Follow cultures.  * Dehydration   Ac renal failure   IV fluids.  * hypotension   IV fluid bolus given.  * Lung cancer    Called Oncology consult.   She have RU lobe mass.     * Dysphagia   Likely may have some compression due to the mass in R U lobe.   SLP eval.  * acute  pancreatitis   High lipase   Nausea   IV fluids   * Hypokalemia   IV fluids for replacement.    Mg checked, normal.   All the records are reviewed and case discussed with Care Management/Social Workerr. Management plans discussed with the patient, family and they are in agreement.  CODE STATUS: full.  TOTAL TIME TAKING CARE OF THIS PATIENT: 50 critical care minutes.    POSSIBLE D/C IN 2-3 DAYS, DEPENDING ON CLINICAL CONDITION.   Vaughan Basta M.D on 03/19/2017   Between 7am to 6pm - Pager - [phone]  After 6pm go to [email or web address] - password [email or web address] [facility]  Office  [phone]  CC: Primary care physician; Center, [place]  Note: This dictation was prepared with Diplomatic Services operational officer dictation along with smaller phrase technology. Any transcriptional errors that result from this process are unintentional.

## 2017-03-19 NOTE — Evaluation (Addendum)
Clinical/Bedside Swallow Evaluation Patient Details  Name: Jeanette Jones MRN: [id number] Date of Birth: [birth year]  Today's Date: 03/19/2017 Time: SLP Start Time (ACUTE ONLY): 1315 SLP Stop Time (ACUTE ONLY): 1415 SLP Time Calculation (min) (ACUTE ONLY): 60 min  Past Medical History:  Past Medical History:  Diagnosis Date  . Back pain, chronic   . Carcinoma of lung ([place]) 07/2011  . Cough   . Depression   . Dyspnea   . Emphysema of lung ([place])   . H/O tubal ligation 02/12/2017   From chart  . Heart murmur   . Hep C w/o coma, chronic ([place])   . Hypertension   . Knee pain 02/12/2017  . Lung cancer ([place])   . Shoulder pain 02/09/2017  . Swallowing difficulty    Past Surgical History:  Past Surgical History:  Procedure Laterality Date  . ESOPHAGOGASTRODUODENOSCOPY N/A 06/08/2015   Procedure: ESOPHAGOGASTRODUODENOSCOPY (EGD);  Surgeon: Hulen Luster, MD;  [facility] END[facility];  Service: Gastroenterology;  Laterality: N/A;  . HEMORROIDECTOMY    . LUNG BIOPSY    . TUBAL LIGATION     HPI:  Pt is a 61 y/o female w/ PMH of several issues including dx of lung cancer currently undergoing chemo and radiation, hypertension and hep C presents to the emergency department after an episode of syncope.  The patient's daughter found her at home presumably very briefly after falling.  The patient denies hurting herself but does not recall if she hit her head.  CT of her head did not show any intracranial abnormalities.  However, the patient was febrile upon arrival and vital signs met sepsis criteria which prompted initiation of sepsis protocol.  The patient admits to feeling normal malaise and weakness for approximately 4 days.  She admits to some nausea but denies vomiting.  Her blood pressure was low in the emergency department but mildly fluid responsive.  Once she was stabilized the emergency department staff called the hospitalist service for admission. Pt does have vocal quality changes; more  evident in past ~3 week per pt but she could not give any further detail. Pt stated she was drinking liquids "ok" (NSG agreed) but that she had "problems w/ meat". When asked about reducing eating meats if problematic, she responded stating "but I only like to eat meat w/ a lot of salt".    Assessment / Plan / Recommendation Clinical Impression  Pt appears to present w/ adequate pharyngeal phase swallow function w/ liquids and food trials(broken down small, moistened) when following general aspiration precautions. However, by putting too many boluses in her mouth at a time w/out clearing b/t increases mastication time/effort but also increases the risk for difficulty swallowing the food pharyngeally and increases risk for choking. When pt alternated SMALL, SINGLE bites of food w/ SMALL, sips of liquid, she appeared to swallow and clear appropriately. No oral phase deficits were noted w/ thin liquids and purees. Pt consumed trials of iced tea and Ensure easily and w/out s/s of aspiration. Due to pt's presenting vocal quality and "fullness" feeling in the upper Esophagus, recommend ENT f/u for direct viewing of the vocal cords and GI f/u for Esophageal dysmotility/assesment and tx - suspect could be related to pt's Oncological issues. Of note, any nausea and vomiting increases a pt's risk for aspiration of the vomitus thus increasing risk for negative sequelae of aspiration including Pulmonary decline. Recommend modifying diet to soften the foods and minced meats; educated pt on the need to reduce or limit problematic  foods but she stated "that's what I like to eat(meat)". Recommend ongoing monitoring during meals for follow through w/ aspiration precautions including small, single bites and fully clearing mouth b/t bites. NSG and MD updated.  SLP Visit Diagnosis: Dysphagia, oropharyngeal phase (R13.12)    Aspiration Risk   (reduced when following precautions)    Diet Recommendation  Dysphagia level 3 w/  MINCED meats w/ gravy added to moisten well; Thin liquids. Aspiration precautions. Reflux precautions  Medication Administration: Whole meds with puree     Recommended Consults: Consider ENT evaluation;Consider GI evaluation;Consider esophageal assessment Oral Care Recommendations: Oral care BID;Patient independent with oral care;Staff/trained caregiver to provide oral care Other Recommendations:  (n/a)   Follow up Recommendations  (TBD)      Frequency and Duration min 2x/week  1 week       Prognosis Prognosis for Safe Diet Advancement: Fair Barriers to Reach Goals: Cognitive deficits;Time post onset;Severity of deficits (easily distracted)      Swallow Study   General Date of Onset: 03/19/17 HPI: Pt is a 62 y/o female w/ PMH of several issues including dx of lung cancer currently undergoing chemo and radiation, hypertension and hep C presents to the emergency department after an episode of syncope.  The patient's daughter found her at home presumably very briefly after falling.  The patient denies hurting herself but does not recall if she hit her head.  CT of her head did not show any intracranial abnormalities.  However, the patient was febrile upon arrival and vital signs met sepsis criteria which prompted initiation of sepsis protocol.  The patient admits to feeling normal malaise and weakness for approximately 4 days.  She admits to some nausea but denies vomiting.  Her blood pressure was low in the emergency department but mildly fluid responsive.  Once she was stabilized the emergency department staff called the hospitalist service for admission. Pt does have vocal quality changes; more evident in past ~3 week per pt but she could not give any further detail. Pt stated she was drinking liquids "ok" (NSG agreed) but that she had "problems w/ meat". When asked about reducing eating meats if problematic, she responded stating "but I only like to eat meat w/ a lot of salt".  Type of  Study: Bedside Swallow Evaluation Previous Swallow Assessment: none reported by pt but stated she has had "some difficulty" Diet Prior to this Study: Regular;Thin liquids (at home) Temperature Spikes Noted: No (wbc 4.6) Respiratory Status: Room air History of Recent Intubation: No Behavior/Cognition: Alert;Cooperative;Pleasant mood;Distractible;Requires cueing Oral Cavity Assessment: Within Functional Limits Oral Care Completed by SLP: Recent completion by staff Oral Cavity - Dentition: Missing dentition;Poor condition (molars) Vision: Functional for self-feeding Self-Feeding Abilities: Able to feed self;Needs assist;Needs set up (noted some impulsivity) Patient Positioning: Upright in bed Baseline Vocal Quality: Hoarse (gravely, rough - recommend direct view by ENT) Volitional Cough: Strong Volitional Swallow: Able to elicit    Oral/Motor/Sensory Function Overall Oral Motor/Sensory Function: Within functional limits   Ice Chips Ice chips: Not tested   Thin Liquid Thin Liquid: Within functional limits Presentation: Cup;Straw;Self Fed (~10 ozs total )    Nectar Thick Nectar Thick Liquid: Not tested   Honey Thick Honey Thick Liquid: Not tested   Puree Puree: Within functional limits Presentation: Self Fed;Spoon (~3-4 ozs)   Solid   GO   Solid: Impaired Presentation: Self Fed;Spoon (multiple boluses of 8+) Oral Phase Impairments: Impaired mastication (impulsivity during self-feeding) Oral Phase Functional Implications: Impaired mastication;Prolonged oral transit Pharyngeal  Phase Impairments: Throat Clearing - Delayed (suspect related to increased secretions/saliva) Other Comments: lengthy mastication; mouth appeared overly full w/ consecutive bites taken b/f clearing b/t trials    Functional Assessment Tool Used: clinical judgement Functional Limitations: Swallowing Swallow Current Status (P1068): At least 1 percent but less than 20 percent impaired, limited or  restricted Swallow Goal Status [phone]): At least 1 percent but less than 20 percent impaired, limited or restricted Swallow Discharge Status [phone]): At least 1 percent but less than 20 percent impaired, limited or restricted      Orinda Kenner, MS, CCC-SLP Watson,Katherine 03/19/2017,2:24 PM

## 2017-03-20 ENCOUNTER — Inpatient Hospital Stay: Payer: Medicare Other

## 2017-03-20 DIAGNOSIS — R1310 Dysphagia, unspecified: Secondary | ICD-10-CM

## 2017-03-20 DIAGNOSIS — R109 Unspecified abdominal pain: Secondary | ICD-10-CM

## 2017-03-20 DIAGNOSIS — N390 Urinary tract infection, site not specified: Secondary | ICD-10-CM

## 2017-03-20 DIAGNOSIS — R748 Abnormal levels of other serum enzymes: Secondary | ICD-10-CM

## 2017-03-20 LAB — CBC
HEMATOCRIT: 25.5 % — AB (ref 35.0–47.0)
Hemoglobin: 8.6 g/dL — ABNORMAL LOW (ref 12.0–16.0)
MCH: 30.1 pg (ref 26.0–34.0)
MCHC: 33.5 g/dL (ref 32.0–36.0)
MCV: 89.7 fL (ref 80.0–100.0)
PLATELETS: 267 10*3/uL (ref 150–440)
RBC: 2.85 MIL/uL — ABNORMAL LOW (ref 3.80–5.20)
RDW: 16.7 % — AB (ref 11.5–14.5)
WBC: 4 10*3/uL (ref 3.6–11.0)

## 2017-03-20 LAB — GASTROINTESTINAL PANEL BY PCR, STOOL (REPLACES STOOL CULTURE)

## 2017-03-20 LAB — COMPREHENSIVE METABOLIC PANEL
ALBUMIN: 2.5 g/dL — AB (ref 3.5–5.0)
ALT: 32 U/L (ref 14–54)
AST: 71 U/L — AB (ref 15–41)
Alkaline Phosphatase: 67 U/L (ref 38–126)
Anion gap: 6 (ref 5–15)
BILIRUBIN TOTAL: 0.5 mg/dL (ref 0.3–1.2)
BUN: 30 mg/dL — AB (ref 6–20)
CHLORIDE: 109 mmol/L (ref 101–111)
CO2: 17 mmol/L — ABNORMAL LOW (ref 22–32)
CREATININE: 0.83 mg/dL (ref 0.44–1.00)
Calcium: 8.1 mg/dL — ABNORMAL LOW (ref 8.9–10.3)
GFR calc Af Amer: >60 mL/min (ref >60)
GLUCOSE: 118 mg/dL — AB (ref 65–99)
Potassium: 3.4 mmol/L — ABNORMAL LOW (ref 3.5–5.1)
Sodium: 132 mmol/L — ABNORMAL LOW (ref 135–145)
Total Protein: 6.9 g/dL (ref 6.5–8.1)

## 2017-03-20 LAB — LIPASE, BLOOD: Lipase: 231 U/L — ABNORMAL HIGH (ref 11–51)

## 2017-03-20 LAB — C DIFFICILE QUICK SCREEN W PCR REFLEX
C DIFFICILE (CDIFF) TOXIN: NEGATIVE
C Diff antigen: NEGATIVE
C Diff interpretation: NOT DETECTED

## 2017-03-20 LAB — PHOSPHORUS: PHOSPHORUS: 1.3 mg/dL — AB (ref 2.5–4.6)

## 2017-03-20 LAB — MAGNESIUM: MAGNESIUM: 2 mg/dL (ref 1.7–2.4)

## 2017-03-20 LAB — TSH: TSH: 0.32 u[IU]/mL — AB (ref 0.350–4.500)

## 2017-03-20 MED ORDER — ORAL CARE MOUTH RINSE
15.0000 mL | Freq: Two times a day (BID) | OROMUCOSAL | Status: DC
  Administered 2017-03-20 – 2017-03-21 (×4): 15 mL via OROMUCOSAL

## 2017-03-20 MED ORDER — MAGNESIUM SULFATE 4 GM/100ML IV SOLN
4.0000 g | Freq: Once | INTRAVENOUS | Status: AC
  Administered 2017-03-20: 4 g via INTRAVENOUS
  Filled 2017-03-20: qty 100

## 2017-03-20 MED ORDER — IOPAMIDOL (ISOVUE-300) INJECTION 61%
75.0000 mL | Freq: Once | INTRAVENOUS | Status: AC | PRN
  Administered 2017-03-20: 75 mL via INTRAVENOUS

## 2017-03-20 MED ORDER — LOPERAMIDE HCL 1 MG/5ML PO LIQD
2.0000 mg | Freq: Four times a day (QID) | ORAL | Status: DC | PRN
  Administered 2017-03-20: 2 mg via ORAL
  Filled 2017-03-20 (×2): qty 10

## 2017-03-20 MED ORDER — CHLORHEXIDINE GLUCONATE 0.12 % MT SOLN
15.0000 mL | Freq: Two times a day (BID) | OROMUCOSAL | Status: DC
  Administered 2017-03-20 – 2017-03-24 (×7): 15 mL via OROMUCOSAL
  Filled 2017-03-20 (×8): qty 15

## 2017-03-20 NOTE — Evaluation (Signed)
Physical Therapy Evaluation Patient Details Name: Jeanette Jones MRN: [id number] DOB: Jan 27, 1956 Today's Date: 03/20/2017   History of Present Illness  Annina Piotrowski is a 61yo black female who comes [facility] 11/[facility] after syncopal episode at home. PMH: Lung CA (currents receiving chemo/radiation), HTN, Hep C, emphesema, Knee pain, Shoulder pain, falls at home. At baseline patient performs mostly household AMB without A/E, lives with 2 daughters adn gradnchildren. Daughters assist with groceries, househwork, and meals. The patient reports that self care is difficult to perform, msotly bathing, but that no one helps her. She does nto drive, and takes the bus to the cancer center for treatments.   Clinical Impression  Pt admitted with above diagnosis. Pt currently with functional limitations due to the deficits listed below (see "PT Problem List"). Upon entry, the patient is received semirecumbent in bed, no family/caregiver present. Pt reports she is sleepy, she appears lethargic, has her leg over the edge for the bed resting on her meal tray. She is unable to say why she came is and is surprised to hear that she sustained a syncopal episode. Functional mobility assessment demonstrates mild-moderate strength impairment in trunk and BLE, the pt now requiring heavy effort to perform bed mobility and transfers, whereas the patient performed these at a higher level of independence PTA. AMB is held at thsi time due to orthostasis and dizziness. Pt reports history of dizziness at baseline without timeline detail. Pt reports 2 falls. Pt will benefit from skilled PT intervention to increase independence and safety with basic mobility in preparation for discharge to the venue listed below.      03/20/17 0930  Therapy Vitals  Patient Position (if appropriate) Orthostatic Vitals  Orthostatic Sitting  BP- Sitting 90/58  Pulse- Sitting 87  Orthostatic Standing at 0 minutes  BP- Standing at 0 minutes (!)  72/54 (increased dizziness/instability)  Pulse- Standing at 0 minutes 112        Follow Up Recommendations Home health PT    Equipment Recommendations  None recommended by PT    Recommendations for Other Services       Precautions / Restrictions Precautions Precautions: Fall Restrictions Weight Bearing Restrictions: No      Mobility  Bed Mobility Overal bed mobility: Modified Independent                Transfers Overall transfer level: Needs assistance Equipment used: None Transfers: Sit to/from Stand Sit to Stand: Supervision;Min guard         General transfer comment: pt gets dizzy after transfer to [facility], is orthostatic  Ambulation/Gait Ambulation/Gait assistance:  (held due to symptomatic orthostasis)              Stairs            Wheelchair Mobility    Modified Rankin (Stroke Patients Only)       Balance Overall balance assessment: History of Falls (unable to formerally assess, but unsteady standing and transfering. )                                           Pertinent Vitals/Pain Pain Assessment: No/denies pain    Home Living Family/patient expects to be discharged to:: Private residence Living Arrangements: Children Available Help at Discharge: Family Type of Home: House Home Access: Stairs to enter Entrance Stairs-Rails: None (rail is in Marine scientist) Technical brewer of Steps: 3 Home  Layout: Two level;Able to live on main level with bedroom/bathroom Home Equipment: None      Prior Function Level of Independence: Needs assistance   Gait / Transfers Assistance Needed: household distances only, with frequent dizziness   ADL's / Homemaking Assistance Needed: has difficulty bathing self         Hand Dominance        Extremity/Trunk Assessment                Communication      Cognition Arousal/Alertness: Lethargic   Overall Cognitive Status: Difficult to assess                                  General Comments: lots of slurred speech, somewhat incoherent at times, seems quite drowsy,.       General Comments      Exercises     Assessment/Plan    PT Assessment Patient needs continued PT services  PT Problem List Decreased strength;Decreased activity tolerance;Decreased balance;Decreased safety awareness;Decreased mobility       PT Treatment Interventions Gait training;Functional mobility training;Therapeutic activities;Therapeutic exercise;Balance training;Stair training;Patient/family education    PT Goals (Current goals can be found in the Care Plan section)  Acute Rehab PT Goals Patient Stated Goal: regain strength, DC diarrhea  PT Goal Formulation: With patient Time For Goal Achievement: 04/03/17 Potential to Achieve Goals: Good    Frequency Min 2X/week   Barriers to discharge Decreased caregiver support sounds as though relationship with daughters is complicated, pt expressessing some discontent with the lack of assistance they provide with some tasks.     Co-evaluation               AM-PAC PT "6 Clicks" Daily Activity  Outcome Measure Difficulty turning over in bed (including adjusting bedclothes, sheets and blankets)?: A Little Difficulty moving from lying on back to sitting on the side of the bed? : A Lot Difficulty sitting down on and standing up from a chair with arms (e.g., wheelchair, bedside commode, etc,.)?: A Little Help needed moving to and from a bed to chair (including a wheelchair)?: A Little Help needed walking in hospital room?: Total Help needed climbing 3-5 steps with a railing? : Total 6 Click Score: 13    End of Session   Activity Tolerance: Patient limited by lethargy;Treatment limited secondary to medical complications (Comment) Patient left: in chair;with call bell/phone within reach;with chair alarm set Nurse Communication:  (orthostasis, watery bowel movement ) PT Visit Diagnosis: Unsteadiness on  feet (R26.81);Other abnormalities of gait and mobility (R26.89);History of falling (Z91.81);Dizziness and giddiness (R42)    Time: 9485-4627 PT Time Calculation (min) (ACUTE ONLY): 25 min   Charges:   PT Evaluation $PT Eval Moderate Complexity: 1 Mod PT Treatments $Therapeutic Activity: 8-22 mins   PT G Codes:       11:27 AM, 03-25-2017 Etta Grandchild, PT, DPT Physical Therapist - [place] [phone] ([place])  [phone] (mobile)    Buccola,Allan C 03-25-2017, 11:24 AM

## 2017-03-20 NOTE — Progress Notes (Signed)
Jeanette Jones   DOB:08/11/1955   [place]#:[id number]    Subjective: Patient continues to have intermittent abdominal pain. Continues to complain of difficulty swallowing regurgitation. Diarrhea improved. Overall feels stronger however not back to baseline. She is resting in the bed most of the time. She has just returned back with a CT scan.  Objective:  Vitals:   03/20/17 1300 03/20/17 1301  BP: (!) 89/63 94/60  Pulse: 77   Resp: 18   Temp: 98.3 F (36.8 C)   SpO2: 100%      Intake/Output Summary (Last 24 hours) at 03/20/17 1315 Last data filed at 03/20/17 0558  Gross per 24 hour  Intake          2759.58 ml  Output              300 ml  Net          2459.58 ml    GENERAL: Cachectic appearing female patient Alert, no distress and comfortable.   Alone. EYES: no pallor or icterus OROPHARYNX: no thrush or ulceration. NECK: supple, no masses felt LYMPH:  no palpable lymphadenopathy in the cervical, axillary or inguinal regions LUNGS: decreased breath sounds to auscultation at bases and  No wheeze or crackles HEART/CVS: regular rate & rhythm and no murmurs; No lower extremity edema ABDOMEN: abdomen soft, non-tender and normal bowel sounds Musculoskeletal:no cyanosis of digits and no clubbing  PSYCH: alert & oriented x 3 with fluent speech NEURO: no focal motor/sensory deficits SKIN:  no rashes or significant lesions   Labs:  Lab Results  Component Value Date   WBC 4.0 03/20/2017   HGB 8.6 (L) 03/20/2017   HCT 25.5 (L) 03/20/2017   MCV 89.7 03/20/2017   PLT 267 03/20/2017   NEUTROABS 2.6 03/19/2017    Lab Results  Component Value Date   NA 132 (L) 03/20/2017   K 3.4 (L) 03/20/2017   CL 109 03/20/2017   CO2 17 (L) 03/20/2017    Studies:  Ct Head Wo Contrast  Result Date: 03/19/2017 CLINICAL DATA:  New onset seizure.  Syncope.  Active lung cancer. EXAM: CT HEAD WITHOUT CONTRAST TECHNIQUE: Contiguous axial images were obtained from the base of the skull through the vertex  without intravenous contrast. COMPARISON:  No recent exams.  Remote head CT 02/07/2008 reviewed. FINDINGS: Brain: No intracranial hemorrhage, mass effect, or midline shift. No hydrocephalus. The basilar cisterns are patent. No evidence of territorial infarct or acute ischemia. No extra-axial or intracranial fluid collection. Vascular: Atherosclerosis of skullbase vasculature without hyperdense vessel or abnormal calcification. Skull: No fracture or suspicious lesion. Sclerotic focus about the inner table of the left temporal bone was seen on prior PET-CT 04/15/2015 and of doubtful clinical significance. Sinuses/Orbits: Paranasal sinuses and mastoid air cells are clear. The visualized orbits are unremarkable. Other: None. IMPRESSION: No evidence of acute abnormality or intracranial metastatic disease on CT. Consider brain MRI with contrast to assess for occult intracranial metastatic disease based on clinical concern. Electronically Signed   By: Jeb Levering M.D.   On: 03/19/2017 02:43   Ct Abdomen Pelvis W Contrast  Result Date: 03/20/2017 CLINICAL DATA:  61 year old female with a history of metastatic lung cancer. Difficulty swallowing and vomiting EXAM: CT ABDOMEN AND PELVIS WITH CONTRAST TECHNIQUE: Multidetector CT imaging of the abdomen and pelvis was performed using the standard protocol following bolus administration of intravenous contrast. CONTRAST:  94mL ISOVUE-300 IOPAMIDOL (ISOVUE-300) INJECTION 61% COMPARISON:  None. FINDINGS: Lower chest: Mass of the medial aspect of the  right lower lobe is unchanged from comparison PET-CT 02/12/2017. No acute finding of the lower chest. Unremarkable appearance of the visualized distal thoracic esophagus. Hepatobiliary: 6 mm hypodensity in the left liver on image 6, indeterminate though unchanged from CT of 11/19/2016. Unremarkable gallbladder. Pancreas: Unremarkable pancreas Spleen: Unremarkable spleen Adrenals/Urinary Tract: Unremarkable appearance of the  adrenal glands. Unremarkable appearance the bilateral kidneys with no hydronephrosis or nephrolithiasis. Unremarkable course the bilateral ureters. Unremarkable appearance of the urinary bladder. Stomach/Bowel: Unremarkable appearance of the stomach. Unremarkable small bowel with no abnormal distention. No focal wall thickening. No significant cecal wall thickening or inflammatory changes. Fluid filled colon, particularly of the splenic flexure, descending colon, sigmoid colon and rectum. Appendix is not visualized, however, no inflammatory changes are present adjacent to the cecum to indicate an appendicitis. Vascular/Lymphatic: Scattered calcifications of the abdominal aorta. Bilateral iliac and proximal femoral vasculature patent. Mesenteric vessels and bilateral renal arteries patent. Reproductive: Unremarkable at adnexa and uterus. Other: None Musculoskeletal: No acute displaced fracture. IMPRESSION: Fluid filled colon, potentially representing nonspecific enteritis/colitis. There are no regions of focal segmental colon wall thickening/inflammation to suggest typhlitis. No evidence of bowel obstruction. Mass of the right lower lobe, better characterized on recent prior PET-CT, compatible with known tumor. Aortic Atherosclerosis (ICD10-I70.0). Electronically Signed   By: Corrie Mckusick D.O.   On: 03/20/2017 13:11   Dg Chest Port 1 View  Result Date: 03/19/2017 CLINICAL DATA:  61 year old female with sepsis. History of right lower lobe lung cancer with chemotherapy. EXAM: PORTABLE CHEST 1 VIEW COMPARISON:  PET-CT dated 02/12/2017 FINDINGS: Left pectoral Port-A-Cath with tip over the central SVC close the cavoatrial junction. Focal area of opacity in the right upper lobe/ right suprahilar region noted corresponding to the mass seen on the prior PET-CT. Faint area of density projecting over the medial right lower lung field corresponds to the mass like density seen in the right lower lobe. The left lung is  clear. There is no pleural effusion or pneumothorax. The cardiac silhouette is within normal limits. No acute osseous pathology. IMPRESSION: Right upper and lower lobe opacities corresponding to the masses seen on the prior PET-CT. No new consolidative change. Electronically Signed   By: Anner Crete M.D.   On: 03/19/2017 01:02    Assessment & Plan:  61 year old female patient with metastatic lung cancer currently on single agent Abraxane is admitted to the hospital for-presumed sepsis  # Metastatic squamous cell lung cancer status post multiple lines of therapy- most recently on Abraxane single agent currently status post cycle #1. Patient seems to be tolerating chemotherapy poorly- significant diarrhea/nausea vomiting- acute renal failure/severe electrolyte abnormalities. Recommend holding further chemotherapy at this time.  # Difficulty swallowing/abdominal pain elevated lipase- question stricture versus others. Awaiting GI evaluation. Also await the CT scan.  # Acute renal failure/severe electrolyte abnormalities/hypotension- likely secondary to prerenal causes/diarrhea poor by mouth intake-today improved; creatinine today 0.83- sodium 132 potassium 3.4.  # Hypotension orthostatic- improved; awaiting cortisol levels today.; TSH slightly abnormal-likely secondary to critical illness.  # UTI-on culture on antibiotics. Discussed with Dr. Anselm Jungling.    Cammie Sickle, MD 03/20/2017  1:15 PM

## 2017-03-20 NOTE — Consult Note (Signed)
Consultation  Referring Provider:    Vaughan Basta, MD  Primary Care Physician:  [facility] [place] Primary Gastroenterologist:  Not applicable  Inpatient Consulting gastroenterologist: T. Madolyn Frieze, M.D.      Reason for Consultation:     Dysphagia, pancreatitis     Impression / Plan:   1. Dysphagia - Likely secondary to extrinsic compression from paratracheal and subcarinal lymphadenopathy from lung cancer vs. Intrinsic stenosis. Eating very pureed foods and soft meats per ST service. Plan EGD when clinically feasible with possible balloon dilation. May require esophageal stent at some point.  2. "Pancreatitis" - Pt has some abdominal discomfort, but diagnosis of pancreatitis unlikely given normal WBC. Possibly elevated lipase level secondary to gastric/salivary lipase fraction from vomiting and esophageal partial obstruction.  Will await CT results and resume diet if no significant radiographic findings.  3. Dehydration with syncope - Patient undergoing rehydration and tolerating clears well.  4. F/E/N - as above. May continue clears for now.  5. Will follow along. Thank you for this consultation.     HPI:   Jeanette Jones is a 61 y.o. female with a history of locally spread lung cancer presenting for hospital admission with dehydration and poor po intake. Patient has c/o increased dysphagia over the past 2 months with decreased intake of solids and liquids. Patient underwent barium swallow in mid-2017 showing decreased passage of barium tablet in the upper thoracic esophagus (long, smooth stricture). The patient previously underwent EGD in Jan 2017 performed by Dr. Verdie Shire with balloon dilation of the stricture to 80mm. Patient reports swallowing significantly improved for several months thereafter, but is now seemingly worse now than prior to her last esopahgeal dilation. Patient CT scan of the chest in July 2018 showed increased right lung opacity  consistent with hx of lung cancer with significant adenopathy of the subcarinal and paratracheal regions.  Patient c/o mild abdominal discomfort mainly related to recurrent retching. Does not drink alcohol. Has vomiting "about 80% of the time" due to dysphagia symptoms. No hemetemesis.  Past Medical History:  Diagnosis Date  . Back pain, chronic   . Carcinoma of lung ([place]) 07/2011  . Cough   . Depression   . Dyspnea   . Emphysema of lung ([place])   . H/O tubal ligation 02/12/2017   From chart  . Heart murmur   . Hep C w/o coma, chronic ([place])   . Hypertension   . Knee pain 02/12/2017  . Lung cancer ([place])   . Shoulder pain 02/09/2017  . Swallowing difficulty     Past Surgical History:  Procedure Laterality Date  . ESOPHAGOGASTRODUODENOSCOPY N/A 06/08/2015   Procedure: ESOPHAGOGASTRODUODENOSCOPY (EGD);  Surgeon: Hulen Luster, MD;  Location: [facility] ENDOSCOPY;  Service: Gastroenterology;  Laterality: N/A;  . HEMORROIDECTOMY    . LUNG BIOPSY    . TUBAL LIGATION      Family History  Problem Relation Age of Onset  . Hypertension Other   . Breast cancer Neg Hx     Social History  Substance Use Topics  . Smoking status: Current Every Day Smoker    Packs/day: 0.25    Years: 40.00    Types: Cigarettes  . Smokeless tobacco: Never Used  . Alcohol use Yes     Comment: beer on saturday    Prior to Admission medications   Medication Sig Start Date End Date Taking? Authorizing Provider  cyclobenzaprine (FLEXERIL) 10 MG tablet Take 10 mg by mouth at bedtime.  Yes [provider]  Fluticasone-Salmeterol (ADVAIR DISKUS) 500-50 MCG/DOSE AEPB Inhale 1 puff into the lungs 2 (two) times daily. 05/22/16  Yes Cammie Sickle, MD  ondansetron (ZOFRAN) 8 MG tablet Take 1 tablet (8 mg total) by mouth every 8 (eight) hours as needed for nausea or vomiting (start 3 days; after chemo). 05/22/16  Yes Cammie Sickle, MD  prochlorperazine (COMPAZINE) 10 MG tablet Take 1 tablet (10 mg  total) by mouth every 6 (six) hours as needed for nausea or vomiting. 05/22/16  Yes Cammie Sickle, MD  feeding supplement, ENSURE COMPLETE, (ENSURE COMPLETE) LIQD Take 237 mLs by mouth 2 (two) times daily between meals. 03/19/17   Vaughan Basta, MD  lidocaine-prilocaine (EMLA) cream Apply 1 application topically as needed. To port a cath site- 1 hr prior to port access Patient not taking: Reported on 03/08/2017 04/03/16   Cammie Sickle, MD  potassium chloride 20 MEQ/15ML (10%) SOLN Take 15 mLs (20 mEq total) by mouth 2 (two) times daily. Patient not taking: Reported on 02/22/2017 12/07/16   Cammie Sickle, MD  traMADol (ULTRAM) 50 MG tablet Take 1 tablet (50 mg total) by mouth every 8 (eight) hours as needed. Patient not taking: Reported on 03/08/2017 01/11/17   Cammie Sickle, MD    Current Facility-Administered Medications  Medication Dose Route Frequency Provider Last Rate Last Dose  . 0.9 % NaCl with KCl 40 mEq / L  infusion   Intravenous Continuous Vaughan Basta, MD 125 mL/hr at 03/20/17 0630 125 mL/hr at 03/20/17 0630  . acetaminophen (TYLENOL) tablet 650 mg  650 mg Oral Q6H PRN Harrie Foreman, MD       Or  . acetaminophen (TYLENOL) suppository 650 mg  650 mg Rectal Q6H PRN Harrie Foreman, MD      . alum & mag hydroxide-simeth (MAALOX/MYLANTA) 200-200-20 MG/5ML suspension 15 mL  15 mL Oral Q6H PRN Vaughan Basta, MD      . chlorhexidine (PERIDEX) 0.12 % solution 15 mL  15 mL Mouth Rinse BID Vaughan Basta, MD      . cyclobenzaprine (FLEXERIL) tablet 10 mg  10 mg Oral QHS Harrie Foreman, MD      . feeding supplement (ENSURE ENLIVE) (ENSURE ENLIVE) liquid 237 mL  237 mL Oral QID Vaughan Basta, MD      . iopamidol (ISOVUE-300) 61 % injection 75 mL  75 mL Intravenous Once PRN Vaughan Basta, MD      . magnesium sulfate IVPB 4 g 100 mL  4 g Intravenous Once Vaughan Basta, MD      . MEDLINE mouth  rinse  15 mL Mouth Rinse q12n4p Vaughan Basta, MD      . mometasone-formoterol [facility]) 200-5 MCG/ACT inhaler 2 puff  2 puff Inhalation BID Harrie Foreman, MD   2 puff at 03/20/17 0900  . multivitamin liquid 15 mL  15 mL Oral Daily Vaughan Basta, MD   15 mL at 03/20/17 1016  . ondansetron (ZOFRAN) tablet 4 mg  4 mg Oral Q6H PRN Harrie Foreman, MD       Or  . ondansetron [facility]) injection 4 mg  4 mg Intravenous Q6H PRN Harrie Foreman, MD   4 mg at 03/19/17 1706  . piperacillin-tazobactam (ZOSYN) IVPB 3.375 g  3.375 g Intravenous Q8H Vaughan Basta, MD 12.5 mL/hr at 03/20/17 1016 3.375 g at 03/20/17 1016    Allergies as of 03/19/2017 - Review Complete 03/19/2017  Allergen Reaction Noted  . Carboplatin  Anaphylaxis 08/17/2016     Review of Systems:    This is positive for those things mentioned in the HPI, also positive for cough and some SOB at rest. No chest pain. SIgnifcant for 7lb weight loss. . All other review of systems are negative.       Physical Exam:  Vital signs in last 24 hours: Temp:  [97.4 F (36.3 C)-98.2 F (36.8 C)] 97.4 F (36.3 C) (11/03 0457) Pulse Rate:  [80-97] 97 (11/03 0457) Resp:  [15-17] 17 (11/03 0457) BP: (96-115)/(60-73) 104/60 (11/03 0457) SpO2:  [98 %-100 %] 98 % (11/03 0457) Weight:  [50.4 kg (111 lb 3.2 oz)] 50.4 kg (111 lb 3.2 oz) (11/03 0457) Last BM Date: 03/20/17  General:  Well-developed, well-nourished and in no acute distress Eyes:  anicteric. ENT:   Mouth and posterior pharynx free of lesions.  Neck:   supple w/o thyromegaly or mass.  Lungs: Clear to auscultation bilaterally. Heart:  S1S2, no rubs, murmurs, gallops. Abdomen:  soft, very mildly tender, no rebound, guarding,  hepatosplenomegaly, hernia, or mass and BS+.  Rectal: Deferred Lymph:  no cervical or supraclavicular adenopathy. Extremities:   no edema Skin   no rash. Neuro:  A&O x 3.  Psych:  appropriate mood and  Affect.   Data  Reviewed:   LAB RESULTS:  Recent Labs  03/19/17 0041 03/20/17 0627  WBC 4.6 4.0  HGB 11.5* 8.6*  HCT 33.0* 25.5*  PLT 326 267   BMET  Recent Labs  03/19/17 0040 03/20/17 0627  NA 121* 132*  K 2.4* 3.4*  CL 90* 109  CO2 16* 17*  GLUCOSE 99 118*  BUN 79* 30*  CREATININE 2.22* 0.83  CALCIUM 8.7* 8.1*   LFT  Recent Labs  03/20/17 0627  PROT 6.9  ALBUMIN 2.5*  AST 71*  ALT 32  ALKPHOS 67  BILITOT 0.5   PT/INR  Recent Labs  03/19/17 0121  LABPROT 14.7  INR 1.16    STUDIES: Ct Head Wo Contrast  Result Date: 03/19/2017 CLINICAL DATA:  New onset seizure.  Syncope.  Active lung cancer. EXAM: CT HEAD WITHOUT CONTRAST TECHNIQUE: Contiguous axial images were obtained from the base of the skull through the vertex without intravenous contrast. COMPARISON:  No recent exams.  Remote head CT 02/07/2008 reviewed. FINDINGS: Brain: No intracranial hemorrhage, mass effect, or midline shift. No hydrocephalus. The basilar cisterns are patent. No evidence of territorial infarct or acute ischemia. No extra-axial or intracranial fluid collection. Vascular: Atherosclerosis of skullbase vasculature without hyperdense vessel or abnormal calcification. Skull: No fracture or suspicious lesion. Sclerotic focus about the inner table of the left temporal bone was seen on prior PET-CT 04/15/2015 and of doubtful clinical significance. Sinuses/Orbits: Paranasal sinuses and mastoid air cells are clear. The visualized orbits are unremarkable. Other: None. IMPRESSION: No evidence of acute abnormality or intracranial metastatic disease on CT. Consider brain MRI with contrast to assess for occult intracranial metastatic disease based on clinical concern. Electronically Signed   By: Jeb Levering M.D.   On: 03/19/2017 02:43   Dg Chest Port 1 View  Result Date: 03/19/2017 CLINICAL DATA:  61 year old female with sepsis. History of right lower lobe lung cancer with chemotherapy. EXAM: PORTABLE CHEST 1  VIEW COMPARISON:  PET-CT dated 02/12/2017 FINDINGS: Left pectoral Port-A-Cath with tip over the central SVC close the cavoatrial junction. Focal area of opacity in the right upper lobe/ right suprahilar region noted corresponding to the mass seen on the prior PET-CT. Faint area of density  projecting over the medial right lower lung field corresponds to the mass like density seen in the right lower lobe. The left lung is clear. There is no pleural effusion or pneumothorax. The cardiac silhouette is within normal limits. No acute osseous pathology. IMPRESSION: Right upper and lower lobe opacities corresponding to the masses seen on the prior PET-CT. No new consolidative change. Electronically Signed   By: Anner Crete M.D.   On: 03/19/2017 01:02     PREVIOUS ENDOSCOPIES:            As in HPI   Thanks   LOS: 1 day   @T . Madolyn Frieze, M.D. @  03/20/2017, 12:16 PM

## 2017-03-21 LAB — URINE CULTURE: Culture: >=100000 — AB

## 2017-03-21 LAB — PHOSPHORUS: PHOSPHORUS: 2.7 mg/dL (ref 2.5–4.6)

## 2017-03-21 LAB — CBC
HEMATOCRIT: 23.9 % — AB (ref 35.0–47.0)
Hemoglobin: 8.2 g/dL — ABNORMAL LOW (ref 12.0–16.0)
MCH: 30.7 pg (ref 26.0–34.0)
MCHC: 34.3 g/dL (ref 32.0–36.0)
MCV: 89.6 fL (ref 80.0–100.0)
PLATELETS: 266 10*3/uL (ref 150–440)
RBC: 2.66 MIL/uL — ABNORMAL LOW (ref 3.80–5.20)
RDW: 17.1 % — AB (ref 11.5–14.5)
WBC: 4.9 10*3/uL (ref 3.6–11.0)

## 2017-03-21 LAB — BASIC METABOLIC PANEL
Anion gap: 3 — ABNORMAL LOW (ref 5–15)
BUN: 8 mg/dL (ref 6–20)
CALCIUM: 7.7 mg/dL — AB (ref 8.9–10.3)
CO2: 18 mmol/L — ABNORMAL LOW (ref 22–32)
Chloride: 109 mmol/L (ref 101–111)
Creatinine, Ser: 0.6 mg/dL (ref 0.44–1.00)
GFR calc Af Amer: >60 mL/min (ref >60)
GLUCOSE: 116 mg/dL — AB (ref 65–99)
POTASSIUM: 4.2 mmol/L (ref 3.5–5.1)
SODIUM: 130 mmol/L — AB (ref 135–145)

## 2017-03-21 LAB — CORTISOL: CORTISOL PLASMA: 18.7 ug/dL

## 2017-03-21 LAB — LIPASE, BLOOD: LIPASE: 125 U/L — AB (ref 11–51)

## 2017-03-21 LAB — HEPATITIS C ANTIBODY

## 2017-03-21 LAB — MAGNESIUM: MAGNESIUM: 1.8 mg/dL (ref 1.7–2.4)

## 2017-03-21 MED ORDER — SALINE SPRAY 0.65 % NA SOLN
1.0000 | NASAL | Status: DC | PRN
  Administered 2017-03-21: 03:00:00 1 via NASAL
  Filled 2017-03-21: qty 44

## 2017-03-21 MED ORDER — SODIUM PHOSPHATES 45 MMOLE/15ML IV SOLN
30.0000 mmol | Freq: Once | INTRAVENOUS | Status: AC
  Administered 2017-03-21: 11:00:00 30 mmol via INTRAVENOUS
  Filled 2017-03-21: qty 10

## 2017-03-21 NOTE — Progress Notes (Signed)
[facility] [place] NAME: Jeanette Jones    MR#:  [id number]  DATE OF BIRTH:  04-06-1956  SUBJECTIVE:  CHIEF COMPLAINT:   Chief Complaint  Patient presents with  . Loss of Consciousness    Metastatic cancer, have difficulty swallowing food and meds and having nausea. Came with severe dehydration and suspected septic.   Have abd pain, and vomited last night after eating food.  REVIEW OF SYSTEMS:  CONSTITUTIONAL: No fever,positive for fatigue or weakness.  EYES: No blurred or double vision.  EARS, NOSE, AND THROAT: No tinnitus or ear pain.  RESPIRATORY: No cough, shortness of breath, wheezing or hemoptysis.  CARDIOVASCULAR: No chest pain, orthopnea, edema.  GASTROINTESTINAL: No nausea, vomiting, diarrhea or abdominal pain.  GENITOURINARY: No dysuria, hematuria.  ENDOCRINE: No polyuria, nocturia,  HEMATOLOGY: No anemia, easy bruising or bleeding SKIN: No rash or lesion. MUSCULOSKELETAL: No joint pain or arthritis.   NEUROLOGIC: No tingling, numbness, weakness.  PSYCHIATRY: No anxiety or depression.   ROS  DRUG ALLERGIES:   Allergies  Allergen Reactions  . Carboplatin Anaphylaxis    VITALS:  Blood pressure (!) 101/58, pulse 94, temperature 99.5 F (37.5 C), temperature source Oral, resp. rate 18, height 5\' 4"  (1.626 m), weight 50.4 kg (111 lb 3.2 oz), SpO2 100 %.  PHYSICAL EXAMINATION:  GENERAL:  61 y.o.-year-old thin patient lying in the bed with no acute distress.  EYES: Pupils equal, round, reactive to light and accommodation. No scleral icterus. Extraocular muscles intact.  HEENT: Head atraumatic, normocephalic. Oropharynx and nasopharynx clear.  NECK:  Supple, no jugular venous distention. No thyroid enlargement, no tenderness.  LUNGS: Normal breath sounds bilaterally, no wheezing, rales,rhonchi or crepitation. No use of accessory muscles of respiration.  CARDIOVASCULAR: S1, S2 normal. No murmurs, rubs, or gallops.  ABDOMEN:  Soft, nontender, nondistended. Bowel sounds present. No organomegaly or mass.  EXTREMITIES: No pedal edema, cyanosis, or clubbing.  NEUROLOGIC: Cranial nerves II through XII are intact. Muscle strength 4/5 in all extremities. Sensation intact. Gait not checked.  PSYCHIATRIC: The patient is alert and oriented x 3.  SKIN: No obvious rash, lesion, or ulcer.   Physical Exam LABORATORY PANEL:   CBC  Recent Labs Lab 03/20/17 0627  WBC 4.0  HGB 8.6*  HCT 25.5*  PLT 267   ------------------------------------------------------------------------------------------------------------------  Chemistries   Recent Labs Lab 03/20/17 0627  NA 132*  K 3.4*  CL 109  CO2 17*  GLUCOSE 118*  BUN 30*  CREATININE 0.83  CALCIUM 8.1*  MG 2.0  AST 71*  ALT 32  ALKPHOS 67  BILITOT 0.5   ------------------------------------------------------------------------------------------------------------------  Cardiac Enzymes  Recent Labs Lab 03/19/17 0041  TROPONINI <0.03   ------------------------------------------------------------------------------------------------------------------  RADIOLOGY:  Ct Head Wo Contrast  Result Date: 03/19/2017 CLINICAL DATA:  New onset seizure.  Syncope.  Active lung cancer. EXAM: CT HEAD WITHOUT CONTRAST TECHNIQUE: Contiguous axial images were obtained from the base of the skull through the vertex without intravenous contrast. COMPARISON:  No recent exams.  Remote head CT 02/07/2008 reviewed. FINDINGS: Brain: No intracranial hemorrhage, mass effect, or midline shift. No hydrocephalus. The basilar cisterns are patent. No evidence of territorial infarct or acute ischemia. No extra-axial or intracranial fluid collection. Vascular: Atherosclerosis of skullbase vasculature without hyperdense vessel or abnormal calcification. Skull: No fracture or suspicious lesion. Sclerotic focus about the inner table of the left temporal bone was seen on prior PET-CT 04/15/2015 and of  doubtful clinical significance. Sinuses/Orbits: Paranasal sinuses and mastoid air  cells are clear. The visualized orbits are unremarkable. Other: None. IMPRESSION: No evidence of acute abnormality or intracranial metastatic disease on CT. Consider brain MRI with contrast to assess for occult intracranial metastatic disease based on clinical concern. Electronically Signed   By: Jeb Levering M.D.   On: 03/19/2017 02:43   Ct Abdomen Pelvis W Contrast  Result Date: 03/20/2017 CLINICAL DATA:  61 year old female with a history of metastatic lung cancer. Difficulty swallowing and vomiting EXAM: CT ABDOMEN AND PELVIS WITH CONTRAST TECHNIQUE: Multidetector CT imaging of the abdomen and pelvis was performed using the standard protocol following bolus administration of intravenous contrast. CONTRAST:  25mL ISOVUE-300 IOPAMIDOL (ISOVUE-300) INJECTION 61% COMPARISON:  None. FINDINGS: Lower chest: Mass of the medial aspect of the right lower lobe is unchanged from comparison PET-CT 02/12/2017. No acute finding of the lower chest. Unremarkable appearance of the visualized distal thoracic esophagus. Hepatobiliary: 6 mm hypodensity in the left liver on image 6, indeterminate though unchanged from CT of 11/19/2016. Unremarkable gallbladder. Pancreas: Unremarkable pancreas Spleen: Unremarkable spleen Adrenals/Urinary Tract: Unremarkable appearance of the adrenal glands. Unremarkable appearance the bilateral kidneys with no hydronephrosis or nephrolithiasis. Unremarkable course the bilateral ureters. Unremarkable appearance of the urinary bladder. Stomach/Bowel: Unremarkable appearance of the stomach. Unremarkable small bowel with no abnormal distention. No focal wall thickening. No significant cecal wall thickening or inflammatory changes. Fluid filled colon, particularly of the splenic flexure, descending colon, sigmoid colon and rectum. Appendix is not visualized, however, no inflammatory changes are present adjacent to the  cecum to indicate an appendicitis. Vascular/Lymphatic: Scattered calcifications of the abdominal aorta. Bilateral iliac and proximal femoral vasculature patent. Mesenteric vessels and bilateral renal arteries patent. Reproductive: Unremarkable at adnexa and uterus. Other: None Musculoskeletal: No acute displaced fracture. IMPRESSION: Fluid filled colon, potentially representing nonspecific enteritis/colitis. There are no regions of focal segmental colon wall thickening/inflammation to suggest typhlitis. No evidence of bowel obstruction. Mass of the right lower lobe, better characterized on recent prior PET-CT, compatible with known tumor. Aortic Atherosclerosis (ICD10-I70.0). Electronically Signed   By: Corrie Mckusick D.O.   On: 03/20/2017 13:11   Dg Chest Port 1 View  Result Date: 03/19/2017 CLINICAL DATA:  61 year old female with sepsis. History of right lower lobe lung cancer with chemotherapy. EXAM: PORTABLE CHEST 1 VIEW COMPARISON:  PET-CT dated 02/12/2017 FINDINGS: Left pectoral Port-A-Cath with tip over the central SVC close the cavoatrial junction. Focal area of opacity in the right upper lobe/ right suprahilar region noted corresponding to the mass seen on the prior PET-CT. Faint area of density projecting over the medial right lower lung field corresponds to the mass like density seen in the right lower lobe. The left lung is clear. There is no pleural effusion or pneumothorax. The cardiac silhouette is within normal limits. No acute osseous pathology. IMPRESSION: Right upper and lower lobe opacities corresponding to the masses seen on the prior PET-CT. No new consolidative change. Electronically Signed   By: Anner Crete M.D.   On: 03/19/2017 01:02    ASSESSMENT AND PLAN:   Active Problems:   Sepsis ([place])   * sepsis    Ur cx have Gram neg rods growing.   Pt is very dehydrated. And hypotensive due to that.   She have dysphagia and nausea and vomit.    She may have some aspiration  pneumonia.   Will give zosyn. But may taper quickly.   IV fluids. Follow cultures.  * Dehydration   Ac renal failure   IV fluids.  * hypotension  IV fluid bolus given.   Have orthostatic BP drop, cont IV fluids.  * Lung cancer    Called Oncology consult.   She have RU lobe mass.    * Dysphagia   Likely may have some compression due to the mass in R U lobe.   SLP eval.    In Sept 2017- had esophageal stricture, Dilation done.    Called GI consult. Get Barium swallow.  * acute pancreatitis   High lipase   Nausea   IV fluids , get CT abd with contrast.   IV fluids.  * Hypokalemia   IV fluids for replacement.    Mg checked, normal.   All the records are reviewed and case discussed with Care Management/Social Workerr. Management plans discussed with the patient, family and they are in agreement.  CODE STATUS: full.  TOTAL TIME TAKING CARE OF THIS PATIENT: 45 minutes.  Spoke to her daughter in room.  POSSIBLE D/C IN 2-3 DAYS, DEPENDING ON CLINICAL CONDITION.   Vaughan Basta M.D on 03/21/2017   Between 7am to 6pm - Pager - [phone]  After 6pm go to [email or web address] - password [email or web address] [facility]  Office  [phone]  CC: Primary care physician; [facility], [place]  Note: This dictation was prepared with Diplomatic Services operational officer dictation along with smaller phrase technology. Any transcriptional errors that result from this process are unintentional.

## 2017-03-21 NOTE — Progress Notes (Signed)
[facility] NAME: Jeanette Jones    MR#:  [id number]  DATE OF BIRTH:  10/29/1955  SUBJECTIVE:  CHIEF COMPLAINT:   Chief Complaint  Patient presents with  . Loss of Consciousness    Metastatic cancer, have difficulty swallowing food and meds and having nausea. Came with severe dehydration and suspected septic. Patient is febrile today, temperature 100.7.  Still having difficulty with swallowing ,reporting dark colored stool, tolerating liquids    REVIEW OF SYSTEMS:  CONSTITUTIONAL: No fever,positive for fatigue or weakness.  EYES: No blurred or double vision.  EARS, NOSE, AND THROAT: No tinnitus or ear pain.  Reporting difficulty with swallowing or RESPIRATORY: No cough, shortness of breath, wheezing or hemoptysis.  CARDIOVASCULAR: No chest pain, orthopnea, edema.  GASTROINTESTINAL: No nausea, vomiting, diarrhea or abdominal pain.  GENITOURINARY: No dysuria, hematuria.  ENDOCRINE: No polyuria, nocturia,  HEMATOLOGY: No anemia, easy bruising or bleeding SKIN: No rash or lesion. MUSCULOSKELETAL: No joint pain or arthritis.   NEUROLOGIC: No tingling, numbness, weakness.  PSYCHIATRY: No anxiety or depression.   ROS  DRUG ALLERGIES:   Allergies  Allergen Reactions  . Carboplatin Anaphylaxis    VITALS:  Blood pressure 106/68, pulse 73, temperature 99 F (37.2 C), temperature source Oral, resp. rate 18, height 5\' 4"  (1.626 m), weight 52.7 kg (116 lb 3.2 oz), SpO2 93 %.  PHYSICAL EXAMINATION:  GENERAL:  61 y.o.-year-old thin patient lying in the bed with no acute distress.  EYES: Pupils equal, round, reactive to light and accommodation. No scleral icterus. Extraocular muscles intact.  HEENT: Head atraumatic, normocephalic. Oropharynx and nasopharynx clear.  NECK:  Supple, no jugular venous distention. No thyroid enlargement, no tenderness.  LUNGS: Normal breath sounds bilaterally, no wheezing, rales,rhonchi or crepitation. No use  of accessory muscles of respiration.  CARDIOVASCULAR: S1, S2 normal. No murmurs, rubs, or gallops.  ABDOMEN: Soft, nontender, nondistended. Bowel sounds present. No organomegaly or mass.  EXTREMITIES: No pedal edema, cyanosis, or clubbing.  NEUROLOGIC: Cranial nerves II through XII are intact. Muscle strength 4/5 in all extremities. Sensation intact. Gait not checked.  PSYCHIATRIC: The patient is alert and oriented x 3.  SKIN: No obvious rash, lesion, or ulcer.   Physical Exam LABORATORY PANEL:   CBC Recent Labs  Lab 03/21/17 0456  WBC 4.9  HGB 8.2*  HCT 23.9*  PLT 266   ------------------------------------------------------------------------------------------------------------------  Chemistries  Recent Labs  Lab 03/20/17 0627 03/21/17 0456  NA 132* 130*  K 3.4* 4.2  CL 109 109  CO2 17* 18*  GLUCOSE 118* 116*  BUN 30* 8  CREATININE 0.83 0.60  CALCIUM 8.1* 7.7*  MG 2.0 1.8  AST 71*  --   ALT 32  --   ALKPHOS 67  --   BILITOT 0.5  --    ------------------------------------------------------------------------------------------------------------------  Cardiac Enzymes Recent Labs  Lab 03/19/17 0041  TROPONINI <0.03   ------------------------------------------------------------------------------------------------------------------  RADIOLOGY:  Ct Abdomen Pelvis W Contrast  Result Date: 03/20/2017 CLINICAL DATA:  61 year old female with a history of metastatic lung cancer. Difficulty swallowing and vomiting EXAM: CT ABDOMEN AND PELVIS WITH CONTRAST TECHNIQUE: Multidetector CT imaging of the abdomen and pelvis was performed using the standard protocol following bolus administration of intravenous contrast. CONTRAST:  45mL ISOVUE-300 IOPAMIDOL (ISOVUE-300) INJECTION 61% COMPARISON:  None. FINDINGS: Lower chest: Mass of the medial aspect of the right lower lobe is unchanged from comparison PET-CT 02/12/2017. No acute finding of the lower chest. Unremarkable appearance of  the visualized  distal thoracic esophagus. Hepatobiliary: 6 mm hypodensity in the left liver on image 6, indeterminate though unchanged from CT of 11/19/2016. Unremarkable gallbladder. Pancreas: Unremarkable pancreas Spleen: Unremarkable spleen Adrenals/Urinary Tract: Unremarkable appearance of the adrenal glands. Unremarkable appearance the bilateral kidneys with no hydronephrosis or nephrolithiasis. Unremarkable course the bilateral ureters. Unremarkable appearance of the urinary bladder. Stomach/Bowel: Unremarkable appearance of the stomach. Unremarkable small bowel with no abnormal distention. No focal wall thickening. No significant cecal wall thickening or inflammatory changes. Fluid filled colon, particularly of the splenic flexure, descending colon, sigmoid colon and rectum. Appendix is not visualized, however, no inflammatory changes are present adjacent to the cecum to indicate an appendicitis. Vascular/Lymphatic: Scattered calcifications of the abdominal aorta. Bilateral iliac and proximal femoral vasculature patent. Mesenteric vessels and bilateral renal arteries patent. Reproductive: Unremarkable at adnexa and uterus. Other: None Musculoskeletal: No acute displaced fracture. IMPRESSION: Fluid filled colon, potentially representing nonspecific enteritis/colitis. There are no regions of focal segmental colon wall thickening/inflammation to suggest typhlitis. No evidence of bowel obstruction. Mass of the right lower lobe, better characterized on recent prior PET-CT, compatible with known tumor. Aortic Atherosclerosis (ICD10-I70.0). Electronically Signed   By: Corrie Mckusick D.O.   On: 03/20/2017 13:11    ASSESSMENT AND PLAN:   Active Problems:   Sepsis ([place])   * sepsis secondary to UTI and possible postobstructive pneumonia    Ur cx have Gram neg rods E. coli which is pansensitive Blood cultures are negative so far Patient is being still febrile will continue IV Zosyn and will switch to narrow  spectrum antibiotic once patient is afebrile  Supportive treatment with IV fluids  *Acute renal failure secondary to dehydration Clinically improving with IV fluids.  * hypotension   IV fluid bolus given.   Have orthostatic  hypotension, cont IV fluids.  *Metastatic squamous cell lung cancer status post multiple lines of therapy  lung cancer with right upper lobe mass possible postobstructive pneumonia Oncology Dr. Seward Meth is following recommending to, hold further chemotherapy as patient is tolerating chemo poorly    * Dysphagia   Likely from extrinsic compression from the paratracheal and some cranial lymphadenopathy from lung cancer    SLP eval. patient is tolerating-pured diet and soft meats    In Sept 2017- had esophageal stricture, Dilation done. Scheduled for barium swallow in a.m. and follow-up with gastroenterology for possible EGD   * acute pancreatitis with elevated lipase and nausea Tolerating pured diet Patient was seen and evaluated by gastroenterology,  does not think  that the patient has pancreatitis Repeat lipase in a.m. CT abdomen revealed fluid-filled colon, no bowel obstruction, right lower lobe mass, recommending PET scan for better characterization-outpatient follow-up with oncology after discharge  * Hypokalemia Replete and recheck potassium and magnesium levels in a.m.  *Dark colored stool?  Melena Check stool for Hemoccult follow-up with GI,  All the records are reviewed and case discussed with Care Management/Social Workerr. Management plans discussed with the patient, family and they are in agreement.  CODE STATUS: full.  TOTAL TIME TAKING CARE OF THIS PATIENT: 45 minutes.  Spoke to her daughter in room.  POSSIBLE D/C IN 2-3 DAYS, DEPENDING ON CLINICAL CONDITION.   Nicholes Mango M.D on 03/21/2017   Between 7am to 6pm - Pager - [phone]  After 6pm go to [email or web address] - password [email or web address] [facility]  Office   [phone]  CC: Primary care physician; [facility], [place]  Note: This dictation was prepared with Diplomatic Services operational officer dictation along  with smaller phrase technology. Any transcriptional errors that result from this process are unintentional.

## 2017-03-21 NOTE — Consult Note (Signed)
MEDICATION RELATED CONSULT NOTE - INITIAL   Pharmacy Consult for electrolytes Indication: low phosphours  Allergies  Allergen Reactions  . Carboplatin Anaphylaxis    Patient Measurements: Height: 5\' 4"  (162.6 cm) Weight: 116 lb 3.2 oz (52.7 kg) IBW/kg (Calculated) : 54.7 Adjusted Body Weight:   Vital Signs: Temp: 99 F (37.2 C) (11/04 1724) Temp Source: Oral (11/04 1724) BP: 106/68 (11/04 1724) Pulse Rate: 73 (11/04 1724) Intake/Output from previous day: 11/03 0701 - 11/04 0700 In: 810 [P.O.:760; IV Piggyback:50] Out: -  Intake/Output from this shift: No intake/output data recorded.  Labs: Recent Labs    03/19/17 0040 03/19/17 0041 03/20/17 0627 03/21/17 0456 03/21/17 1720  WBC  --  4.6 4.0 4.9  --   HGB  --  11.5* 8.6* 8.2*  --   HCT  --  33.0* 25.5* 23.9*  --   PLT  --  326 267 266  --   CREATININE 2.22*  --  0.83 0.60  --   MG 2.3  --  2.0 1.8  --   PHOS  --   --  1.3* <1.0* 2.7  ALBUMIN 3.4*  --  2.5*  --   --   PROT 9.5*  --  6.9  --   --   AST 80*  --  71*  --   --   ALT 38  --  32  --   --   ALKPHOS 67  --  67  --   --   BILITOT 0.6  --  0.5  --   --    Estimated Creatinine Clearance: 63 mL/min (by C-G formula based on SCr of 0.6 mg/dL).   Microbiology: Recent Results (from the past 720 hour(s))  Blood culture (routine x 2)     Status: None (Preliminary result)   Collection Time: 03/19/17 12:41 AM  Result Value Ref Range Status   Specimen Description BLOOD RIGHT ANTECUBITAL  Final   Special Requests   Final    BOTTLES DRAWN AEROBIC AND ANAEROBIC Blood Culture adequate volume   Culture NO GROWTH 2 DAYS  Final   Report Status PENDING  Incomplete  Urine culture     Status: Abnormal   Collection Time: 03/19/17 12:41 AM  Result Value Ref Range Status   Specimen Description URINE, RANDOM  Final   Special Requests NONE  Final   Culture (A)  Final    >=100,000 COLONIES/mL ESCHERICHIA COLI >=100,000 COLONIES/mL LACTOBACILLUS SPECIES Standardized  susceptibility testing for this organism is not available. Performed at [facility], [place] [address] [place], [place] [address]    Report Status 03/21/2017 FINAL  Final   Organism ID, Bacteria ESCHERICHIA COLI (A)  Final      Susceptibility   Escherichia coli - MIC*    AMPICILLIN <=2 SENSITIVE Sensitive     CEFAZOLIN <=4 SENSITIVE Sensitive     CEFTRIAXONE <=1 SENSITIVE Sensitive     CIPROFLOXACIN <=0.25 SENSITIVE Sensitive     GENTAMICIN <=1 SENSITIVE Sensitive     IMIPENEM <=0.25 SENSITIVE Sensitive     NITROFURANTOIN <=16 SENSITIVE Sensitive     TRIMETH/SULFA <=20 SENSITIVE Sensitive     AMPICILLIN/SULBACTAM <=2 SENSITIVE Sensitive     PIP/TAZO <=4 SENSITIVE Sensitive     Extended ESBL NEGATIVE Sensitive     * >=100,000 COLONIES/mL ESCHERICHIA COLI  Blood culture (routine x 2)     Status: None (Preliminary result)   Collection Time: 03/19/17 12:42 AM  Result Value Ref Range Status  Specimen Description BLOOD LEFT ANTECUBITAL  Final   Special Requests   Final    BOTTLES DRAWN AEROBIC AND ANAEROBIC Blood Culture adequate volume   Culture NO GROWTH 2 DAYS  Final   Report Status PENDING  Incomplete  C difficile quick scan w PCR reflex     Status: None   Collection Time: 03/20/17  3:42 AM  Result Value Ref Range Status   C Diff antigen NEGATIVE NEGATIVE Final   C Diff toxin NEGATIVE NEGATIVE Final   C Diff interpretation No C. difficile detected.  Final  Gastrointestinal Panel by PCR , Stool     Status: None   Collection Time: 03/20/17  3:42 AM  Result Value Ref Range Status   Campylobacter species NOT DETECTED NOT DETECTED Final   Plesimonas shigelloides NOT DETECTED NOT DETECTED Final   Salmonella species NOT DETECTED NOT DETECTED Final   Yersinia enterocolitica NOT DETECTED NOT DETECTED Final   Vibrio species NOT DETECTED NOT DETECTED Final   Vibrio cholerae NOT DETECTED NOT DETECTED Final   Enteroaggregative E coli (EAEC) NOT DETECTED NOT DETECTED Final    Enteropathogenic E coli (EPEC) NOT DETECTED NOT DETECTED Final   Enterotoxigenic E coli (ETEC) NOT DETECTED NOT DETECTED Final   Shiga like toxin producing E coli (STEC) NOT DETECTED NOT DETECTED Final   Shigella/Enteroinvasive E coli (EIEC) NOT DETECTED NOT DETECTED Final   Cryptosporidium NOT DETECTED NOT DETECTED Final   Cyclospora cayetanensis NOT DETECTED NOT DETECTED Final   Entamoeba histolytica NOT DETECTED NOT DETECTED Final   Giardia lamblia NOT DETECTED NOT DETECTED Final   Adenovirus F40/41 NOT DETECTED NOT DETECTED Final   Astrovirus NOT DETECTED NOT DETECTED Final   Norovirus GI/GII NOT DETECTED NOT DETECTED Final   Rotavirus A NOT DETECTED NOT DETECTED Final   Sapovirus (I, II, IV, and V) NOT DETECTED NOT DETECTED Final    Medical History: Past Medical History:  Diagnosis Date  . Back pain, chronic   . Carcinoma of lung ([place]) 07/2011  . Cough   . Depression   . Dyspnea   . Emphysema of lung ([place])   . H/O tubal ligation 02/12/2017   From chart  . Heart murmur   . Hep C w/o coma, chronic ([place])   . Hypertension   . Knee pain 02/12/2017  . Lung cancer ([place])   . Shoulder pain 02/09/2017  . Swallowing difficulty     Medications:  Scheduled:  . chlorhexidine  15 mL Mouth Rinse BID  . cyclobenzaprine  10 mg Oral QHS  . feeding supplement (ENSURE ENLIVE)  237 mL Oral QID  . mouth rinse  15 mL Mouth Rinse q12n4p  . mometasone-formoterol  2 puff Inhalation BID  . multivitamin  15 mL Oral Daily    Assessment: Patient is a 61 year old female with dysphagia, dehydration, abdominal pain, diarrhea. Pt had low K and Mg on admission which since has been replaced. Patient Phos found to be <1 this AM. K=4.2 Mg=1.8 Phos= <1  Goal of Therapy:  K=3.5-5 Mg=1.8-24 Phos=2.5-4.6  Plan:  Patient currently has NS w/ 40 KCL running at 179ml/hr (5 MEQ/hr), therefore I will give sodium phosphate 30 mmol x 1 and recheck 1 hr after infusion.  11/4 1720 repeat phos 2.7. No  further supplement warranted at this time. Will recheck electrolytes tomorrow with AM labs.  Ariaunna Longsworth A. [place], [place].D, BCPS Clinical Pharmacist  03/21/2017,7:25 PM

## 2017-03-21 NOTE — Progress Notes (Signed)
Subjective: Patient seen for f/u dysphagia and ?pancreatitis. CT abdomen/pelvis neg for pancreatitis and wbc normal. Patient hungry and would like advancing diet from liquids.  Objective: Vital signs in last 24 hours: Temp:  [97.6 F (36.4 C)-100.7 F (38.2 C)] 97.6 F (36.4 C) (11/04 1110) Pulse Rate:  [75-94] 75 (11/04 1110) Resp:  [18] 18 (11/04 1110) BP: (95-111)/(58-61) 111/61 (11/04 1110) SpO2:  [92 %-100 %] 92 % (11/04 1110) Weight:  [52.7 kg (116 lb 3.2 oz)] 52.7 kg (116 lb 3.2 oz) (11/04 0531) Blood pressure 111/61, pulse 75, temperature 97.6 F (36.4 C), temperature source Axillary, resp. rate 18, height 5\' 4"  (1.626 m), weight 52.7 kg (116 lb 3.2 oz), SpO2 92 %.   Intake/Output from previous day: 11/03 0701 - 11/04 0700 In: 3 [P.O.:760; IV Piggyback:50] Out: -   Intake/Output this shift: Total I/O In: 240 [P.O.:240] Out: -    General appearance:  Alert, NAD Resp:  Rel CTA Cardio:  RRR no gallop GI: Abd soft, mildly tender without rebound or masses. BS+ Extremities:  NO edema.   Lab Results: Results for orders placed or performed during the hospital encounter of 03/19/17 (from the past 24 hour(s))  Phosphorus     Status: Abnormal   Collection Time: 03/21/17  4:56 AM  Result Value Ref Range   Phosphorus <1.0 (LL) 2.5 - 4.6 mg/dL  Magnesium     Status: None   Collection Time: 03/21/17  4:56 AM  Result Value Ref Range   Magnesium 1.8 1.7 - 2.4 mg/dL  Basic metabolic panel     Status: Abnormal   Collection Time: 03/21/17  4:56 AM  Result Value Ref Range   Sodium 130 (L) 135 - 145 mmol/L   Potassium 4.2 3.5 - 5.1 mmol/L   Chloride 109 101 - 111 mmol/L   CO2 18 (L) 22 - 32 mmol/L   Glucose, Bld 116 (H) 65 - 99 mg/dL   BUN 8 6 - 20 mg/dL   Creatinine, Ser 0.60 0.44 - 1.00 mg/dL   Calcium 7.7 (L) 8.9 - 10.3 mg/dL   GFR calc non Af Amer >60 >60 mL/min   GFR calc Af Amer >60 >60 mL/min   Anion gap 3 (L) 5 - 15  CBC     Status: Abnormal   Collection Time:  03/21/17  4:56 AM  Result Value Ref Range   WBC 4.9 3.6 - 11.0 K/uL   RBC 2.66 (L) 3.80 - 5.20 MIL/uL   Hemoglobin 8.2 (L) 12.0 - 16.0 g/dL   HCT 23.9 (L) 35.0 - 47.0 %   MCV 89.6 80.0 - 100.0 fL   MCH 30.7 26.0 - 34.0 pg   MCHC 34.3 32.0 - 36.0 g/dL   RDW 17.1 (H) 11.5 - 14.5 %   Platelets 266 150 - 440 K/uL  Lipase, blood     Status: Abnormal   Collection Time: 03/21/17  4:56 AM  Result Value Ref Range   Lipase 125 (H) 11 - 51 U/L     Recent Labs    03/19/17 0041 03/20/17 0627 03/21/17 0456  WBC 4.6 4.0 4.9  HGB 11.5* 8.6* 8.2*  HCT 33.0* 25.5* 23.9*  PLT 326 267 266   BMET Recent Labs    03/19/17 0040 03/20/17 0627 03/21/17 0456  NA 121* 132* 130*  K 2.4* 3.4* 4.2  CL 90* 109 109  CO2 16* 17* 18*  GLUCOSE 99 118* 116*  BUN 79* 30* 8  CREATININE 2.22* 0.83 0.60  CALCIUM 8.7*  8.1* 7.7*   LFT Recent Labs    03/20/17 0627  PROT 6.9  ALBUMIN 2.5*  AST 71*  ALT 32  ALKPHOS 67  BILITOT 0.5   PT/INR Recent Labs    03/19/17 0121  LABPROT 14.7  INR 1.16   Hepatitis Panel Recent Labs    03/20/17 0627  HCVAB >11.0*   C-Diff Recent Labs    03/20/17 0342  CDIFFTOX NEGATIVE   No results for input(s): CDIFFPCR in the last 72 hours.   Studies/Results: Ct Abdomen Pelvis W Contrast  Result Date: 03/20/2017 CLINICAL DATA:  61 year old female with a history of metastatic lung cancer. Difficulty swallowing and vomiting EXAM: CT ABDOMEN AND PELVIS WITH CONTRAST TECHNIQUE: Multidetector CT imaging of the abdomen and pelvis was performed using the standard protocol following bolus administration of intravenous contrast. CONTRAST:  43mL ISOVUE-300 IOPAMIDOL (ISOVUE-300) INJECTION 61% COMPARISON:  None. FINDINGS: Lower chest: Mass of the medial aspect of the right lower lobe is unchanged from comparison PET-CT 02/12/2017. No acute finding of the lower chest. Unremarkable appearance of the visualized distal thoracic esophagus. Hepatobiliary: 6 mm hypodensity in  the left liver on image 6, indeterminate though unchanged from CT of 11/19/2016. Unremarkable gallbladder. Pancreas: Unremarkable pancreas Spleen: Unremarkable spleen Adrenals/Urinary Tract: Unremarkable appearance of the adrenal glands. Unremarkable appearance the bilateral kidneys with no hydronephrosis or nephrolithiasis. Unremarkable course the bilateral ureters. Unremarkable appearance of the urinary bladder. Stomach/Bowel: Unremarkable appearance of the stomach. Unremarkable small bowel with no abnormal distention. No focal wall thickening. No significant cecal wall thickening or inflammatory changes. Fluid filled colon, particularly of the splenic flexure, descending colon, sigmoid colon and rectum. Appendix is not visualized, however, no inflammatory changes are present adjacent to the cecum to indicate an appendicitis. Vascular/Lymphatic: Scattered calcifications of the abdominal aorta. Bilateral iliac and proximal femoral vasculature patent. Mesenteric vessels and bilateral renal arteries patent. Reproductive: Unremarkable at adnexa and uterus. Other: None Musculoskeletal: No acute displaced fracture. IMPRESSION: Fluid filled colon, potentially representing nonspecific enteritis/colitis. There are no regions of focal segmental colon wall thickening/inflammation to suggest typhlitis. No evidence of bowel obstruction. Mass of the right lower lobe, better characterized on recent prior PET-CT, compatible with known tumor. Aortic Atherosclerosis (ICD10-I70.0). Electronically Signed   By: Corrie Mckusick D.O.   On: 03/20/2017 13:11    Scheduled Inpatient Medications:   . chlorhexidine  15 mL Mouth Rinse BID  . cyclobenzaprine  10 mg Oral QHS  . feeding supplement (ENSURE ENLIVE)  237 mL Oral QID  . mouth rinse  15 mL Mouth Rinse q12n4p  . mometasone-formoterol  2 puff Inhalation BID  . multivitamin  15 mL Oral Daily    Continuous Inpatient Infusions:   . 0.9 % NaCl with KCl 40 mEq / L 125 mL/hr  (03/21/17 1500)  . piperacillin-tazobactam (ZOSYN)  IV Stopped (03/21/17 1456)  . sodium phosphate  Dextrose 5% IVPB 30 mmol (03/21/17 1102)    PRN Inpatient Medications:  acetaminophen **OR** acetaminophen, alum & mag hydroxide-simeth, loperamide, ondansetron **OR** ondansetron (ZOFRAN) IV, sodium chloride    Assessment/Plan   1. Dysphagia - Likely secondary to extrinsic compression from paratracheal and subcarinal lymphadenopathy from lung cancer vs. Intrinsic stenosis. Eating very pureed foods and soft meats per ST service. Has been on clears as a precaution for possible pancreatitis which is not the case. Will advance diet. Await Barium swallow ordered for tomorrow. Possible EGD while patient is here as inpatient.  2. "Pancreatitis" - This is not actually present.  3. Dehydration with syncope -  Patient undergoing rehydration and tolerating clears well.  4. F/E/N - as above. Advance diet. Following.        Teodoro K. Alice Reichert, M.D. 03/21/2017, 3:53 PM

## 2017-03-21 NOTE — Progress Notes (Signed)
Jeanette Jones   DOB:May 29, 1955   DZ#:[id number]    Subjective: Continues to complain of difficulty swallowing regurgitation-currently on clear liquid diet.  She states she is feeling hungry-wants to eat solid food. Diarrhea improved. Overall feels stronger however not back to baseline. She is resting in the bed most of the time. Patient had one temperature of 100.4 yesterday. Objective:  Vitals:   03/21/17 0527 03/21/17 0531  BP:  (!) 95/59  Pulse:  86  Resp:    Temp:    SpO2: 100%      Intake/Output Summary (Last 24 hours) at 03/21/2017 1101 Last data filed at 03/21/2017 0543 Gross per 24 hour  Intake 810 ml  Output -  Net 810 ml    GENERAL: Cachectic appearing female patient Alert, no distress and comfortable.   Alone. EYES: no pallor or icterus OROPHARYNX: no thrush or ulceration. NECK: supple, no masses felt LYMPH:  no palpable lymphadenopathy in the cervical, axillary or inguinal regions LUNGS: decreased breath sounds to auscultation at bases and  No wheeze or crackles HEART/CVS: regular rate & rhythm and no murmurs; No lower extremity edema ABDOMEN: abdomen soft, non-tender and normal bowel sounds Musculoskeletal:no cyanosis of digits and no clubbing  PSYCH: alert & oriented x 3 with fluent speech NEURO: no focal motor/sensory deficits SKIN:  no rashes or significant lesions   Labs:  Lab Results  Component Value Date   WBC 4.9 03/21/2017   HGB 8.2 (L) 03/21/2017   HCT 23.9 (L) 03/21/2017   MCV 89.6 03/21/2017   PLT 266 03/21/2017   NEUTROABS 2.6 03/19/2017    Lab Results  Component Value Date   NA 130 (L) 03/21/2017   K 4.2 03/21/2017   CL 109 03/21/2017   CO2 18 (L) 03/21/2017    Studies:  Ct Abdomen Pelvis W Contrast  Result Date: 03/20/2017 CLINICAL DATA:  61 year old female with a history of metastatic lung cancer. Difficulty swallowing and vomiting EXAM: CT ABDOMEN AND PELVIS WITH CONTRAST TECHNIQUE: Multidetector CT imaging of the abdomen and pelvis  was performed using the standard protocol following bolus administration of intravenous contrast. CONTRAST:  14mL ISOVUE-300 IOPAMIDOL (ISOVUE-300) INJECTION 61% COMPARISON:  None. FINDINGS: Lower chest: Mass of the medial aspect of the right lower lobe is unchanged from comparison PET-CT 02/12/2017. No acute finding of the lower chest. Unremarkable appearance of the visualized distal thoracic esophagus. Hepatobiliary: 6 mm hypodensity in the left liver on image 6, indeterminate though unchanged from CT of 11/19/2016. Unremarkable gallbladder. Pancreas: Unremarkable pancreas Spleen: Unremarkable spleen Adrenals/Urinary Tract: Unremarkable appearance of the adrenal glands. Unremarkable appearance the bilateral kidneys with no hydronephrosis or nephrolithiasis. Unremarkable course the bilateral ureters. Unremarkable appearance of the urinary bladder. Stomach/Bowel: Unremarkable appearance of the stomach. Unremarkable small bowel with no abnormal distention. No focal wall thickening. No significant cecal wall thickening or inflammatory changes. Fluid filled colon, particularly of the splenic flexure, descending colon, sigmoid colon and rectum. Appendix is not visualized, however, no inflammatory changes are present adjacent to the cecum to indicate an appendicitis. Vascular/Lymphatic: Scattered calcifications of the abdominal aorta. Bilateral iliac and proximal femoral vasculature patent. Mesenteric vessels and bilateral renal arteries patent. Reproductive: Unremarkable at adnexa and uterus. Other: None Musculoskeletal: No acute displaced fracture. IMPRESSION: Fluid filled colon, potentially representing nonspecific enteritis/colitis. There are no regions of focal segmental colon wall thickening/inflammation to suggest typhlitis. No evidence of bowel obstruction. Mass of the right lower lobe, better characterized on recent prior PET-CT, compatible with known tumor. Aortic Atherosclerosis (ICD10-I70.0). Electronically  Signed   By: Corrie Mckusick D.O.   On: 03/20/2017 13:11    Assessment & Plan:  61 year old female patient with metastatic lung cancer currently on single agent Abraxane is admitted to the hospital for-presumed sepsis  # Metastatic squamous cell lung cancer status post multiple lines of therapy- most recently on Abraxane single agent currently status post cycle #1. Patient seems to be tolerating chemotherapy poorly- significant diarrhea/nausea vomiting- acute renal failure/severe electrolyte abnormalities. Recommend holding further chemotherapy at this time. Will consider dose reduction or alternative chemotherapy next time.  # Difficulty swallowing/abdominal pain elevated lipase- question stricture versus others. CT scan no obvious evidence of pancreatitis; possible colitis.  s/p GI evaluation; awaiting further work up/recommendations.  # severe electrolyte abnormalities/hypotension-hypophosphatemia-supplement as per primary team   # UTI-on culture on antibiotics. Discussed with Dr. Margaretmary Eddy.   Cammie Sickle, MD 03/21/2017  11:01 AM

## 2017-03-21 NOTE — Consult Note (Signed)
MEDICATION RELATED CONSULT NOTE - INITIAL   Pharmacy Consult for electrolytes Indication: low phosphours  Allergies  Allergen Reactions  . Carboplatin Anaphylaxis    Patient Measurements: Height: 5\' 4"  (162.6 cm) Weight: 116 lb 3.2 oz (52.7 kg) IBW/kg (Calculated) : 54.7 Adjusted Body Weight:   Vital Signs: Temp: 100.7 F (38.2 C) (11/04 0526) Temp Source: Oral (11/04 0526) BP: 95/59 (11/04 0531) Pulse Rate: 86 (11/04 0531) Intake/Output from previous day: 11/03 0701 - 11/04 0700 In: 810 [P.O.:760; IV Piggyback:50] Out: -  Intake/Output from this shift: No intake/output data recorded.  Labs: Recent Labs    03/19/17 0040 03/19/17 0041 03/20/17 0627 03/21/17 0456  WBC  --  4.6 4.0 4.9  HGB  --  11.5* 8.6* 8.2*  HCT  --  33.0* 25.5* 23.9*  PLT  --  326 267 266  CREATININE 2.22*  --  0.83 0.60  MG 2.3  --  2.0 1.8  PHOS  --   --  1.3* <1.0*  ALBUMIN 3.4*  --  2.5*  --   PROT 9.5*  --  6.9  --   AST 80*  --  71*  --   ALT 38  --  32  --   ALKPHOS 67  --  67  --   BILITOT 0.6  --  0.5  --    Estimated Creatinine Clearance: 63 mL/min (by C-G formula based on SCr of 0.6 mg/dL).   Microbiology: Recent Results (from the past 720 hour(s))  Blood culture (routine x 2)     Status: None (Preliminary result)   Collection Time: 03/19/17 12:41 AM  Result Value Ref Range Status   Specimen Description BLOOD RIGHT ANTECUBITAL  Final   Special Requests   Final    BOTTLES DRAWN AEROBIC AND ANAEROBIC Blood Culture adequate volume   Culture NO GROWTH 2 DAYS  Final   Report Status PENDING  Incomplete  Urine culture     Status: Abnormal (Preliminary result)   Collection Time: 03/19/17 12:41 AM  Result Value Ref Range Status   Specimen Description URINE, RANDOM  Final   Special Requests NONE  Final   Culture (A)  Final    >=100,000 COLONIES/mL ESCHERICHIA COLI CULTURE REINCUBATED FOR BETTER GROWTH Performed at [facility], [place] [address] [place], [place]  [address]    Report Status PENDING  Incomplete   Organism ID, Bacteria ESCHERICHIA COLI (A)  Final      Susceptibility   Escherichia coli - MIC*    AMPICILLIN <=2 SENSITIVE Sensitive     CEFAZOLIN <=4 SENSITIVE Sensitive     CEFTRIAXONE <=1 SENSITIVE Sensitive     CIPROFLOXACIN <=0.25 SENSITIVE Sensitive     GENTAMICIN <=1 SENSITIVE Sensitive     IMIPENEM <=0.25 SENSITIVE Sensitive     NITROFURANTOIN <=16 SENSITIVE Sensitive     TRIMETH/SULFA <=20 SENSITIVE Sensitive     AMPICILLIN/SULBACTAM <=2 SENSITIVE Sensitive     PIP/TAZO <=4 SENSITIVE Sensitive     Extended ESBL NEGATIVE Sensitive     * >=100,000 COLONIES/mL ESCHERICHIA COLI  Blood culture (routine x 2)     Status: None (Preliminary result)   Collection Time: 03/19/17 12:42 AM  Result Value Ref Range Status   Specimen Description BLOOD LEFT ANTECUBITAL  Final   Special Requests   Final    BOTTLES DRAWN AEROBIC AND ANAEROBIC Blood Culture adequate volume   Culture NO GROWTH 2 DAYS  Final   Report Status PENDING  Incomplete  C difficile quick  scan w PCR reflex     Status: None   Collection Time: 03/20/17  3:42 AM  Result Value Ref Range Status   C Diff antigen NEGATIVE NEGATIVE Final   C Diff toxin NEGATIVE NEGATIVE Final   C Diff interpretation No C. difficile detected.  Final  Gastrointestinal Panel by PCR , Stool     Status: None   Collection Time: 03/20/17  3:42 AM  Result Value Ref Range Status   Campylobacter species NOT DETECTED NOT DETECTED Final   Plesimonas shigelloides NOT DETECTED NOT DETECTED Final   Salmonella species NOT DETECTED NOT DETECTED Final   Yersinia enterocolitica NOT DETECTED NOT DETECTED Final   Vibrio species NOT DETECTED NOT DETECTED Final   Vibrio cholerae NOT DETECTED NOT DETECTED Final   Enteroaggregative E coli (EAEC) NOT DETECTED NOT DETECTED Final   Enteropathogenic E coli (EPEC) NOT DETECTED NOT DETECTED Final   Enterotoxigenic E coli (ETEC) NOT DETECTED NOT DETECTED Final   Shiga like  toxin producing E coli (STEC) NOT DETECTED NOT DETECTED Final   Shigella/Enteroinvasive E coli (EIEC) NOT DETECTED NOT DETECTED Final   Cryptosporidium NOT DETECTED NOT DETECTED Final   Cyclospora cayetanensis NOT DETECTED NOT DETECTED Final   Entamoeba histolytica NOT DETECTED NOT DETECTED Final   Giardia lamblia NOT DETECTED NOT DETECTED Final   Adenovirus F40/41 NOT DETECTED NOT DETECTED Final   Astrovirus NOT DETECTED NOT DETECTED Final   Norovirus GI/GII NOT DETECTED NOT DETECTED Final   Rotavirus A NOT DETECTED NOT DETECTED Final   Sapovirus (I, II, IV, and V) NOT DETECTED NOT DETECTED Final    Medical History: Past Medical History:  Diagnosis Date  . Back pain, chronic   . Carcinoma of lung ([place]) 07/2011  . Cough   . Depression   . Dyspnea   . Emphysema of lung ([place])   . H/O tubal ligation 02/12/2017   From chart  . Heart murmur   . Hep C w/o coma, chronic ([place])   . Hypertension   . Knee pain 02/12/2017  . Lung cancer ([place])   . Shoulder pain 02/09/2017  . Swallowing difficulty     Medications:  Scheduled:  . chlorhexidine  15 mL Mouth Rinse BID  . cyclobenzaprine  10 mg Oral QHS  . feeding supplement (ENSURE ENLIVE)  237 mL Oral QID  . mouth rinse  15 mL Mouth Rinse q12n4p  . mometasone-formoterol  2 puff Inhalation BID  . multivitamin  15 mL Oral Daily    Assessment: Patient is a 61 year old female with dysphagia, dehydration, abdominal pain, diarrhea. Pt had low K and Mg on admission which since has been replaced. Patient Phos found to be <1 this AM. K=4.2 Mg=1.8 Phos= <1  Goal of Therapy:  K=3.5-5 Mg=1.8-24 Phos=2.5-4.6  Plan:  Patient currently has NS w/ 40 KCL running at 199ml/hr (5 MEQ/hr), therefore I will give sodium phosphate 30 mmol x 1 and recheck 1 hr after infusion.  Ramond Dial, Pharm.D, BCPS Clinical Pharmacist  03/21/2017,8:51 AM

## 2017-03-22 ENCOUNTER — Inpatient Hospital Stay: Payer: Medicare Other | Admitting: Internal Medicine

## 2017-03-22 ENCOUNTER — Inpatient Hospital Stay: Payer: Medicare Other

## 2017-03-22 ENCOUNTER — Other Ambulatory Visit: Payer: Self-pay

## 2017-03-22 DIAGNOSIS — R590 Localized enlarged lymph nodes: Secondary | ICD-10-CM

## 2017-03-22 LAB — BASIC METABOLIC PANEL
Anion gap: 4 — ABNORMAL LOW (ref 5–15)
CHLORIDE: 107 mmol/L (ref 101–111)
CO2: 19 mmol/L — ABNORMAL LOW (ref 22–32)
Calcium: 7.6 mg/dL — ABNORMAL LOW (ref 8.9–10.3)
Creatinine, Ser: 0.58 mg/dL (ref 0.44–1.00)
GFR calc Af Amer: >60 mL/min (ref >60)
GFR calc non Af Amer: >60 mL/min (ref >60)
Glucose, Bld: 102 mg/dL — ABNORMAL HIGH (ref 65–99)
Potassium: 4.5 mmol/L (ref 3.5–5.1)
SODIUM: 130 mmol/L — AB (ref 135–145)

## 2017-03-22 LAB — MAGNESIUM
Magnesium: 1.2 mg/dL — ABNORMAL LOW (ref 1.7–2.4)
Magnesium: 2 mg/dL (ref 1.7–2.4)

## 2017-03-22 LAB — CBC
HCT: 25.1 % — ABNORMAL LOW (ref 35.0–47.0)
HEMOGLOBIN: 8.4 g/dL — AB (ref 12.0–16.0)
MCH: 30.2 pg (ref 26.0–34.0)
MCHC: 33.6 g/dL (ref 32.0–36.0)
MCV: 89.7 fL (ref 80.0–100.0)
Platelets: 273 10*3/uL (ref 150–440)
RBC: 2.8 MIL/uL — ABNORMAL LOW (ref 3.80–5.20)
RDW: 17.4 % — AB (ref 11.5–14.5)
WBC: 4.4 10*3/uL (ref 3.6–11.0)

## 2017-03-22 LAB — PHOSPHORUS: PHOSPHORUS: 1.6 mg/dL — AB (ref 2.5–4.6)

## 2017-03-22 LAB — OCCULT BLOOD X 1 CARD TO LAB, STOOL: FECAL OCCULT BLD: NEGATIVE

## 2017-03-22 LAB — LIPASE, BLOOD: LIPASE: 121 U/L — AB (ref 11–51)

## 2017-03-22 MED ORDER — DEXTROSE 5 % IV SOLN
Freq: Three times a day (TID) | INTRAVENOUS | Status: DC
  Administered 2017-03-22 – 2017-03-23 (×3): via INTRAVENOUS
  Filled 2017-03-22 (×5): qty 10

## 2017-03-22 MED ORDER — CEFAZOLIN SODIUM-DEXTROSE 1-4 GM/50ML-% IV SOLN: 1.0000 g | Freq: Three times a day (TID) | INTRAVENOUS | Status: DC

## 2017-03-22 MED ORDER — MAGNESIUM SULFATE 4 GM/100ML IV SOLN
4.0000 g | Freq: Once | INTRAVENOUS | Status: AC
  Administered 2017-03-22: 08:00:00 4 g via INTRAVENOUS
  Filled 2017-03-22: qty 100

## 2017-03-22 MED ORDER — K PHOS MONO-SOD PHOS DI & MONO 155-852-130 MG PO TABS
500.0000 mg | ORAL_TABLET | ORAL | Status: DC
  Filled 2017-03-22 (×2): qty 2

## 2017-03-22 MED ORDER — SODIUM PHOSPHATES 45 MMOLE/15ML IV SOLN
10.0000 mmol | Freq: Once | INTRAVENOUS | Status: AC
  Administered 2017-03-22: 10 mmol via INTRAVENOUS
  Filled 2017-03-22: qty 3.33

## 2017-03-22 MED ORDER — SODIUM CHLORIDE 0.9 % IV SOLN: INTRAVENOUS | Status: DC

## 2017-03-22 NOTE — Progress Notes (Signed)
Pharmacy Antibiotic Note  Jeanette Jones is a 61 y.o. female admitted on 03/19/2017 with post-obstructive PNA and UTI. Pharmacy has been consulted for piperacillin/tazobactam dosing.  This is day #4 of antibiotics.  Plan: Continue piperacillin/tazobactam 3.375 g IV q8h EI  Height: 5\' 4"  (162.6 cm) Weight: 112 lb 9.6 oz (51.1 kg) IBW/kg (Calculated) : 54.7  Temp (24hrs), Avg:98.8 F (37.1 C), Min:97.6 F (36.4 C), Max:99.7 F (37.6 C)  Recent Labs  Lab 03/19/17 0040 03/19/17 0041 03/19/17 0349 03/20/17 0627 03/21/17 0456 03/22/17 0443  WBC  --  4.6  --  4.0 4.9 4.4  CREATININE 2.22*  --   --  0.83 0.60 0.58  LATICACIDVEN  --  2.5* 0.7  --   --   --     Estimated Creatinine Clearance: 61.1 mL/min (by C-G formula based on SCr of 0.58 mg/dL).    Allergies  Allergen Reactions  . Carboplatin Anaphylaxis    Antimicrobials this admission: Vancomycin 11/2  Piperacillin/tazobactam 11/2  >>   Microbiology results: 11/2 BCx: No growth 3 days 11/2 UCx: >100K E.coli (pan-sensitive) and lactobacillus  Thank you for allowing pharmacy to be a part of this patient's care.  Lenis Noon, PharmD, BCPS Clinical Pharmacist 03/22/2017 9:54 AM

## 2017-03-22 NOTE — Progress Notes (Signed)
Jeanette Jones   DOB:1955-08-19   QQ#:[id number]    Subjective: Continues to complain of difficulty swallowing regurgitation-currently on clear liquid diet. She is currently nothing by mouth since last night . States she is hungry. She wants to advance her diet. She just had a big and swallow test and this morning.   No fevers or chills.  She feels little stronger.  Objective:  Vitals:   03/22/17 0900 03/22/17 1353  BP:  109/66  Pulse:  70  Resp: 19 18  Temp:  97.9 F (36.6 C)  SpO2:  99%     Intake/Output Summary (Last 24 hours) at 03/22/2017 1824 Last data filed at 03/22/2017 1521 Gross per 24 hour  Intake 7401.25 ml  Output -  Net 7401.25 ml    GENERAL: Cachectic appearing female patient Alert, no distress and comfortable.   Alone. EYES: no pallor or icterus OROPHARYNX: no thrush or ulceration. NECK: supple, no masses felt LYMPH:  no palpable lymphadenopathy in the cervical, axillary or inguinal regions LUNGS: decreased breath sounds to auscultation at bases and  No wheeze or crackles HEART/CVS: regular rate & rhythm and no murmurs; No lower extremity edema ABDOMEN: abdomen soft, non-tender and normal bowel sounds Musculoskeletal:no cyanosis of digits and no clubbing  PSYCH: alert & oriented x 3 with fluent speech NEURO: no focal motor/sensory deficits SKIN:  no rashes or significant lesions   Labs:  Lab Results  Component Value Date   WBC 4.4 03/22/2017   HGB 8.4 (L) 03/22/2017   HCT 25.1 (L) 03/22/2017   MCV 89.7 03/22/2017   PLT 273 03/22/2017   NEUTROABS 2.6 03/19/2017    Lab Results  Component Value Date   NA 130 (L) 03/22/2017   K 4.5 03/22/2017   CL 107 03/22/2017   CO2 19 (L) 03/22/2017    Studies:  Dg Esophagus  Result Date: 03/22/2017 CLINICAL DATA:  Chronic dysphagia, increasing severity over the past few days especially solid foods and pills. Patient underwent esophageal dilation 3 years ago with some improvement. The patient is very weak and is  currently receiving chemotherapy for lung malignancy. EXAM: ESOPHOGRAM/BARIUM SWALLOW TECHNIQUE: Single contrast examination was performed using thin barium or water soluble. A double contrast study a could not be performed due to the patient's profound weakness. FLUOROSCOPY TIME:  Fluoroscopy Time:  0 minutes, 30 seconds Radiation Exposure Index (if provided by the fluoroscopic device): 723 micro Gy per meters square Number of Acquired Spot Images: 1+ 1 video loop. COMPARISON:  Barium esophagram of January 28, 2016 FINDINGS: Hypopharynx distended well. There was no laryngeal penetration of the barium. The cervical esophagus distended reasonably well. 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 Distal to this the mid and lower esophagus distended well. A tiny reducible hiatal hernia is suspected. The patient was unable to mobilize the barium tablet to swallow it. IMPRESSION: There is an approximately 4 cm segment of moderate narrowing of the proximal thoracic esophagus similar to that seen on the previous study. Direct visualization with consideration for esophageal dilation would be useful when the patient can tolerate the procedure. Electronically Signed   By: David  Martinique M.D.   On: 03/22/2017 08:17    Assessment & Plan:  61 year old female patient with metastatic lung cancer currently on single agent Abraxane is admitted to the hospital for-presumed sepsis   # Difficulty swallowing/abdominal pain elevated lipase- question stricture versus others. CT scan no obvious evidence of  pancreatitis; possible colitis.  Awaiting barium swallow study done this AM. Awaiting ? EGD in hospital.   # Metastatic squamous cell lung cancer status post multiple lines of therapy- most recently on Abraxane single agent currently status post cycle #1. Patient seems to be tolerating chemotherapy poorly- significant diarrhea/nausea vomiting- acute  renal failure/severe electrolyte abnormalities. Recommend holding further chemotherapy at this time. Will consider dose reduction or alternative chemotherapy next time.  # severe electrolyte abnormalities/hypotension-hypophosphatemia-supplement as per primary team   # UTI-on culture on antibiotics. Discussed with Dr. Margaretmary Eddy.   Cammie Sickle, MD 03/22/2017  6:24 PM

## 2017-03-22 NOTE — Consult Note (Addendum)
MEDICATION RELATED CONSULT NOTE - INITIAL   Pharmacy Consult for electrolytes Indication: low phosphorus  Allergies  Allergen Reactions  . Carboplatin Anaphylaxis   Patient Measurements: Height: 5\' 4"  (162.6 cm) Weight: 112 lb 9.6 oz (51.1 kg) IBW/kg (Calculated) : 54.7    Vital Signs: Temp: 98.7 F (37.1 C) (11/05 0853) Temp Source: Oral (11/05 0853) BP: 114/70 (11/05 0853) Pulse Rate: 72 (11/05 0853)  Labs: Recent Labs    03/20/17 0627 03/21/17 0456 03/21/17 1720 03/22/17 0443  WBC 4.0 4.9  --  4.4  HGB 8.6* 8.2*  --  8.4*  HCT 25.5* 23.9*  --  25.1*  PLT 267 266  --  273  CREATININE 0.83 0.60  --  0.58  MG 2.0 1.8  --  1.2*  PHOS 1.3* <1.0* 2.7 1.6*  ALBUMIN 2.5*  --   --   --   PROT 6.9  --   --   --   AST 71*  --   --   --   ALT 32  --   --   --   ALKPHOS 67  --   --   --   BILITOT 0.5  --   --   --    Estimated Creatinine Clearance: 61.1 mL/min (by C-G formula based on SCr of 0.58 mg/dL).  Assessment: Patient is a 61 year old female with dysphagia, dehydration, abdominal pain, and diarrhea. Patient was hypokalemic and hypophosphatemic on admission.  Patient is currently NPO.  Goal of Therapy: Electrolytes WNL K=3.5-5 Mg=1.8-24 Phos=2.5-4.6  Plan:  K = 4.5 is WNL on NS w/40 mEq KCl running at 125 mL/hr.  Mg = 1.2, Phos = 1.6 are both low  Will ask MD about decreasing rate of maintenance fluids with K.  Will order magnesium 4 g IV once and sodium phosphate 10 mmol IV once per protocol. Will recheck electrolytes with AM labs tomorrow.  Lenis Noon, Pharm.D, BCPS Clinical Pharmacist 03/22/2017,9:57 AM  Addendum:  Decreasing rate of maintenance fluids with KCl to 75 mL/hr per MD.  Lenis Noon, PharmD 03/22/17 2:58 PM

## 2017-03-22 NOTE — Progress Notes (Addendum)
[place] [facility] [place] NAME: Jeanette Jones    MR#:  [id number]  DATE OF BIRTH:  18-Sep-1955  SUBJECTIVE:  CHIEF COMPLAINT:   Chief Complaint  Patient presents with  . Loss of Consciousness    Metastatic cancer, have difficulty swallowing food and meds and having nausea. Came with severe dehydration and suspected septic. Patient is afebrile today, patient had a barium swallow today hungry  REVIEW OF SYSTEMS:  CONSTITUTIONAL: No fever,positive for fatigue or weakness.  EYES: No blurred or double vision.  EARS, NOSE, AND THROAT: No tinnitus or ear pain.  Reporting difficulty with swallowing or RESPIRATORY: No cough, shortness of breath, wheezing or hemoptysis.  CARDIOVASCULAR: No chest pain, orthopnea, edema.  GASTROINTESTINAL: No nausea, vomiting, diarrhea or abdominal pain.  GENITOURINARY: No dysuria, hematuria.  ENDOCRINE: No polyuria, nocturia,  HEMATOLOGY: No anemia, easy bruising or bleeding SKIN: No rash or lesion. MUSCULOSKELETAL: No joint pain or arthritis.   NEUROLOGIC: No tingling, numbness, weakness.  PSYCHIATRY: No anxiety or depression.   ROS  DRUG ALLERGIES:   Allergies  Allergen Reactions  . Carboplatin Anaphylaxis    VITALS:  Blood pressure 109/66, pulse 70, temperature 97.9 F (36.6 C), temperature source Oral, resp. rate 18, height 5\' 4"  (1.626 m), weight 51.1 kg (112 lb 9.6 oz), SpO2 99 %.  PHYSICAL EXAMINATION:  GENERAL:  61 y.o.-year-old thin patient lying in the bed with no acute distress.  EYES: Pupils equal, round, reactive to light and accommodation. No scleral icterus. Extraocular muscles intact.  HEENT: Head atraumatic, normocephalic. Oropharynx and nasopharynx clear.  NECK:  Supple, no jugular venous distention. No thyroid enlargement, no tenderness.  LUNGS: Normal breath sounds bilaterally, no wheezing, rales,rhonchi or crepitation. No use of accessory muscles of respiration.  CARDIOVASCULAR: S1, S2  normal. No murmurs, rubs, or gallops.  ABDOMEN: Soft, nontender, nondistended. Bowel sounds present. No organomegaly or mass.  EXTREMITIES: No pedal edema, cyanosis, or clubbing.  NEUROLOGIC: Cranial nerves II through XII are intact. Muscle strength 4/5 in all extremities. Sensation intact. Gait not checked.  PSYCHIATRIC: The patient is alert and oriented x 3.  SKIN: No obvious rash, lesion, or ulcer.   Physical Exam LABORATORY PANEL:   CBC Recent Labs  Lab 03/22/17 0443  WBC 4.4  HGB 8.4*  HCT 25.1*  PLT 273   ------------------------------------------------------------------------------------------------------------------  Chemistries  Recent Labs  Lab 03/20/17 0627  03/22/17 0443 03/22/17 1438  NA 132*   < > 130*  --   K 3.4*   < > 4.5  --   CL 109   < > 107  --   CO2 17*   < > 19*  --   GLUCOSE 118*   < > 102*  --   BUN 30*   < > <5*  --   CREATININE 0.83   < > 0.58  --   CALCIUM 8.1*   < > 7.6*  --   MG 2.0   < > 1.2* 2.0  AST 71*  --   --   --   ALT 32  --   --   --   ALKPHOS 67  --   --   --   BILITOT 0.5  --   --   --    < > = values in this interval not displayed.   ------------------------------------------------------------------------------------------------------------------  Cardiac Enzymes Recent Labs  Lab 03/19/17 0041  TROPONINI <0.03   ------------------------------------------------------------------------------------------------------------------  RADIOLOGY:  Dg Esophagus  Result Date: 03/22/2017  CLINICAL DATA:  Chronic dysphagia, increasing severity over the past few days especially solid foods and pills. Patient underwent esophageal dilation 3 years ago with some improvement. The patient is very weak and is currently receiving chemotherapy for lung malignancy. EXAM: ESOPHOGRAM/BARIUM SWALLOW TECHNIQUE: Single contrast examination was performed using thin barium or water soluble. A double contrast study a could not be performed due to the  patient's profound weakness. FLUOROSCOPY TIME:  Fluoroscopy Time:  0 minutes, 30 seconds Radiation Exposure Index (if provided by the fluoroscopic device): 723 micro Gy per meters square Number of Acquired Spot Images: 1+ 1 video loop. COMPARISON:  Barium esophagram of January 28, 2016 FINDINGS: Hypopharynx distended well. There was no laryngeal penetration of the barium. The cervical esophagus distended reasonably well. 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 Distal to this the mid and lower esophagus distended well. A tiny reducible hiatal hernia is suspected. The patient was unable to mobilize the barium tablet to swallow it. IMPRESSION: There is an approximately 4 cm segment of moderate narrowing of the proximal thoracic esophagus similar to that seen on the previous study. Direct visualization with consideration for esophageal dilation would be useful when the patient can tolerate the procedure. Electronically Signed   By: David  Martinique M.D.   On: 03/22/2017 08:17    ASSESSMENT AND PLAN:   Active Problems:   Sepsis (New Site)   * sepsis secondary to UTI and possible postobstructive pneumonia    Ur cx have Gram neg rods E. coli which is pansensitive Blood cultures are negative so far Patient is on  IV Zosyn and will switch to narrow spectrum antibiotic cefazolin  Supportive treatment with IV fluids  *Acute renal failure secondary to dehydration Clinically improving with IV fluids.  * hypotension   IV fluid bolus given.   Have orthostatic  hypotension, cont IV fluids.  *Metastatic squamous cell lung cancer status post multiple lines of therapy  lung cancer with right upper lobe mass possible postobstructive pneumonia Oncology Dr. Seward Meth is following recommending to, hold further chemotherapy as patient is tolerating chemo poorly    * Dysphagia   Likely from extrinsic compression from the paratracheal and  some cranial lymphadenopathy from lung cancer    SLP eval. patient is tolerating-pured diet and soft meats    In Sept 2017- had esophageal stricture, Dilation done barium swallow in a.m. has revealed narrowing of thoracic part of the esophagus GI is recommending a mechanical soft diet.  EGD with esophageal dilatation if not tolerating well  *Hyponatremia and hypomagnesemia replete and recheck in a.m.  * acute pancreatitis with elevated lipase and nausea Tolerating pured diet Patient was seen and evaluated by gastroenterology,  does not think  that the patient has pancreatitis Repeat lipase in a.m. CT abdomen revealed fluid-filled colon, no bowel obstruction, right lower lobe mass, recommending PET scan for better characterization-outpatient follow-up with oncology after discharge  * Hypokalemia Replete and recheck potassium and magnesium levels in a.m.  *Dark colored stool?  Melena Negative stool for Hemoccult follow-up with GI,  All the records are reviewed and case discussed with Care Management/Social Workerr. Management plans discussed with the patient, family and they are in agreement.  CODE STATUS: full.  TOTAL TIME TAKING CARE OF THIS PATIENT: 45 minutes.  Spoke to her daughter in room.  POSSIBLE D/C IN 2-3 DAYS, DEPENDING ON CLINICAL CONDITION.   Nicholes Mango M.D on 03/22/2017   Between 7am to  6pm - Pager - [phone]  After 6pm go to [email or web address] - password [email or web address] [facility]  Office  [phone]  CC: Primary care physician; [facility], [place]  Note: This dictation was prepared with Diplomatic Services operational officer dictation along with smaller phrase technology. Any transcriptional errors that result from this process are unintentional.

## 2017-03-22 NOTE — Progress Notes (Signed)
Subjective: Patient seen for f/u dysphagia in setting of locally advanced lung cancer. Patient s/p barium swallow this Am showing persistent 4cm long, smooth stricture in thoracic esophagus. Patient would like to eat soft diet, which has been tolerated in the past. No pain today.  Objective: Vital signs in last 24 hours: Temp:  [97.9 F (36.6 C)-99.7 F (37.6 C)] 97.9 F (36.6 C) (11/05 1353) Pulse Rate:  [70-83] 70 (11/05 1353) Resp:  [18-26] 18 (11/05 1353) BP: (106-114)/(64-70) 109/66 (11/05 1353) SpO2:  [93 %-100 %] 99 % (11/05 1353) Weight:  [51.1 kg (112 lb 9.6 oz)] 51.1 kg (112 lb 9.6 oz) (11/05 0450) Blood pressure 109/66, pulse 70, temperature 97.9 F (36.6 C), temperature source Oral, resp. rate 18, height 5\' 4"  (1.626 m), weight 51.1 kg (112 lb 9.6 oz), SpO2 99 %.   Intake/Output from previous day: 11/04 0701 - 11/05 0700 In: 6534.2 [P.O.:480; I.V.:6004.2; IV Piggyback:50] Out: -   Intake/Output this shift: Total I/O In: 500 [I.V.:500] Out: -    General appearance:  Alert, thin, NAD Resp:  CTA Cardio:  RR nol S1, S2 GI:  Abd soft, nt, nd. BS+ Extremities:  +Muscle atrophy in all 4 quadrants.   Lab Results: Results for orders placed or performed during the hospital encounter of 03/19/17 (from the past 24 hour(s))  Phosphorus     Status: None   Collection Time: 03/21/17  5:20 PM  Result Value Ref Range   Phosphorus 2.7 2.5 - 4.6 mg/dL  Occult blood card to lab, stool RN will collect     Status: None   Collection Time: 03/22/17 12:01 AM  Result Value Ref Range   Fecal Occult Bld NEGATIVE NEGATIVE  Phosphorus     Status: Abnormal   Collection Time: 03/22/17  4:43 AM  Result Value Ref Range   Phosphorus 1.6 (L) 2.5 - 4.6 mg/dL  Magnesium     Status: Abnormal   Collection Time: 03/22/17  4:43 AM  Result Value Ref Range   Magnesium 1.2 (L) 1.7 - 2.4 mg/dL  CBC     Status: Abnormal   Collection Time: 03/22/17  4:43 AM  Result Value Ref Range   WBC 4.4 3.6 -  11.0 K/uL   RBC 2.80 (L) 3.80 - 5.20 MIL/uL   Hemoglobin 8.4 (L) 12.0 - 16.0 g/dL   HCT 25.1 (L) 35.0 - 47.0 %   MCV 89.7 80.0 - 100.0 fL   MCH 30.2 26.0 - 34.0 pg   MCHC 33.6 32.0 - 36.0 g/dL   RDW 17.4 (H) 11.5 - 14.5 %   Platelets 273 150 - 440 K/uL  Basic metabolic panel     Status: Abnormal   Collection Time: 03/22/17  4:43 AM  Result Value Ref Range   Sodium 130 (L) 135 - 145 mmol/L   Potassium 4.5 3.5 - 5.1 mmol/L   Chloride 107 101 - 111 mmol/L   CO2 19 (L) 22 - 32 mmol/L   Glucose, Bld 102 (H) 65 - 99 mg/dL   BUN <5 (L) 6 - 20 mg/dL   Creatinine, Ser 0.58 0.44 - 1.00 mg/dL   Calcium 7.6 (L) 8.9 - 10.3 mg/dL   GFR calc non Af Amer >60 >60 mL/min   GFR calc Af Amer >60 >60 mL/min   Anion gap 4 (L) 5 - 15  Lipase, blood     Status: Abnormal   Collection Time: 03/22/17  4:43 AM  Result Value Ref Range   Lipase 121 (H) 11 -  51 U/L     Recent Labs    03/20/17 0627 03/21/17 0456 03/22/17 0443  WBC 4.0 4.9 4.4  HGB 8.6* 8.2* 8.4*  HCT 25.5* 23.9* 25.1*  PLT 267 266 273   BMET Recent Labs    03/20/17 0627 03/21/17 0456 03/22/17 0443  NA 132* 130* 130*  K 3.4* 4.2 4.5  CL 109 109 107  CO2 17* 18* 19*  GLUCOSE 118* 116* 102*  BUN 30* 8 <5*  CREATININE 0.83 0.60 0.58  CALCIUM 8.1* 7.7* 7.6*   LFT Recent Labs    03/20/17 0627  PROT 6.9  ALBUMIN 2.5*  AST 71*  ALT 32  ALKPHOS 67  BILITOT 0.5   PT/INR No results for input(s): LABPROT, INR in the last 72 hours. Hepatitis Panel Recent Labs    03/20/17 0627  HCVAB >11.0*   C-Diff Recent Labs    03/20/17 0342  CDIFFTOX NEGATIVE   No results for input(s): CDIFFPCR in the last 72 hours.   Studies/Results: Dg Esophagus  Result Date: 03/22/2017 CLINICAL DATA:  Chronic dysphagia, increasing severity over the past few days especially solid foods and pills. Patient underwent esophageal dilation 3 years ago with some improvement. The patient is very weak and is currently receiving chemotherapy for  lung malignancy. EXAM: ESOPHOGRAM/BARIUM SWALLOW TECHNIQUE: Single contrast examination was performed using thin barium or water soluble. A double contrast study a could not be performed due to the patient's profound weakness. FLUOROSCOPY TIME:  Fluoroscopy Time:  0 minutes, 30 seconds Radiation Exposure Index (if provided by the fluoroscopic device): 723 micro Gy per meters square Number of Acquired Spot Images: 1+ 1 video loop. COMPARISON:  Barium esophagram of January 28, 2016 FINDINGS: Hypopharynx distended well. There was no laryngeal penetration of the barium. The cervical esophagus distended reasonably well. 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 Distal to this the mid and lower esophagus distended well. A tiny reducible hiatal hernia is suspected. The patient was unable to mobilize the barium tablet to swallow it. IMPRESSION: There is an approximately 4 cm segment of moderate narrowing of the proximal thoracic esophagus similar to that seen on the previous study. Direct visualization with consideration for esophageal dilation would be useful when the patient can tolerate the procedure. Electronically Signed   By: David  Martinique M.D.   On: 03/22/2017 08:17    Scheduled Inpatient Medications:   . chlorhexidine  15 mL Mouth Rinse BID  . cyclobenzaprine  10 mg Oral QHS  . feeding supplement (ENSURE ENLIVE)  237 mL Oral QID  . mouth rinse  15 mL Mouth Rinse q12n4p  . mometasone-formoterol  2 puff Inhalation BID  . multivitamin  15 mL Oral Daily    Continuous Inpatient Infusions:   . 0.9 % NaCl with KCl 40 mEq / L 125 mL/hr (03/22/17 0630)  . piperacillin-tazobactam (ZOSYN)  IV Stopped (03/22/17 1451)  . sodium phosphate  Dextrose 5% IVPB 10 mmol (03/22/17 1040)    PRN Inpatient Medications:  acetaminophen **OR** acetaminophen, alum & mag hydroxide-simeth, loperamide, ondansetron **OR** ondansetron (ZOFRAN) IV,  sodium chloride  Miscellaneous:   Assessment:  1. Dysphagia - Secondary to esophageal extrinsic compression causing stricturein  2. Lung cancer.  Plan:  1. Mech soft diet. EGD with esophageal dilation if not tolerated well. Following.   Elizabeth Haff K. Alice Reichert, M.D. 03/22/2017, 3:06 PM

## 2017-03-22 NOTE — Progress Notes (Addendum)
SLP Cancellation Note  Patient Details Name: Jeanette Jones MRN: [id number] DOB: 07/22/1955   Cancelled treatment:       Reason Eval/Treat Not Completed: Medical issues which prohibited therapy(Pt NPO d/t Gi procedure/test; ST services will f/u tomorrow)  Carolynn Sayers, SLP-Graduate Student Carolynn Sayers 03/22/2017, 12:30 PM   This information has been reviewed and agreed upon by this supervising clinician.  This patient note, response to treatment and overall treatment plan has been reviewed and this clinician agrees with the information provided.  03/22/17, 1:00 PM [place], [place], CCC-SLP

## 2017-03-22 NOTE — Care Management Important Message (Signed)
Important Message  Patient Details  Name: Jeanette Jones MRN: [id number] Date of Birth: [birth year]   Medicare Important Message Given:  Yes    Marshell Garfinkel, RN 03/22/2017, 9:37 AM

## 2017-03-22 NOTE — Care Management Note (Signed)
Case Management Note  Patient Details  Name: Jeanette Jones MRN: [id number] Date of Birth: 01/24/1956  Subjective/Objective:                  RNCM spoke with patient regarding discharge planning. She states that her children live with her. She does not drive. She is usually independent with mobility per patient. She states that she has tried to use [facility] for transportation but she has missed a few MD appointments "because [facility] not showing up".  She states she is in good standing with [facility] through regardless of "no shows".  She does not have DME at home including supplemental O2.  She is not certain she wants home health PT arranged for discharge.   She has a aide that comes out to her her with ADLs.    Action/Plan:   RNCM to follow.  Expected Discharge Date:  03/21/17               Expected Discharge Plan:     In-House Referral:     Discharge planning Services  CM Consult  Post Acute Care Choice:  Home Health Choice offered to:  Patient  DME Arranged:    DME Agency:     HH Arranged:    [facility] Agency:     Status of Service:  In process, will continue to follow  If discussed at Long Length of Stay Meetings, dates discussed:    Additional Comments:  Marshell Garfinkel, RN 03/22/2017, 8:22 AM

## 2017-03-23 LAB — CBC
HEMATOCRIT: 24.5 % — AB (ref 35.0–47.0)
Hemoglobin: 8.4 g/dL — ABNORMAL LOW (ref 12.0–16.0)
MCH: 30.7 pg (ref 26.0–34.0)
MCHC: 34.3 g/dL (ref 32.0–36.0)
MCV: 89.3 fL (ref 80.0–100.0)
Platelets: 284 10*3/uL (ref 150–440)
RBC: 2.74 MIL/uL — ABNORMAL LOW (ref 3.80–5.20)
RDW: 17.1 % — AB (ref 11.5–14.5)
WBC: 3.9 10*3/uL (ref 3.6–11.0)

## 2017-03-23 LAB — PHOSPHORUS: PHOSPHORUS: 1.9 mg/dL — AB (ref 2.5–4.6)

## 2017-03-23 LAB — POTASSIUM: Potassium: 4.6 mmol/L (ref 3.5–5.1)

## 2017-03-23 LAB — MAGNESIUM: MAGNESIUM: 1.5 mg/dL — AB (ref 1.7–2.4)

## 2017-03-23 MED ORDER — SODIUM PHOSPHATES 45 MMOLE/15ML IV SOLN
15.0000 mmol | Freq: Once | INTRAVENOUS | Status: AC
  Administered 2017-03-23: 11:00:00 15 mmol via INTRAVENOUS
  Filled 2017-03-23: qty 5

## 2017-03-23 MED ORDER — CEPHALEXIN 500 MG PO CAPS
500.0000 mg | ORAL_CAPSULE | Freq: Two times a day (BID) | ORAL | Status: DC
  Administered 2017-03-23 – 2017-03-24 (×3): 500 mg via ORAL
  Filled 2017-03-23 (×3): qty 1

## 2017-03-23 MED ORDER — MAGNESIUM SULFATE 2 GM/50ML IV SOLN
2.0000 g | Freq: Once | INTRAVENOUS | Status: AC
  Administered 2017-03-23: 09:00:00 2 g via INTRAVENOUS
  Filled 2017-03-23: qty 50

## 2017-03-23 NOTE — Progress Notes (Signed)
Pt tolerating DYS 3 diet today- still poor appetite but no vomitting. Pt c/o reflux but is constantly laying sideways in bed. Pt encouraged to sit up in the chair or elevate HOB to prevent reflux symptoms. Magnesium, Phosphorus, and Potassium replaced today.

## 2017-03-23 NOTE — Consult Note (Addendum)
MEDICATION RELATED CONSULT NOTE - INITIAL   Pharmacy Consult for electrolytes Indication: low phosphorus  Allergies  Allergen Reactions  . Carboplatin Anaphylaxis   Patient Measurements: Height: 5\' 4"  (162.6 cm) Weight: 116 lb 12.8 oz (53 kg) IBW/kg (Calculated) : 54.7    Vital Signs: Temp: 99.8 F (37.7 C) (11/06 0446) Temp Source: Oral (11/06 0446) BP: 121/73 (11/06 0446) Pulse Rate: 85 (11/06 0446)  Labs: Recent Labs    03/21/17 0456 03/21/17 1720 03/22/17 0443 03/22/17 1438 03/23/17 0344  WBC 4.9  --  4.4  --  3.9  HGB 8.2*  --  8.4*  --  8.4*  HCT 23.9*  --  25.1*  --  24.5*  PLT 266  --  273  --  284  CREATININE 0.60  --  0.58  --   --   MG 1.8  --  1.2* 2.0 1.5*  PHOS <1.0* 2.7 1.6*  --  1.9*   Estimated Creatinine Clearance: 63.4 mL/min (by C-G formula based on SCr of 0.58 mg/dL).  Assessment: Patient is a 61 year old female with dysphagia, dehydration, abdominal pain, and diarrhea. Patient was hypokalemic and hypophosphatemic on admission.  Patient is having difficulty swallowing medications and is currently NPO.  Goal of Therapy: Electrolytes WNL K=3.5-5 Mg=1.8-24 Phos=2.5-4.6  Plan:  K = 4.6 is WNL on NS w/40 mEq KCl running at 75 mL/hr (rate was decreased yesterday).  Corrected Ca = 8.8, no supplementation needed at this time. Mg = 1.5, Phos = 1.9 are both low  Will order magnesium 2 g IV once Will order sodium phosphate 15 mmol IV once  Will recheck electrolytes with AM labs tomorrow.  Lenis Noon, PharmD, BCPS 03/23/17 9:11 AM

## 2017-03-23 NOTE — Progress Notes (Signed)
Subjective: Patient seen for f/u dysphagia. Patient tolerating po without frank dysphagia. Tolerating pinto beans, steak, full liquids.  Objective: Vital signs in last 24 hours: Temp:  [97.9 F (36.6 C)-99.8 F (37.7 C)] 99.8 F (37.7 C) (11/06 0446) Pulse Rate:  [70-91] 85 (11/06 0446) Resp:  [15-18] 15 (11/06 0446) BP: (109-121)/(66-83) 121/73 (11/06 0446) SpO2:  [98 %-100 %] 98 % (11/06 0446) Weight:  [52.8 kg (116 lb 5 oz)-53 kg (116 lb 12.8 oz)] 53 kg (116 lb 12.8 oz) (11/06 0446) Blood pressure 121/73, pulse 85, temperature 99.8 F (37.7 C), temperature source Oral, resp. rate 15, height 5\' 4"  (1.626 m), weight 53 kg (116 lb 12.8 oz), SpO2 98 %.   Intake/Output from previous day: 11/05 0701 - 11/06 0700 In: 1827.1 [P.O.:480; I.V.:1043.8; IV Piggyback:303.3] Out: -   Intake/Output this shift: Total I/O In: 120 [P.O.:120] Out: -    General appearance:  Alert, nad Resp:  CTA Cardio:  RR nl S1,S2  GI:  abd soft, bs+ Extremities:  No edema, + muscle atrophy.   Lab Results: Results for orders placed or performed during the hospital encounter of 03/19/17 (from the past 24 hour(s))  Magnesium     Status: None   Collection Time: 03/22/17  2:38 PM  Result Value Ref Range   Magnesium 2.0 1.7 - 2.4 mg/dL  Potassium     Status: None   Collection Time: 03/23/17  3:44 AM  Result Value Ref Range   Potassium 4.6 3.5 - 5.1 mmol/L  Magnesium     Status: Abnormal   Collection Time: 03/23/17  3:44 AM  Result Value Ref Range   Magnesium 1.5 (L) 1.7 - 2.4 mg/dL  Phosphorus     Status: Abnormal   Collection Time: 03/23/17  3:44 AM  Result Value Ref Range   Phosphorus 1.9 (L) 2.5 - 4.6 mg/dL  CBC     Status: Abnormal   Collection Time: 03/23/17  3:44 AM  Result Value Ref Range   WBC 3.9 3.6 - 11.0 K/uL   RBC 2.74 (L) 3.80 - 5.20 MIL/uL   Hemoglobin 8.4 (L) 12.0 - 16.0 g/dL   HCT 24.5 (L) 35.0 - 47.0 %   MCV 89.3 80.0 - 100.0 fL   MCH 30.7 26.0 - 34.0 pg   MCHC 34.3 32.0 -  36.0 g/dL   RDW 17.1 (H) 11.5 - 14.5 %   Platelets 284 150 - 440 K/uL     Recent Labs    03/21/17 0456 03/22/17 0443 03/23/17 0344  WBC 4.9 4.4 3.9  HGB 8.2* 8.4* 8.4*  HCT 23.9* 25.1* 24.5*  PLT 266 273 284   BMET Recent Labs    03/21/17 0456 03/22/17 0443 03/23/17 0344  NA 130* 130*  --   K 4.2 4.5 4.6  CL 109 107  --   CO2 18* 19*  --   GLUCOSE 116* 102*  --   BUN 8 <5*  --   CREATININE 0.60 0.58  --   CALCIUM 7.7* 7.6*  --    LFT No results for input(s): PROT, ALBUMIN, AST, ALT, ALKPHOS, BILITOT, BILIDIR, IBILI in the last 72 hours. PT/INR No results for input(s): LABPROT, INR in the last 72 hours. Hepatitis Panel No results for input(s): HEPBSAG, HCVAB, HEPAIGM, HEPBIGM in the last 72 hours. C-Diff No results for input(s): CDIFFTOX in the last 72 hours. No results for input(s): CDIFFPCR in the last 72 hours.   Studies/Results: Dg Esophagus  Result Date: 03/22/2017 CLINICAL DATA:  Chronic dysphagia, increasing severity over the past few days especially solid foods and pills. Patient underwent esophageal dilation 3 years ago with some improvement. The patient is very weak and is currently receiving chemotherapy for lung malignancy. EXAM: ESOPHOGRAM/BARIUM SWALLOW TECHNIQUE: Single contrast examination was performed using thin barium or water soluble. A double contrast study a could not be performed due to the patient's profound weakness. FLUOROSCOPY TIME:  Fluoroscopy Time:  0 minutes, 30 seconds Radiation Exposure Index (if provided by the fluoroscopic device): 723 micro Gy per meters square Number of Acquired Spot Images: 1+ 1 video loop. COMPARISON:  Barium esophagram of January 28, 2016 FINDINGS: Hypopharynx distended well. There was no laryngeal penetration of the barium. The cervical esophagus distended reasonably well. 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 Distal to this the mid  and lower esophagus distended well. A tiny reducible hiatal hernia is suspected. The patient was unable to mobilize the barium tablet to swallow it. IMPRESSION: There is an approximately 4 cm segment of moderate narrowing of the proximal thoracic esophagus similar to that seen on the previous study. Direct visualization with consideration for esophageal dilation would be useful when the patient can tolerate the procedure. Electronically Signed   By: David  Martinique M.D.   On: 03/22/2017 08:17    Scheduled Inpatient Medications:   . chlorhexidine  15 mL Mouth Rinse BID  . cyclobenzaprine  10 mg Oral QHS  . feeding supplement (ENSURE ENLIVE)  237 mL Oral QID  . mouth rinse  15 mL Mouth Rinse q12n4p  . mometasone-formoterol  2 puff Inhalation BID  . multivitamin  15 mL Oral Daily    Continuous Inpatient Infusions:   . 0.9 % NaCl with KCl 40 mEq / L 75 mL/hr (03/23/17 0122)  . small volume/piggyback Museum/gallery curator    . sodium phosphate  Dextrose 5% IVPB 15 mmol (03/23/17 1127)    PRN Inpatient Medications:  acetaminophen **OR** acetaminophen, alum & mag hydroxide-simeth, loperamide, ondansetron **OR** ondansetron (ZOFRAN) IV, sodium chloride  Miscellaneous:   Assessment:  1. Dysphagia - Secondary to esophageal extrinsic compression causing stricture. No significant change in stricture on Barium swallow. Patient tolerating po for now.  2. Lung cancer    Plan:  1. Continue diet as tolerated.  2. Will sign off. Call back if we can help.  Teodoro K. Alice Reichert, M.D. 03/23/2017, 12:32 PM

## 2017-03-23 NOTE — Progress Notes (Signed)
Speech Therapy Note: Chart reviewed; Discussed pt's GI status w/ GI(MD). Noted the Barium Study indicating ~4 cm segment of moderate narrowing of the proximal thoracic esophagus similar to that seen on the previous study in 2017. Direct visualization with consideration for esophageal dilation would be useful when the patient can tolerate the procedure.  NSG consulted then met w/ pt. NSG reported pt tolerating liquid meds and no regurgitation was noted. Pt reported that she ate "a little bit of cereal and a few sips of Ensure without throwing up", however, pt reported min gagging w/ cereal. Per report, pt is not eating "too much" currently.  Pt was instructed on general aspiration precautions including sitting upright, small bites/sips, and reducing distractions during meals - MUST eat slowly and clear mouth between bites of foods. ST recommended general REFLUX precautions; handouts given. NSG updated.  No further skilled ST services indicated at this time. Suspect pt's Esophageal dysmotility and regurgitation is related to the moderate narrowing noted on the Barium Study - any regurgitation can increase a pt's risk for aspiration of the REFLUX material. GI is following pt now. ST services will be available for any further education on oropharyngeal phase swallowing while she is admitted; NSG to reconsult accordingly.   Carolynn Sayers, SLP-Graduate Student Carolynn Sayers 03/23/17; 2:17 PM   The information in this patient note, response to treatment, and overall treatment plan developed has been reviewed and agreed upon by this clinician.  Orinda Kenner, [place], [place] [phone] 03/23/17,5:21 PM

## 2017-03-23 NOTE — Progress Notes (Signed)
[facility] [place] NAME: Jeanette Jones    MR#:  [id number]  DATE OF BIRTH:  [birth year]  SUBJECTIVE:  CHIEF COMPLAINT:   Chief Complaint  Patient presents with  . Loss of Consciousness    Metastatic cancer, have difficulty swallowing food and meds and having nausea. Came with severe dehydration and suspected septic. Patient is afebrile today, patient is tolerating mechanical soft diet.  She had pancakes, cereal, pinto beans without any difficulty REVIEW OF SYSTEMS:  CONSTITUTIONAL: No fever,positive for fatigue or weakness.  EYES: No blurred or double vision.  EARS, NOSE, AND THROAT: No tinnitus or ear pain.  Reporting difficulty with swallowing or RESPIRATORY: No cough, shortness of breath, wheezing or hemoptysis.  CARDIOVASCULAR: No chest pain, orthopnea, edema.  GASTROINTESTINAL: No nausea, vomiting, diarrhea or abdominal pain.  GENITOURINARY: No dysuria, hematuria.  ENDOCRINE: No polyuria, nocturia,  HEMATOLOGY: No anemia, easy bruising or bleeding SKIN: No rash or lesion. MUSCULOSKELETAL: No joint pain or arthritis.   NEUROLOGIC: No tingling, numbness, weakness.  PSYCHIATRY: No anxiety or depression.   ROS  DRUG ALLERGIES:   Allergies  Allergen Reactions  . Carboplatin Anaphylaxis    VITALS:  Blood pressure 108/73, pulse 72, temperature 99.8 F (37.7 C), temperature source Oral, resp. rate 20, height 5\' 4"  (1.626 m), weight 53 kg (116 lb 12.8 oz), SpO2 99 %.  PHYSICAL EXAMINATION:  GENERAL:  61 y.o.-year-old thin patient lying in the bed with no acute distress.  EYES: Pupils equal, round, reactive to light and accommodation. No scleral icterus. Extraocular muscles intact.  HEENT: Head atraumatic, normocephalic. Oropharynx and nasopharynx clear.  NECK:  Supple, no jugular venous distention. No thyroid enlargement, no tenderness.  LUNGS: Normal breath sounds bilaterally, no wheezing, rales,rhonchi or crepitation. No use of  accessory muscles of respiration.  CARDIOVASCULAR: S1, S2 normal. No murmurs, rubs, or gallops.  ABDOMEN: Soft, nontender, nondistended. Bowel sounds present. No organomegaly or mass.  EXTREMITIES: No pedal edema, cyanosis, or clubbing.  NEUROLOGIC: Cranial nerves II through XII are intact. Muscle strength 4/5 in all extremities. Sensation intact. Gait not checked.  PSYCHIATRIC: The patient is alert and oriented x 3.  SKIN: No obvious rash, lesion, or ulcer.   Physical Exam LABORATORY PANEL:   CBC Recent Labs  Lab 03/23/17 0344  WBC 3.9  HGB 8.4*  HCT 24.5*  PLT 284   ------------------------------------------------------------------------------------------------------------------  Chemistries  Recent Labs  Lab 03/20/17 0627  03/22/17 0443  03/23/17 0344  NA 132*   < > 130*  --   --   K 3.4*   < > 4.5  --  4.6  CL 109   < > 107  --   --   CO2 17*   < > 19*  --   --   GLUCOSE 118*   < > 102*  --   --   BUN 30*   < > <5*  --   --   CREATININE 0.83   < > 0.58  --   --   CALCIUM 8.1*   < > 7.6*  --   --   MG 2.0   < > 1.2*   < > 1.5*  AST 71*  --   --   --   --   ALT 32  --   --   --   --   ALKPHOS 67  --   --   --   --   BILITOT 0.5  --   --   --   --    < > =  values in this interval not displayed.   ------------------------------------------------------------------------------------------------------------------  Cardiac Enzymes Recent Labs  Lab 03/19/17 0041  TROPONINI <0.03   ------------------------------------------------------------------------------------------------------------------  RADIOLOGY:  Dg Esophagus  Result Date: 03/22/2017 CLINICAL DATA:  Chronic dysphagia, increasing severity over the past few days especially solid foods and pills. Patient underwent esophageal dilation 3 years ago with some improvement. The patient is very weak and is currently receiving chemotherapy for lung malignancy. EXAM: ESOPHOGRAM/BARIUM SWALLOW TECHNIQUE: Single  contrast examination was performed using thin barium or water soluble. A double contrast study a could not be performed due to the patient's profound weakness. FLUOROSCOPY TIME:  Fluoroscopy Time:  0 minutes, 30 seconds Radiation Exposure Index (if provided by the fluoroscopic device): 723 micro Gy per meters square Number of Acquired Spot Images: 1+ 1 video loop. COMPARISON:  Barium esophagram of January 28, 2016 FINDINGS: Hypopharynx distended well. There was no laryngeal penetration of the barium. The cervical esophagus distended reasonably well. 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 Distal to this the mid and lower esophagus distended well. A tiny reducible hiatal hernia is suspected. The patient was unable to mobilize the barium tablet to swallow it. IMPRESSION: There is an approximately 4 cm segment of moderate narrowing of the proximal thoracic esophagus similar to that seen on the previous study. Direct visualization with consideration for esophageal dilation would be useful when the patient can tolerate the procedure. Electronically Signed   By: David  Martinique M.D.   On: 03/22/2017 08:17    ASSESSMENT AND PLAN:   Active Problems:   Sepsis ([place])   * sepsis secondary to UTI and possible postobstructive pneumonia    Ur cx have Gram neg rods E. coli which is pansensitive Blood cultures are negative so far Patient is on  IV Zosyn and will switch to narrow spectrum antibiotic cefazolin, changed to p.o. Keflex  Supportive treatment provided with IV fluids  *Acute renal failure secondary to dehydration Clinically improved with IV fluids.  * hypotension Resolved with IV fluids  *Metastatic squamous cell lung cancer status post multiple lines of therapy  lung cancer with right upper lobe mass possible postobstructive pneumonia Oncology Dr. Seward Meth is following recommending to, hold further chemotherapy as  patient is tolerating chemo poorly    * Dysphagia   Likely from extrinsic compression from the paratracheal and some cranial lymphadenopathy from lung cancer    SLP eval. patient is tolerating-pured diet and soft meats    In Sept 2017- had esophageal stricture, Dilation done barium swallow in a.m. has revealed narrowing of thoracic part of the esophagus GI is recommending a mechanical soft diet.  GI signed off  * acute pancreatitis with elevated lipase and nausea Tolerating pured diet Patient was seen and evaluated by gastroenterology,  does not think  that the patient has pancreatitis Repeat lipase in a.m. CT abdomen revealed fluid-filled colon, no bowel obstruction, right lower lobe mass, recommending PET scan for better characterization-outpatient follow-up with oncology after discharge  * Hypokalemia and hyponatremia Repleted  and recheck potassium4.6 and magnesium 1.9  Check BMP in a.m.  *Dark colored stool?  Melena Negative stool for Hemoccult ,follow-up with GI PRN Hemoglobin stable  PT eval   All the records are reviewed and case discussed with Care Management/Social Workerr. Management plans discussed with the patient, family and they are in agreement.  CODE STATUS: full.  TOTAL TIME TAKING CARE OF THIS PATIENT: 45 minutes.  Spoke to  her daughter in room.  POSSIBLE D/C IN am DAYS, DEPENDING ON CLINICAL CONDITION.   Nicholes Mango M.D on 03/23/2017   Between 7am to 6pm - Pager - [phone]  After 6pm go to [email or web address] - password [email or web address] [facility]  Office  [phone]  CC: Primary care physician; Center, [place]  Note: This dictation was prepared with Diplomatic Services operational officer dictation along with smaller phrase technology. Any transcriptional errors that result from this process are unintentional.

## 2017-03-23 NOTE — Progress Notes (Signed)
Physical Therapy Treatment Patient Details Name: Jeanette Jones MRN: [id number] DOB: 15-Oct-1955 Today's Date: 03/23/2017    History of Present Illness Jeanette Jones is a 61yo black female who comes to [facility] on 11/2 after syncopal episode at home. PMH: Lung CA (currents receiving chemo/radiation), HTN, Hep C, emphesema, Knee pain, Shoulder pain, falls at home. At baseline patient performs mostly household AMB without A/E, lives with 2 daughters adn gradnchildren. Daughters assist with groceries, househwork, and meals. The patient reports that self care is difficult to perform, msotly bathing, but that no one helps her. She does nto drive, and takes the bus to the cancer center for treatments.     PT Comments    Pt did much better today than previous PT sessions.  She needed only minimal cuing during ambulation and showed good safety and confidence with prolonged ambulation.  She was able to walk ~100 ft w/o AD after the first 300 ft with AD.  Pt with very little fatigue and reported feeling good t/o the effort.  Pt agrees that she likely does not require further PT intervention, PT agrees that she is at/near baseline and should be independent (with the previous level of assist from daughters) w/o HHPT.    Follow Up Recommendations  No PT follow up;Home health PT(pt did very well today, likely does not require HHPT )     Equipment Recommendations  None recommended by PT    Recommendations for Other Services       Precautions / Restrictions Precautions Precautions: Fall Restrictions Weight Bearing Restrictions: No    Mobility  Bed Mobility Overal bed mobility: Independent                Transfers Overall transfer level: Independent Equipment used: Rolling walker (2 wheeled) Transfers: Sit to/from [employer] transfer comment: Pt able to rise w/o assist, showed good confidence  Ambulation/Gait Ambulation/Gait assistance: Modified independent (Device/Increase  time) Ambulation Distance (Feet): 400 Feet Assistive device: Rolling walker (2 wheeled);None       General Gait Details: ~300 ft with walker and ~100 ft w/o AD, pt walked with good speed, confidence and safety. No significant fatigue, vitals stable t/o effort   Stairs            Wheelchair Mobility    Modified Rankin (Stroke Patients Only)       Balance                                            Cognition Arousal/Alertness: Awake/alert Behavior During Therapy: WFL for tasks assessed/performed Overall Cognitive Status: Within Functional Limits for tasks assessed                                        Exercises      General Comments        Pertinent Vitals/Pain Pain Assessment: No/denies pain    Home Living                      Prior Function            PT Goals (current goals can now be found in the care plan section) Progress towards PT goals: Progressing toward goals    Frequency  Min 2X/week      PT Plan Current plan remains appropriate    Co-evaluation              AM-PAC PT "6 Clicks" Daily Activity  Outcome Measure  Difficulty turning over in bed (including adjusting bedclothes, sheets and blankets)?: None Difficulty moving from lying on back to sitting on the side of the bed? : None Difficulty sitting down on and standing up from a chair with arms (e.g., wheelchair, bedside commode, etc,.)?: None Help needed moving to and from a bed to chair (including a wheelchair)?: None Help needed walking in hospital room?: None Help needed climbing 3-5 steps with a railing? : None 6 Click Score: 24    End of Session Equipment Utilized During Treatment: Gait belt Activity Tolerance: Patient tolerated treatment well Patient left: with bed alarm set;with call bell/phone within reach Nurse Communication: Mobility status PT Visit Diagnosis: Unsteadiness on feet (R26.81);Other abnormalities of gait  and mobility (R26.89);History of falling (Z91.81);Dizziness and giddiness (R42)     Time: 5883-2549 PT Time Calculation (min) (ACUTE ONLY): 11 min  Charges:  $Gait Training: 8-22 mins                    G Codes:       Kreg Shropshire, DPT 03/23/2017, 5:34 PM

## 2017-03-24 LAB — BASIC METABOLIC PANEL
Anion gap: 3 — ABNORMAL LOW (ref 5–15)
BUN: <5 mg/dL — ABNORMAL LOW (ref 6–20)
CO2: 22 mmol/L (ref 22–32)
Calcium: 8.2 mg/dL — ABNORMAL LOW (ref 8.9–10.3)
Chloride: 108 mmol/L (ref 101–111)
Creatinine, Ser: 0.37 mg/dL — ABNORMAL LOW (ref 0.44–1.00)
GFR calc Af Amer: >60 mL/min (ref >60)
GFR calc non Af Amer: >60 mL/min (ref >60)
Glucose, Bld: 90 mg/dL (ref 65–99)
Potassium: 4.6 mmol/L (ref 3.5–5.1)
Sodium: 133 mmol/L — ABNORMAL LOW (ref 135–145)

## 2017-03-24 LAB — CULTURE, BLOOD (ROUTINE X 2)
CULTURE: NO GROWTH
Culture: NO GROWTH
Special Requests: ADEQUATE
Special Requests: ADEQUATE

## 2017-03-24 LAB — MAGNESIUM: Magnesium: 1.5 mg/dL — ABNORMAL LOW (ref 1.7–2.4)

## 2017-03-24 LAB — PHOSPHORUS: Phosphorus: 3.1 mg/dL (ref 2.5–4.6)

## 2017-03-24 LAB — LIPASE, BLOOD: Lipase: 84 U/L — ABNORMAL HIGH (ref 11–51)

## 2017-03-24 MED ORDER — HEPARIN SOD (PORK) LOCK FLUSH 100 UNIT/ML IV SOLN
500.0000 [IU] | Freq: Once | INTRAVENOUS | Status: DC
  Filled 2017-03-24: qty 5

## 2017-03-24 MED ORDER — ALUM & MAG HYDROXIDE-SIMETH 200-200-20 MG/5ML PO SUSP: 15.0000 mL | Freq: Four times a day (QID) | ORAL | 0 refills | Status: AC | PRN

## 2017-03-24 MED ORDER — ONDANSETRON 4 MG PO TBDP
4.0000 mg | ORAL_TABLET | Freq: Four times a day (QID) | ORAL | Status: DC | PRN
  Filled 2017-03-24: qty 1

## 2017-03-24 MED ORDER — CEPHALEXIN 125 MG/5ML PO SUSR
500.0000 mg | Freq: Two times a day (BID) | ORAL | Status: DC
  Filled 2017-03-24: qty 10

## 2017-03-24 MED ORDER — ENSURE ENLIVE PO LIQD
237.0000 mL | Freq: Four times a day (QID) | ORAL | 12 refills | Status: DC
Start: 2017-03-24 — End: 2017-06-24

## 2017-03-24 MED ORDER — ADULT MULTIVITAMIN LIQUID CH: 15.0000 mL | Freq: Every day | ORAL | 1 refills | Status: AC

## 2017-03-24 MED ORDER — MAGNESIUM SULFATE 4 GM/100ML IV SOLN
4.0000 g | Freq: Once | INTRAVENOUS | Status: AC
  Administered 2017-03-24: 10:00:00 4 g via INTRAVENOUS
  Filled 2017-03-24: qty 100

## 2017-03-24 MED ORDER — ACETAMINOPHEN 160 MG/5ML PO SOLN: 650.0000 mg | Freq: Four times a day (QID) | ORAL | 0 refills | Status: AC | PRN

## 2017-03-24 MED ORDER — ACETAMINOPHEN 160 MG/5ML PO SOLN
650.0000 mg | Freq: Four times a day (QID) | ORAL | Status: DC | PRN
  Filled 2017-03-24: qty 20.3

## 2017-03-24 MED ORDER — CEPHALEXIN 125 MG/5ML PO SUSR: 500.0000 mg | Freq: Two times a day (BID) | ORAL | 0 refills | Status: AC

## 2017-03-24 NOTE — Discharge Summary (Signed)
[facility] [place] NAME: Jeanette Jones    MR#:  [id number]  DATE OF BIRTH:  14-Apr-1956  DATE OF ADMISSION:  03/19/2017 ADMITTING PHYSICIAN: Harrie Foreman, MD  DATE OF DISCHARGE: 03/24/17   PRIMARY CARE PHYSICIAN: [facility] [place]    ADMISSION DIAGNOSIS:  Dehydration [E86.0] Hypokalemia [E87.6] Acute kidney injury ([place]) [N17.9] Severe sepsis ([place]) [A41.9, R65.20]  DISCHARGE DIAGNOSIS:  Active Problems:   Sepsis ([place])   SECONDARY DIAGNOSIS:   Past Medical History:  Diagnosis Date  . Back pain, chronic   . Carcinoma of lung ([place]) 07/2011  . Cough   . Depression   . Dyspnea   . Emphysema of lung ([place])   . H/O tubal ligation 02/12/2017   From chart  . Heart murmur   . Hep C w/o coma, chronic ([place])   . Hypertension   . Knee pain 02/12/2017  . Lung cancer ([place])   . Shoulder pain 02/09/2017  . Swallowing difficulty     HOSPITAL COURSE:   HPI: The patient with past medical history of lung cancer currently undergoing chemo and radiation, hypertension and hep C presents to the emergency department after an episode of syncope.  The patient's daughter found her at home presumably very briefly after falling.  The patient denies hurting herself but does not recall if she hit her head.  CT of her head did not show any intracranial abnormalities.  However, the patient was febrile upon arrival and vital signs met sepsis criteria which prompted initiation of sepsis protocol.  The patient admits to feeling normal malaise and weakness for approximately 4 days.  She admits to some nausea but denies vomiting.  Her blood pressure was low in the emergency department but mildly fluid responsive.  Once she was stabilized the emergency department staff called the hospitalist service for admission.  * sepsis secondary to UTI and possible postobstructive pneumonia    Ur cx have Gram neg rods E. coli which is  pansensitive Blood cultures are negative so far Patient is on  IV Zosyn and will switch to narrow spectrum antibiotic cefazolin, changed to p.o. Keflex  Supportive treatment provided with IV fluids  *Acute renal failure secondary to dehydration Clinically improved with IV fluids.  * hypotension Resolved with IV fluids  *Metastatic squamous cell lung cancer status post multiple lines of therapy  lung cancer with right upper lobe mass possible postobstructive pneumonia Oncology Dr. Seward Meth is following recommending to, hold further chemotherapy as patient is tolerating chemo poorly, patient might need radiation therapy as an outpatient.  Follow-up with oncology after discharge    * Dysphagia   Likely from extrinsic compression from the paratracheal and some cranial lymphadenopathy from lung cancer    SLP eval. patient is tolerating-pured diet and soft meats    In Sept 2017- had esophageal stricture, Dilation done barium swallow in a.m. has revealed narrowing of thoracic part of the esophagus GI is recommending a mechanical soft diet.  GI signed off  * acute pancreatitis with elevated lipase and nausea Tolerating mechanical soft diet Patient was seen and evaluated by gastroenterology,  does not think  that the patient has pancreatitis Repeat lipase is trending down at 8.4 patient is asymptomatic CT abdomen revealed fluid-filled colon, no bowel obstruction, right lower lobe mass, recommending PET scan for better characterization-outpatient follow-up with oncology after discharge  * Hypokalemia and hyponatremia Repleted  and recheck potassium4.6 and magnesium 1.9  Repeat magnesium 1.5  today repleted with 4 g of magnesium sulfate  *Dark colored stool?  Melena Negative stool for Hemoccult ,follow-up with GI PRN Hemoglobin stable   DISCHARGE CONDITIONS:   fair  CONSULTS OBTAINED:  Treatment Team:  Cammie Sickle, MD   PROCEDURES   DRUG ALLERGIES:   Allergies   Allergen Reactions  . Carboplatin Anaphylaxis    DISCHARGE MEDICATIONS:   Current Discharge Medication List    START taking these medications   Details  acetaminophen (TYLENOL) 160 MG/5ML solution Take 20.3 mLs (650 mg total) every 6 (six) hours as needed by mouth for mild pain or fever. Qty: 120 mL, Refills: 0    alum & mag hydroxide-simeth (MAALOX/MYLANTA) 956-213-08 MG/5ML suspension Take 15 mLs every 6 (six) hours as needed by mouth for indigestion or heartburn. Qty: 355 mL, Refills: 0    cephALEXin (KEFLEX) 125 MG/5ML suspension Take 20 mLs (500 mg total) 2 (two) times daily for 8 days by mouth. Qty: 170 mL, Refills: 0    !! feeding supplement, ENSURE COMPLETE, (ENSURE COMPLETE) LIQD Take 237 mLs by mouth 2 (two) times daily between meals. Qty: 60 Bottle, Refills: 0    !! feeding supplement, ENSURE ENLIVE, (ENSURE ENLIVE) LIQD Take 237 mLs 4 (four) times daily by mouth. Qty: 237 mL, Refills: 12    Multiple Vitamin (MULTIVITAMIN) LIQD Take 15 mLs daily by mouth. Qty: 500 mL, Refills: 1     !! - Potential duplicate medications found. Please discuss with provider.    CONTINUE these medications which have NOT CHANGED   Details  cyclobenzaprine (FLEXERIL) 10 MG tablet Take 10 mg by mouth at bedtime.    Fluticasone-Salmeterol (ADVAIR DISKUS) 500-50 MCG/DOSE AEPB Inhale 1 puff into the lungs 2 (two) times daily. Qty: 1 each, Refills: 3    ondansetron (ZOFRAN) 8 MG tablet Take 1 tablet (8 mg total) by mouth every 8 (eight) hours as needed for nausea or vomiting (start 3 days; after chemo). Qty: 40 tablet, Refills: 1    prochlorperazine (COMPAZINE) 10 MG tablet Take 1 tablet (10 mg total) by mouth every 6 (six) hours as needed for nausea or vomiting. Qty: 40 tablet, Refills: 1      STOP taking these medications     lidocaine-prilocaine (EMLA) cream      potassium chloride 20 MEQ/15ML (10%) SOLN      traMADol (ULTRAM) 50 MG tablet          DISCHARGE INSTRUCTIONS:    Follow-up with primary care physician in 1 week Follow-up with oncology Dr. Mayer Masker in 1 week  DIET:  Dysphagia level 3(mech soft) w/ more MINCED MEATS and Gravy to moisten; Thin liquids - monitor straw use and not use if coughing noted; general aspiration precautions; Pills in a Liquid or Powder or Chewable form, or broken down well in a Puree for easier, safer Esophageal clearing d/t stricture in thoracic Esophagus  DISCHARGE CONDITION:  Fair  ACTIVITY:  Activity as tolerated  OXYGEN:  Home Oxygen: No.   Oxygen Delivery: room air  DISCHARGE LOCATION:  home   If you experience worsening of your admission symptoms, develop shortness of breath, life threatening emergency, suicidal or homicidal thoughts you must seek medical attention immediately by calling 911 or calling your MD immediately  if symptoms less severe.  You Must read complete instructions/literature along with all the possible adverse reactions/side effects for all the Medicines you take and that have been prescribed to you. Take any new Medicines after you have completely understood and accpet  all the possible adverse reactions/side effects.   Please note  You were cared for by a hospitalist during your hospital stay. If you have any questions about your discharge medications or the care you received while you were in the hospital after you are discharged, you can call the unit and asked to speak with the hospitalist on call if the hospitalist that took care of you is not available. Once you are discharged, your primary care physician will handle any further medical issues. Please note that NO REFILLS for any discharge medications will be authorized once you are discharged, as it is imperative that you return to your primary care physician (or establish a relationship with a primary care physician if you do not have one) for your aftercare needs so that they can reassess your need for medications and monitor your lab  values.     Today  Chief Complaint  Patient presents with  . Loss of Consciousness   Patient is tolerating mechanical soft diet but having hoarse voice.  Oncologist is recommending outpatient radiation therapy according to the patient  ROS:  CONSTITUTIONAL: Denies fevers, chills. Denies any fatigue, weakness.  EYES: Denies blurry vision, double vision, eye pain. EARS, NOSE, THROAT: Denies tinnitus, ear pain, hearing loss. RESPIRATORY: Denies cough, wheeze, shortness of breath.  CARDIOVASCULAR: Denies chest pain, palpitations, edema.  GASTROINTESTINAL: Denies nausea, vomiting, diarrhea, abdominal pain. Denies bright red blood per rectum. GENITOURINARY: Denies dysuria, hematuria. ENDOCRINE: Denies nocturia or thyroid problems. HEMATOLOGIC AND LYMPHATIC: Denies easy bruising or bleeding. SKIN: Denies rash or lesion. MUSCULOSKELETAL: Denies pain in neck, back, shoulder, knees, hips or arthritic symptoms.  NEUROLOGIC: Denies paralysis, paresthesias.  PSYCHIATRIC: Denies anxiety or depressive symptoms.   VITAL SIGNS:  Blood pressure 113/68, pulse 89, temperature 98.1 F (36.7 C), temperature source Oral, resp. rate 18, height 5' 4"  (1.626 m), weight 53 kg (116 lb 12.8 oz), SpO2 100 %.  I/O:    Intake/Output Summary (Last 24 hours) at 03/24/2017 1529 Last data filed at 03/24/2017 0300 Gross per 24 hour  Intake 950 ml  Output -  Net 950 ml    PHYSICAL EXAMINATION:  GENERAL:  61 y.o.-year-old patient lying in the bed with no acute distress.  EYES: Pupils equal, round, reactive to light and accommodation. No scleral icterus. Extraocular muscles intact.  HEENT: Head atraumatic, normocephalic. Oropharynx and nasopharynx clear.  NECK:  Supple, no jugular venous distention. No thyroid enlargement, no tenderness.  LUNGS: Normal breath sounds bilaterally, no wheezing, rales,rhonchi or crepitation. No use of accessory muscles of respiration.  CARDIOVASCULAR: S1, S2 normal. No murmurs,  rubs, or gallops.  ABDOMEN: Soft, non-tender, non-distended. Bowel sounds present. No organomegaly or mass.  EXTREMITIES: No pedal edema, cyanosis, or clubbing.  NEUROLOGIC: Cranial nerves II through XII are intact. Muscle strength 5/5 in all extremities. Sensation intact. Gait not checked.  PSYCHIATRIC: The patient is alert and oriented x 3.  SKIN: No obvious rash, lesion, or ulcer.   DATA REVIEW:   CBC Recent Labs  Lab 03/23/17 0344  WBC 3.9  HGB 8.4*  HCT 24.5*  PLT 284    Chemistries  Recent Labs  Lab 03/20/17 0627  03/24/17 0516  NA 132*   < > 133*  K 3.4*   < > 4.6  CL 109   < > 108  CO2 17*   < > 22  GLUCOSE 118*   < > 90  BUN 30*   < > <5*  CREATININE 0.83   < > 0.37*  CALCIUM 8.1*   < > 8.2*  MG 2.0   < > 1.5*  AST 71*  --   --   ALT 32  --   --   ALKPHOS 67  --   --   BILITOT 0.5  --   --    < > = values in this interval not displayed.    Cardiac Enzymes Recent Labs  Lab 03/19/17 0041  TROPONINI <0.03    Microbiology Results  Results for orders placed or performed during the hospital encounter of 03/19/17  Blood culture (routine x 2)     Status: None   Collection Time: 03/19/17 12:41 AM  Result Value Ref Range Status   Specimen Description BLOOD RIGHT ANTECUBITAL  Final   Special Requests   Final    BOTTLES DRAWN AEROBIC AND ANAEROBIC Blood Culture adequate volume   Culture NO GROWTH 5 DAYS  Final   Report Status 03/24/2017 FINAL  Final  Urine culture     Status: Abnormal   Collection Time: 03/19/17 12:41 AM  Result Value Ref Range Status   Specimen Description URINE, RANDOM  Final   Special Requests NONE  Final   Culture (A)  Final    >=100,000 COLONIES/mL ESCHERICHIA COLI >=100,000 COLONIES/mL LACTOBACILLUS SPECIES Standardized susceptibility testing for this organism is not available. Performed at [facility], [place] [address], [place], [place] [address]    Report Status 03/21/2017 FINAL  Final   Organism ID, Bacteria ESCHERICHIA  COLI (A)  Final      Susceptibility   Escherichia coli - MIC*    AMPICILLIN <=2 SENSITIVE Sensitive     CEFAZOLIN <=4 SENSITIVE Sensitive     CEFTRIAXONE <=1 SENSITIVE Sensitive     CIPROFLOXACIN <=0.25 SENSITIVE Sensitive     GENTAMICIN <=1 SENSITIVE Sensitive     IMIPENEM <=0.25 SENSITIVE Sensitive     NITROFURANTOIN <=16 SENSITIVE Sensitive     TRIMETH/SULFA <=20 SENSITIVE Sensitive     AMPICILLIN/SULBACTAM <=2 SENSITIVE Sensitive     PIP/TAZO <=4 SENSITIVE Sensitive     Extended ESBL NEGATIVE Sensitive     * >=100,000 COLONIES/mL ESCHERICHIA COLI  Blood culture (routine x 2)     Status: None   Collection Time: 03/19/17 12:42 AM  Result Value Ref Range Status   Specimen Description BLOOD LEFT ANTECUBITAL  Final   Special Requests   Final    BOTTLES DRAWN AEROBIC AND ANAEROBIC Blood Culture adequate volume   Culture NO GROWTH 5 DAYS  Final   Report Status 03/24/2017 FINAL  Final  C difficile quick scan w PCR reflex     Status: None   Collection Time: 03/20/17  3:42 AM  Result Value Ref Range Status   C Diff antigen NEGATIVE NEGATIVE Final   C Diff toxin NEGATIVE NEGATIVE Final   C Diff interpretation No C. difficile detected.  Final  Gastrointestinal Panel by PCR , Stool     Status: None   Collection Time: 03/20/17  3:42 AM  Result Value Ref Range Status   Campylobacter species NOT DETECTED NOT DETECTED Final   Plesimonas shigelloides NOT DETECTED NOT DETECTED Final   Salmonella species NOT DETECTED NOT DETECTED Final   Yersinia enterocolitica NOT DETECTED NOT DETECTED Final   Vibrio species NOT DETECTED NOT DETECTED Final   Vibrio cholerae NOT DETECTED NOT DETECTED Final   Enteroaggregative E coli (EAEC) NOT DETECTED NOT DETECTED Final   Enteropathogenic E coli (EPEC) NOT DETECTED NOT DETECTED Final   Enterotoxigenic E  coli (ETEC) NOT DETECTED NOT DETECTED Final   Shiga like toxin producing E coli (STEC) NOT DETECTED NOT DETECTED Final   Shigella/Enteroinvasive E coli  (EIEC) NOT DETECTED NOT DETECTED Final   Cryptosporidium NOT DETECTED NOT DETECTED Final   Cyclospora cayetanensis NOT DETECTED NOT DETECTED Final   Entamoeba histolytica NOT DETECTED NOT DETECTED Final   Giardia lamblia NOT DETECTED NOT DETECTED Final   Adenovirus F40/41 NOT DETECTED NOT DETECTED Final   Astrovirus NOT DETECTED NOT DETECTED Final   Norovirus GI/GII NOT DETECTED NOT DETECTED Final   Rotavirus A NOT DETECTED NOT DETECTED Final   Sapovirus (I, II, IV, and V) NOT DETECTED NOT DETECTED Final    RADIOLOGY:  Dg Esophagus  Result Date: 03/22/2017 CLINICAL DATA:  Chronic dysphagia, increasing severity over the past few days especially solid foods and pills. Patient underwent esophageal dilation 3 years ago with some improvement. The patient is very weak and is currently receiving chemotherapy for lung malignancy. EXAM: ESOPHOGRAM/BARIUM SWALLOW TECHNIQUE: Single contrast examination was performed using thin barium or water soluble. A double contrast study a could not be performed due to the patient's profound weakness. FLUOROSCOPY TIME:  Fluoroscopy Time:  0 minutes, 30 seconds Radiation Exposure Index (if provided by the fluoroscopic device): 723 micro Gy per meters square Number of Acquired Spot Images: 1+ 1 video loop. COMPARISON:  Barium esophagram of January 28, 2016 FINDINGS: Hypopharynx distended well. There was no laryngeal penetration of the barium. The cervical esophagus distended reasonably well. 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 Distal to this the mid and lower esophagus distended well. A tiny reducible hiatal hernia is suspected. The patient was unable to mobilize the barium tablet to swallow it. IMPRESSION: There is an approximately 4 cm segment of moderate narrowing of the proximal thoracic esophagus similar to that seen on the previous study. Direct visualization with consideration for  esophageal dilation would be useful when the patient can tolerate the procedure. Electronically Signed   By: David  Martinique M.D.   On: 03/22/2017 08:17    EKG:   Orders placed or performed during the hospital encounter of 03/19/17  . ED EKG  . ED EKG  . ED EKG 12-Lead  . ED EKG 12-Lead      Management plans discussed with the patient, she  are in agreement.  CODE STATUS:     Code Status Orders  (From admission, onward)        Start     Ordered   03/19/17 0507  Full code  Continuous     03/19/17 0506    Code Status History    Date Active Date Inactive Code Status Order ID Comments User Context   This patient has a current code status but no historical code status.      TOTAL TIME TAKING CARE OF THIS PATIENT:  45 minutes.   Note: This dictation was prepared with Dragon dictation along with smaller phrase technology. Any transcriptional errors that result from this process are unintentional.   @MEC @  on 03/24/2017 at 3:29 PM  Between 7am to 6pm - Pager - [phone]  After 6pm go to [email or web address] - password [email or web address] [facility]  Office  [phone]  CC: Primary care physician; [facility] [place]

## 2017-03-24 NOTE — Consult Note (Addendum)
MEDICATION RELATED CONSULT NOTE - INITIAL   Pharmacy Consult for electrolytes Indication: low phosphorus  Allergies  Allergen Reactions  . Carboplatin Anaphylaxis   Patient Measurements: Height: 5\' 4"  (162.6 cm) Weight: 116 lb 12.8 oz (53 kg) IBW/kg (Calculated) : 54.7    Vital Signs: Temp: 99.7 F (37.6 C) (11/07 0428) Temp Source: Oral (11/07 0428) BP: 129/64 (11/07 0428) Pulse Rate: 77 (11/07 0428)  Labs: Recent Labs    03/22/17 0443 03/22/17 1438 03/23/17 0344 03/24/17 0516  WBC 4.4  --  3.9  --   HGB 8.4*  --  8.4*  --   HCT 25.1*  --  24.5*  --   PLT 273  --  284  --   CREATININE 0.58  --   --  0.37*  MG 1.2* 2.0 1.5* 1.5*  PHOS 1.6*  --  1.9* 3.1   Estimated Creatinine Clearance: 63.4 mL/min (A) (by C-G formula based on SCr of 0.37 mg/dL (L)).  Assessment: Patient is a 61 year old female with dysphagia, dehydration, abdominal pain, and diarrhea. Patient was hypokalemic and hypophosphatemic on admission.  Patient has been having difficulty swallowing medications and food - is currently on a soft diet.  Goal of Therapy: Electrolytes WNL  Plan:  K = 4.6 is WNL on NS w/40 mEq KCl running at 50 mL/hr (rate was decreased yesterday).  Phos = 3.1 is WNL Corrected Ca = .9.4, no supplementation needed at this time.  Mg = 1.5 is low, will give magnesium 4 g IV once.  Will recheck electrolytes with AM labs tomorrow.  Lenis Noon, PharmD, BCPS 03/24/17 9:09 AM  Addendum: Supplementation will need to be in liquid or powder form if given PO.   Lenis Noon, PharmD 03/24/17 10:56 AM

## 2017-03-24 NOTE — Discharge Instructions (Signed)
Follow-up with primary care physician in 1 week Follow-up with oncology Dr. Mayer Masker in 1 week

## 2017-03-24 NOTE — Progress Notes (Signed)
NAYELIZ HIPP   DOB:1956-01-28   KK#:[id number]    Subjective: Continues to complain of difficulty swallowing regurgitation-currently on clear liquid diet.  Slowly being advanced; barium swallow-showed smooth stricture suggestive of intrinsic compression from mediastinal adenopathy.  No fevers or chills.  She feels little stronger.  Eager to go home. Objective:  Vitals:   03/24/17 0428 03/24/17 1406  BP: 129/64 113/68  Pulse: 77 89  Resp: 17 18  Temp: 99.7 F (37.6 C) 98.1 F (36.7 C)  SpO2: 100% 100%     Intake/Output Summary (Last 24 hours) at 03/24/2017 1713 Last data filed at 03/24/2017 0300 Gross per 24 hour  Intake 950 ml  Output -  Net 950 ml    GENERAL: Cachectic appearing female patient Alert, no distress and comfortable.   Alone. EYES: no pallor or icterus OROPHARYNX: no thrush or ulceration. NECK: supple, no masses felt LYMPH:  no palpable lymphadenopathy in the cervical, axillary or inguinal regions LUNGS: decreased breath sounds to auscultation at bases and  No wheeze or crackles HEART/CVS: regular rate & rhythm and no murmurs; No lower extremity edema ABDOMEN: abdomen soft, non-tender and normal bowel sounds Musculoskeletal:no cyanosis of digits and no clubbing  PSYCH: alert & oriented x 3 with fluent speech NEURO: no focal motor/sensory deficits SKIN:  no rashes or significant lesions   Labs:  Lab Results  Component Value Date   WBC 3.9 03/23/2017   HGB 8.4 (L) 03/23/2017   HCT 24.5 (L) 03/23/2017   MCV 89.3 03/23/2017   PLT 284 03/23/2017   NEUTROABS 2.6 03/19/2017    Lab Results  Component Value Date   NA 133 (L) 03/24/2017   K 4.6 03/24/2017   CL 108 03/24/2017   CO2 22 03/24/2017    Studies:  No results found.  Assessment & Plan:  61 year old female patient with metastatic lung cancer currently on single agent Abraxane is admitted to the hospital for-presumed sepsis  # Difficulty swallowing-barium swallow showed smooth stricture/suggestive  of extrinsic compression/mediastinal adenopathy.  EGD not done.  Will discuss with radiation oncology-for any radiation options.  # Metastatic squamous cell lung cancer status post multiple lines of therapy- most recently on Abraxane single agent currently status post cycle #1.  Poor tolerance to chemotherapy.  Will likely need dose reduced/alternate chemo option  # UTI-on culture on antibiotics. Discussed with Dr. Margaretmary Eddy.   Cammie Sickle, MD 03/24/2017  5:13 PM

## 2017-04-07 ENCOUNTER — Telehealth: Payer: Self-pay | Admitting: Internal Medicine

## 2017-04-07 NOTE — Telephone Encounter (Signed)
msg sent to sch. Team to arrange for additional apts below per md order

## 2017-04-07 NOTE — Telephone Encounter (Signed)
Spoke to Dr.Crystal re: need for external beam RT for mediastinal adenopathy/causing extrinsic compression.   Please make a referral to Dr. Donella Stade.   [facility] the patient follow-up with me in approximately 2-3 weeks/lab-cbc/cmp; no infusion.

## 2017-04-08 NOTE — Telephone Encounter (Signed)
x

## 2017-04-21 ENCOUNTER — Other Ambulatory Visit: Payer: Self-pay | Admitting: *Deleted

## 2017-04-21 ENCOUNTER — Other Ambulatory Visit: Payer: Self-pay

## 2017-04-21 ENCOUNTER — Ambulatory Visit
Admission: RE | Admit: 2017-04-21 | Discharge: 2017-04-21 | Disposition: A | Discharge status: Home / Self Care | Payer: Medicare Other | Source: Ambulatory Visit | Attending: Radiation Oncology | Admitting: Radiation Oncology

## 2017-04-21 ENCOUNTER — Encounter: Payer: Self-pay | Admitting: Radiation Oncology

## 2017-04-21 VITALS — BP 151/92 | HR 89 | Temp 96.4°F | Wt 112.7 lb

## 2017-04-21 DIAGNOSIS — Z79899 Other long term (current) drug therapy: Secondary | ICD-10-CM | POA: Diagnosis not present

## 2017-04-21 DIAGNOSIS — R1310 Dysphagia, unspecified: Secondary | ICD-10-CM | POA: Insufficient documentation

## 2017-04-21 DIAGNOSIS — J439 Emphysema, unspecified: Secondary | ICD-10-CM | POA: Diagnosis not present

## 2017-04-21 DIAGNOSIS — F17210 Nicotine dependence, cigarettes, uncomplicated: Secondary | ICD-10-CM | POA: Diagnosis not present

## 2017-04-21 DIAGNOSIS — C3411 Malignant neoplasm of upper lobe, right bronchus or lung: Secondary | ICD-10-CM

## 2017-04-21 DIAGNOSIS — B182 Chronic viral hepatitis C: Secondary | ICD-10-CM | POA: Insufficient documentation

## 2017-04-21 DIAGNOSIS — F329 Major depressive disorder, single episode, unspecified: Secondary | ICD-10-CM | POA: Insufficient documentation

## 2017-04-21 DIAGNOSIS — M25569 Pain in unspecified knee: Secondary | ICD-10-CM | POA: Diagnosis not present

## 2017-04-21 DIAGNOSIS — Z9221 Personal history of antineoplastic chemotherapy: Secondary | ICD-10-CM | POA: Insufficient documentation

## 2017-04-21 DIAGNOSIS — R05 Cough: Secondary | ICD-10-CM | POA: Diagnosis not present

## 2017-04-21 DIAGNOSIS — M549 Dorsalgia, unspecified: Secondary | ICD-10-CM | POA: Insufficient documentation

## 2017-04-21 DIAGNOSIS — R0602 Shortness of breath: Secondary | ICD-10-CM | POA: Insufficient documentation

## 2017-04-21 DIAGNOSIS — I10 Essential (primary) hypertension: Secondary | ICD-10-CM | POA: Insufficient documentation

## 2017-04-21 DIAGNOSIS — R011 Cardiac murmur, unspecified: Secondary | ICD-10-CM | POA: Insufficient documentation

## 2017-04-21 DIAGNOSIS — Z510 Encounter for antineoplastic radiation therapy: Secondary | ICD-10-CM | POA: Diagnosis not present

## 2017-04-21 DIAGNOSIS — G8929 Other chronic pain: Secondary | ICD-10-CM | POA: Diagnosis not present

## 2017-04-21 DIAGNOSIS — Z923 Personal history of irradiation: Secondary | ICD-10-CM | POA: Insufficient documentation

## 2017-04-21 DIAGNOSIS — M25519 Pain in unspecified shoulder: Secondary | ICD-10-CM | POA: Diagnosis not present

## 2017-04-21 NOTE — Consult Note (Signed)
NEW PATIENT EVALUATION  Name: Jeanette Jones  MRN: [id number]  Date:   04/21/2017     DOB: Apr 17, 1956   This 61 y.o. female patient presents to the clinic for initial evaluation of salvage radiation therapy to her right chest for stage IV lung cancer with progression in her right upper lobe.  REFERRING PHYSICIAN: [facility], [place]:  Chief Complaint  Patient presents with  . Cancer    Initial evaluation    DIAGNOSIS: There were no encounter diagnoses.   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 We have been following her chest over the past several years for progression in the right upper lobe. She has developed some dysphasia with barium swallow and CT scans demonstrate some extrinsic compression on her esophagus from progressive disease in her right upper lobe. She is currently on or just finished single agent Abraxane. At this time I been asked to evaluate her for possible palliative radiation therapy to the area of right upper lobe which is hypermetabolic on most recent PET CT scan back in September 2018. She states today the swallowing has improved after her recent hospitalization. She specifically denies cough hemoptysis or chest tightness.  PLANNED TREATMENT REGIMEN: Salvage radiation therapy  PAST MEDICAL HISTORY:  has a past medical history of Back pain, chronic, Carcinoma of lung ([place]) (07/2011), Cough, Depression, Dyspnea, Emphysema of lung ([place]), H/O tubal ligation (02/12/2017), Heart murmur, Hep C w/o coma, chronic ([place]), Hypertension, Knee pain (02/12/2017), Lung cancer ([place]), Shoulder pain (02/09/2017), and Swallowing difficulty.    PAST SURGICAL HISTORY:  Past Surgical History:  Procedure Laterality Date  .  ESOPHAGOGASTRODUODENOSCOPY N/A 06/08/2015   Procedure: ESOPHAGOGASTRODUODENOSCOPY (EGD);  Surgeon: Hulen Luster, MD;  Location: [facility] ENDOSCOPY;  Service: Gastroenterology;  Laterality: N/A;  . HEMORROIDECTOMY    . LUNG BIOPSY    . TUBAL LIGATION      FAMILY HISTORY: family history includes Hypertension in her other.  SOCIAL HISTORY:  reports that she has been smoking cigarettes.  She has a 10.00 pack-year smoking history. she has never used smokeless tobacco. She reports that she drinks alcohol. She reports that she uses drugs. Drugs: Cocaine and Marijuana.  ALLERGIES: Carboplatin  MEDICATIONS:  Current Outpatient Medications  Medication Sig Dispense Refill  . acetaminophen (TYLENOL) 160 MG/5ML solution Take 20.3 mLs (650 mg total) every 6 (six) hours as needed by mouth for mild pain or fever. 120 mL 0  . alum & mag hydroxide-simeth (MAALOX/MYLANTA) 200-200-20 MG/5ML suspension Take 15 mLs every 6 (six) hours as needed by mouth for indigestion or heartburn. 355 mL 0  . cyclobenzaprine (FLEXERIL) 10 MG tablet Take 10 mg by mouth at bedtime.    . feeding supplement, ENSURE COMPLETE, (ENSURE COMPLETE) LIQD Take 237 mLs by mouth 2 (two) times daily between meals. 60 Bottle 0  . feeding supplement, ENSURE ENLIVE, (ENSURE ENLIVE) LIQD Take 237 mLs 4 (four) times daily by mouth. 237 mL 12  . Fluticasone-Salmeterol (ADVAIR DISKUS) 500-50 MCG/DOSE AEPB Inhale 1 puff into the lungs 2 (two) times daily. 1 each 3  . Multiple Vitamin (MULTIVITAMIN) LIQD Take 15 mLs daily by mouth. 500 mL 1  . ondansetron (ZOFRAN) 8 MG tablet Take 1 tablet (8 mg total) by mouth every 8 (eight) hours as needed for nausea or  vomiting (start 3 days; after chemo). 40 tablet 1  . prochlorperazine (COMPAZINE) 10 MG tablet Take 1 tablet (10 mg total) by mouth every 6 (six) hours as needed for nausea or vomiting. 40 tablet 1   No current facility-administered medications for this encounter.     ECOG PERFORMANCE STATUS:  1 -  Symptomatic but completely ambulatory  REVIEW OF SYSTEMS:  Patient denies any weight loss, fatigue, weakness, fever, chills or night sweats. Patient denies any loss of vision, blurred vision. Patient denies any ringing  of the ears or hearing loss. No irregular heartbeat. Patient denies heart murmur or history of fainting. Patient denies any chest pain or pain radiating to her upper extremities. Patient denies any shortness of breath, difficulty breathing at night, cough or hemoptysis. Patient denies any swelling in the lower legs. Patient denies any nausea vomiting, vomiting of blood, or coffee ground material in the vomitus. Patient denies any stomach pain. Patient states has had normal bowel movements no significant constipation or diarrhea. Patient denies any dysuria, hematuria or significant nocturia. Patient denies any problems walking, swelling in the joints or loss of balance. Patient denies any skin changes, loss of hair or loss of weight. Patient denies any excessive worrying or anxiety or significant depression. Patient denies any problems with insomnia. Patient denies excessive thirst, polyuria, polydipsia. Patient denies any swollen glands, patient denies easy bruising or easy bleeding. Patient denies any recent infections, allergies or URI. Patient "s visual fields have not changed significantly in recent time.    PHYSICAL EXAM: BP (!) 151/92   Pulse 89   Temp (!) 96.4 F (35.8 C)   Wt 112 lb 10.5 oz (51.1 kg)   BMI 19.34 kg/m  Well-developed well-nourished patient in NAD. HEENT reveals PERLA, EOMI, discs not visualized.  Oral cavity is clear. No oral mucosal lesions are identified. Neck is clear without evidence of cervical or supraclavicular adenopathy. Lungs are clear to A&P. Cardiac examination is essentially unremarkable with regular rate and rhythm without murmur rub or thrill. Abdomen is benign with no organomegaly or masses noted. Motor sensory and DTR levels are equal and  symmetric in the upper and lower extremities. Cranial nerves II through XII are grossly intact. Proprioception is intact. No peripheral adenopathy or edema is identified. No motor or sensory levels are noted. Crude visual fields are within normal range.  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 I believe I can deliver another 4000 cGy over 4 weeks to the area of progressive disease in right upper lobe which is hypermetabolic on PET CT scan. Risks and benefits of salvage treatment including marked volume loss of her lung cough fatigue dysphasia secondary radiation esophagitis and alteration of blood counts and skin reaction all were discussed in detail with the patient. I personally set up and ordered CT simulation for next week. Will discuss the case with medical oncology for possibility of low dose concurrent chemotherapy with my salvage radiation therapy. Patient seems to comprehend my treatment plan well.  I would like to take this opportunity to thank you for allowing me to participate in the care of your patient.Armstead Peaks., MD

## 2017-04-23 ENCOUNTER — Inpatient Hospital Stay: Payer: Medicare Other | Attending: Internal Medicine | Admitting: Internal Medicine

## 2017-04-23 ENCOUNTER — Encounter: Payer: Self-pay | Admitting: Internal Medicine

## 2017-04-23 ENCOUNTER — Inpatient Hospital Stay: Payer: Medicare Other

## 2017-04-23 ENCOUNTER — Other Ambulatory Visit: Payer: Self-pay

## 2017-04-23 VITALS — BP 125/76 | HR 106 | Temp 97.5°F | Resp 20 | Ht 63.0 in | Wt 109.8 lb

## 2017-04-23 DIAGNOSIS — F17210 Nicotine dependence, cigarettes, uncomplicated: Secondary | ICD-10-CM | POA: Diagnosis not present

## 2017-04-23 DIAGNOSIS — J449 Chronic obstructive pulmonary disease, unspecified: Secondary | ICD-10-CM | POA: Insufficient documentation

## 2017-04-23 DIAGNOSIS — C3431 Malignant neoplasm of lower lobe, right bronchus or lung: Secondary | ICD-10-CM

## 2017-04-23 DIAGNOSIS — I10 Essential (primary) hypertension: Secondary | ICD-10-CM | POA: Diagnosis not present

## 2017-04-23 DIAGNOSIS — Z923 Personal history of irradiation: Secondary | ICD-10-CM

## 2017-04-23 DIAGNOSIS — C770 Secondary and unspecified malignant neoplasm of lymph nodes of head, face and neck: Secondary | ICD-10-CM | POA: Diagnosis not present

## 2017-04-23 DIAGNOSIS — R634 Abnormal weight loss: Secondary | ICD-10-CM

## 2017-04-23 DIAGNOSIS — C7801 Secondary malignant neoplasm of right lung: Secondary | ICD-10-CM | POA: Diagnosis not present

## 2017-04-23 DIAGNOSIS — Z79899 Other long term (current) drug therapy: Secondary | ICD-10-CM | POA: Diagnosis not present

## 2017-04-23 DIAGNOSIS — F329 Major depressive disorder, single episode, unspecified: Secondary | ICD-10-CM | POA: Insufficient documentation

## 2017-04-23 DIAGNOSIS — B182 Chronic viral hepatitis C: Secondary | ICD-10-CM | POA: Insufficient documentation

## 2017-04-23 LAB — COMPREHENSIVE METABOLIC PANEL
ALBUMIN: 3.5 g/dL (ref 3.5–5.0)
ALK PHOS: 116 U/L (ref 38–126)
ALT: 31 U/L (ref 14–54)
AST: 58 U/L — AB (ref 15–41)
Anion gap: 11 (ref 5–15)
BUN: 8 mg/dL (ref 6–20)
CALCIUM: 9.2 mg/dL (ref 8.9–10.3)
CHLORIDE: 109 mmol/L (ref 101–111)
CO2: 20 mmol/L — AB (ref 22–32)
CREATININE: 0.59 mg/dL (ref 0.44–1.00)
GFR calc non Af Amer: >60 mL/min (ref >60)
GLUCOSE: 93 mg/dL (ref 65–99)
Potassium: 3.7 mmol/L (ref 3.5–5.1)
SODIUM: 140 mmol/L (ref 135–145)
Total Bilirubin: 0.4 mg/dL (ref 0.3–1.2)
Total Protein: 8.9 g/dL — ABNORMAL HIGH (ref 6.5–8.1)

## 2017-04-23 LAB — CBC WITH DIFFERENTIAL/PLATELET
BASOS PCT: 2 %
Basophils Absolute: 0.1 10*3/uL (ref 0–0.1)
EOS ABS: 0.1 10*3/uL (ref 0–0.7)
Eosinophils Relative: 2 %
HEMATOCRIT: 33.2 % — AB (ref 35.0–47.0)
HEMOGLOBIN: 11.1 g/dL — AB (ref 12.0–16.0)
LYMPHS ABS: 1.7 10*3/uL (ref 1.0–3.6)
Lymphocytes Relative: 43 %
MCH: 29.6 pg (ref 26.0–34.0)
MCHC: 33.3 g/dL (ref 32.0–36.0)
MCV: 88.6 fL (ref 80.0–100.0)
MONO ABS: 0.5 10*3/uL (ref 0.2–0.9)
MONOS PCT: 13 %
NEUTROS PCT: 40 %
Neutro Abs: 1.5 10*3/uL (ref 1.4–6.5)
Platelets: 304 10*3/uL (ref 150–440)
RBC: 3.74 MIL/uL — ABNORMAL LOW (ref 3.80–5.20)
RDW: 19.7 % — AB (ref 11.5–14.5)
WBC: 3.8 10*3/uL (ref 3.6–11.0)

## 2017-04-23 MED ORDER — HYDROCODONE-ACETAMINOPHEN 5-325 MG PO TABS: 1.0000 | ORAL_TABLET | Freq: Three times a day (TID) | ORAL | 0 refills | Status: DC | PRN

## 2017-04-23 NOTE — Progress Notes (Signed)
[place] OFFICE PROGRESS NOTE  Patient Care Team: [facility], [facility] as PCP - General (General Practice)  Cancer Staging No matching staging information was found for the patient.   Oncology History   Chief Complaint/Diagnosis:   1. 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 Faith Rogue, cardiothoracic surgeon) 2. Started on radiation and concurrent chemotherapy with carboplatinum Taxol from April of 2013 3. Finished radiation and chemotherapy with carboplatinum and Taxol in May of 2013 4. Stage IIIB (T4N3M0) NSCLC who completed a course of EBRT on Oct 07, 2011 with a total dose of 7000cGy.   5. Starting consolidation with 2 cycles of carboplatinum and Abraxane 6.patient is off chemotherapy since November of 2013.  Repeat CT scan shows stable disease(January, 2014) 7  Started been on Taxotere and  RAMICIRUMAB from June 07, 2013 8.chemotherapy with Taxotere  and  RAMUCIRUMAB discontinued in March of 2015 because of hemoptysis.  9.Started onNIVOLULAMAB April of 2015. 10NIVOLULAMABWas put on hold because of abnormal liver enzymes January of 2016  # AUG 2017- Right lung recurrence [2 nodules - ~1cm; stable lung mass/fibrosis]. START OPDIVO q 2W [small to Bx; s/p rad-onc eval]; opdivo x4- CT OCT 16th- PROGRESSION 2 lung nodules ~58mm;   # JAN 2018-carbo-TAXOL s/p 2 cycles- CT- PR; but carbo-anaphylaxis; STOPPED chemo  # July 2018- PROGRESSION- GEMCITABINE; 02/12/2017- PROGRESSION [? LFTs elevated sec to Gem; Gem Discontinued]  # MID OCT 2018- Abraxane   # MOLECULAR TESTING- ? Not enough tissue; Oct 2018- right supraclav Bx- for molecular testing.      Primary cancer of bronchus of right lower lobe (HCC)     INTERVAL HISTORY:  Jeanette Jones 61 y.o.  female pleasant patient above history of stage IV lung cancer squamous cell/ recurrent- currently on  Abraxane is here for follow-up.   Chemotherapy last approximately 1 month ago.  Patient was admitted to the hospital for nausea vomiting diarrhea dehydration presyncope.  Thought to be from chemotherapy.  Patient given the difficulty swallowing had a barium esophagogram-that was suggestive of extrinsic compression of the esophagus by the mediastinal lymph nodes.  She was evaluated by GI.  Currently she complains of worsening pain in the right shoulder/right neck.   Patient denies any tingling and numbness of the extremities. She denies any skin rash. No diarrhea.  Continues to have chronic shortness of breath chronic cough.  No hemoptysis.  No chest pain.   REVIEW OF SYSTEMS:  A complete 10 point review of system is done which is negative except mentioned above/history of present illness.   PAST MEDICAL HISTORY :  Past Medical History:  Diagnosis Date  . Back pain, chronic   . Carcinoma of lung ([place]) 07/2011  . Cough   . Depression   . Dyspnea   . Emphysema of lung ([place])   . H/O tubal ligation 02/12/2017   From chart  . Heart murmur   . Hep C w/o coma, chronic ([place])   . Hypertension   . Knee pain 02/12/2017  . Lung cancer ([place])   . Shoulder pain 02/09/2017  . Swallowing difficulty     PAST SURGICAL HISTORY :   Past Surgical History:  Procedure Laterality Date  . ESOPHAGOGASTRODUODENOSCOPY N/A 06/08/2015   Procedure: ESOPHAGOGASTRODUODENOSCOPY (EGD);  Surgeon: Hulen Luster, MD;  Location: [facility] ENDOSCOPY;  Service: Gastroenterology;  Laterality: N/A;  . HEMORROIDECTOMY    . LUNG BIOPSY    .  TUBAL LIGATION      FAMILY HISTORY :   Family History  Problem Relation Age of Onset  . Hypertension Other   . Breast cancer Neg Hx     SOCIAL HISTORY:   Social History   Tobacco Use  . Smoking status: Current Every Day Smoker    Packs/day: 0.25    Years: 40.00    Pack years: 10.00    Types: Cigarettes  . Smokeless tobacco: Never Used  Substance Use Topics  . Alcohol use: Yes     Comment: beer on saturday  . Drug use: Yes    Types: Cocaine, Marijuana    Comment: 2-3 days ago; dr penwarden notified    ALLERGIES:  is allergic to carboplatin.  MEDICATIONS:  Current Outpatient Medications  Medication Sig Dispense Refill  . acetaminophen (TYLENOL) 160 MG/5ML solution Take 20.3 mLs (650 mg total) every 6 (six) hours as needed by mouth for mild pain or fever. 120 mL 0  . alum & mag hydroxide-simeth (MAALOX/MYLANTA) 200-200-20 MG/5ML suspension Take 15 mLs every 6 (six) hours as needed by mouth for indigestion or heartburn. 355 mL 0  . feeding supplement, ENSURE COMPLETE, (ENSURE COMPLETE) LIQD Take 237 mLs by mouth 2 (two) times daily between meals. 60 Bottle 0  . feeding supplement, ENSURE ENLIVE, (ENSURE ENLIVE) LIQD Take 237 mLs 4 (four) times daily by mouth. 237 mL 12  . Fluticasone-Salmeterol (ADVAIR DISKUS) 500-50 MCG/DOSE AEPB Inhale 1 puff into the lungs 2 (two) times daily. 1 each 3  . Multiple Vitamin (MULTIVITAMIN) LIQD Take 15 mLs daily by mouth. 500 mL 1  . ondansetron (ZOFRAN) 8 MG tablet Take 1 tablet (8 mg total) by mouth every 8 (eight) hours as needed for nausea or vomiting (start 3 days; after chemo). 40 tablet 1  . prochlorperazine (COMPAZINE) 10 MG tablet Take 1 tablet (10 mg total) by mouth every 6 (six) hours as needed for nausea or vomiting. 40 tablet 1  . cyclobenzaprine (FLEXERIL) 10 MG tablet Take 10 mg by mouth at bedtime.    Marland Kitchen HYDROcodone-acetaminophen (NORCO/VICODIN) 5-325 MG tablet Take 1 tablet by mouth every 8 (eight) hours as needed for moderate pain. 60 tablet 0   No current facility-administered medications for this visit.     PHYSICAL EXAMINATION: ECOG PERFORMANCE STATUS: 0 - Asymptomatic  BP 125/76 (BP Location: Right Arm, Patient Position: Sitting)   Pulse (!) 106   Temp (!) 97.5 F (36.4 C) (Tympanic)   Resp 20   Ht 5\' 3"  (1.6 m)   Wt 109 lb 12.8 oz (49.8 kg)   BMI 19.45 kg/m   Filed Weights   04/23/17 1118  Weight:  109 lb 12.8 oz (49.8 kg)    GENERAL: Moderately nourished thin build.  Alert, no distress and comfortable.   Alone.  EYES: no pallor or icterus OROPHARYNX: no thrush or ulceration; good dentition  NECK: supple, no masses felt LYMPH:  no palpable lymphadenopathy in the cervical, axillary or inguinal regions LUNGS: Decreased air entry to auscultation bilaterally and  No wheeze or crackles HEART/CVS: regular rate & rhythm and no murmurs; No lower extremity edema ABDOMEN:abdomen soft, non-tender and normal bowel sounds Musculoskeletal:no cyanosis of digits and no clubbing  PSYCH: alert & oriented x 3 with fluent speech NEURO: no focal motor/sensory deficits SKIN:  no rashes or significant lesions  LABORATORY DATA:  I have reviewed the data as listed    Component Value Date/Time   NA 140 04/23/2017 1042   NA 135 08/29/2014  1457   K 3.7 04/23/2017 1042   K 3.7 08/29/2014 1457   CL 109 04/23/2017 1042   CL 105 08/29/2014 1457   CO2 20 (L) 04/23/2017 1042   CO2 24 08/29/2014 1457   GLUCOSE 93 04/23/2017 1042   GLUCOSE 98 08/29/2014 1457   BUN 8 04/23/2017 1042   BUN 8 08/29/2014 1457   CREATININE 0.59 04/23/2017 1042   CREATININE 0.64 08/29/2014 1457   CALCIUM 9.2 04/23/2017 1042   CALCIUM 9.0 08/29/2014 1457   PROT 8.9 (H) 04/23/2017 1042   PROT 9.3 (H) 08/29/2014 1457   ALBUMIN 3.5 04/23/2017 1042   ALBUMIN 3.6 08/29/2014 1457   AST 58 (H) 04/23/2017 1042   AST 111 (H) 08/29/2014 1457   ALT 31 04/23/2017 1042   ALT 78 (H) 08/29/2014 1457   ALKPHOS 116 04/23/2017 1042   ALKPHOS 168 (H) 08/29/2014 1457   BILITOT 0.4 04/23/2017 1042   BILITOT 0.6 08/29/2014 1457   GFRNONAA >60 04/23/2017 1042   GFRNONAA >60 08/29/2014 1457   GFRAA >60 04/23/2017 1042   GFRAA >60 08/29/2014 1457    No results found for: SPEP, UPEP  Lab Results  Component Value Date   WBC 3.8 04/23/2017   NEUTROABS 1.5 04/23/2017   HGB 11.1 (L) 04/23/2017   HCT 33.2 (L) 04/23/2017   MCV 88.6  04/23/2017   PLT 304 04/23/2017      Chemistry      Component Value Date/Time   NA 140 04/23/2017 1042   NA 135 08/29/2014 1457   K 3.7 04/23/2017 1042   K 3.7 08/29/2014 1457   CL 109 04/23/2017 1042   CL 105 08/29/2014 1457   CO2 20 (L) 04/23/2017 1042   CO2 24 08/29/2014 1457   BUN 8 04/23/2017 1042   BUN 8 08/29/2014 1457   CREATININE 0.59 04/23/2017 1042   CREATININE 0.64 08/29/2014 1457      Component Value Date/Time   CALCIUM 9.2 04/23/2017 1042   CALCIUM 9.0 08/29/2014 1457   ALKPHOS 116 04/23/2017 1042   ALKPHOS 168 (H) 08/29/2014 1457   AST 58 (H) 04/23/2017 1042   AST 111 (H) 08/29/2014 1457   ALT 31 04/23/2017 1042   ALT 78 (H) 08/29/2014 1457   BILITOT 0.4 04/23/2017 1042   BILITOT 0.6 08/29/2014 1457     IMPRESSION: 1. 6 cm hypermetabolic mass anteriorly in the primarily collapsed right upper lobe, maximum SUV 13.8. Associated paratracheal, subcarinal, and supraclavicular metastatic adenopathy. 2. The masslike density in the right lower lobe has low-grade metabolic activity, potentially from prior radiation therapy. 3. 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 4. No findings of metastatic disease to the abdomen or pelvis.   Electronically Signed   By: Van Clines M.D.   On: 02/12/2017 12:31    RADIOGRAPHIC STUDIES: I have personally reviewed the radiological images as listed and agreed with the findings in the report. No results found.   ASSESSMENT & PLAN:  Primary cancer of bronchus of right lower lobe ([place]) # Right lung ca- with recurrence [based on imaging]- metastases to right lung ~10-44mm;  OCT 5th PET- progression of the right upper lobe primary; right supraclav LN.   I would recommend biopsy of the right supraclavicular lymph node- for more tissue for molecular testing. Patient is  agreeable.  #  Proceed with palliative RT to mediastinal LN. patient had poor tolerance to  Abraxane chemotherapy; we will hold for now.  # weight loss- improving will check re: proetin shakes.    # Pain right shoulder/ arm- ? Sec to malignancy- recommend hydrocodone 1 every 8 hours.   # COPD/bronchitis-STABLE. On advair/ albuterol.   # follow up in 6 weeks/labs/MD.   Orders Placed This Encounter  Procedures  . CBC with Differential    Standing Status:   Future    Standing Expiration Date:   04/23/2018  . Comprehensive metabolic panel    Standing Status:   Future    Standing Expiration Date:   04/23/2018      Cammie Sickle, MD 04/23/2017 12:46 PM

## 2017-04-23 NOTE — Assessment & Plan Note (Addendum)
#   Right lung ca- with recurrence [based on imaging]- metastases to right lung ~10-79mm;  OCT 5th PET- progression of the right upper lobe primary; right supraclav LN.   I would recommend biopsy of the right supraclavicular lymph node- for more tissue for molecular testing. Patient is agreeable.  #  Proceed with palliative RT to mediastinal LN. patient had poor tolerance to Abraxane chemotherapy; we will hold for now.  # weight loss- improving will check re: proetin shakes.    # Pain right shoulder/ arm- ? Sec to malignancy- recommend hydrocodone 1 every 8 hours.   # COPD/bronchitis-STABLE. On advair/ albuterol.   # follow up in 6 weeks/labs/MD.

## 2017-04-27 ENCOUNTER — Other Ambulatory Visit: Payer: Self-pay | Admitting: Internal Medicine

## 2017-04-27 DIAGNOSIS — C3431 Malignant neoplasm of lower lobe, right bronchus or lung: Secondary | ICD-10-CM

## 2017-04-27 NOTE — Progress Notes (Signed)
Received phone call from [facility] in specialty scheduling. IR provider would like to cnl u/s lymph node bx and order ct guided bx of lung for tissue sampling. Spoke with Dr. Annabell Sabal agreed to change order. Will fax sch. Worksheet to [employer]. scheduling team.

## 2017-04-28 ENCOUNTER — Ambulatory Visit: Payer: Medicare Other

## 2017-04-30 ENCOUNTER — Other Ambulatory Visit: Payer: Self-pay | Admitting: *Deleted

## 2017-04-30 ENCOUNTER — Ambulatory Visit
Admission: RE | Admit: 2017-04-30 | Discharge: 2017-04-30 | Disposition: A | Discharge status: Home / Self Care | Payer: Medicare Other | Source: Ambulatory Visit | Attending: Radiation Oncology | Admitting: Radiation Oncology

## 2017-04-30 DIAGNOSIS — C3411 Malignant neoplasm of upper lobe, right bronchus or lung: Secondary | ICD-10-CM | POA: Diagnosis not present

## 2017-05-03 ENCOUNTER — Telehealth: Payer: Self-pay | Admitting: *Deleted

## 2017-05-03 ENCOUNTER — Other Ambulatory Visit: Payer: Self-pay | Admitting: Radiology

## 2017-05-03 DIAGNOSIS — C3411 Malignant neoplasm of upper lobe, right bronchus or lung: Secondary | ICD-10-CM | POA: Diagnosis not present

## 2017-05-03 NOTE — Telephone Encounter (Signed)
Received phone call from [facility] in special procedures. Patient c/o head cold and cough. Ct guided biopsy will be r/s/postponed until next week. Pt was offered multiple apt options but pt declined and specifically requested to have lung biopsy post poned until after Christmas.  Pt states that her grandchildren have been sick with upper respiratory infections. She states that she will use over the counter mucinex if needed for cold like symptoms. She will let us know at her Thursday radiation apt if she is feeling any worse. An apt with the NP was offered for tomorrow, but pt declined the apt at this time.

## 2017-05-04 ENCOUNTER — Ambulatory Visit: Admission: RE | Admit: 2017-05-04 | Payer: Medicare Other | Source: Ambulatory Visit

## 2017-05-06 ENCOUNTER — Ambulatory Visit
Admission: RE | Admit: 2017-05-06 | Discharge: 2017-05-06 | Disposition: A | Discharge status: Home / Self Care | Payer: Medicare Other | Source: Ambulatory Visit | Attending: Radiation Oncology | Admitting: Radiation Oncology

## 2017-05-12 ENCOUNTER — Telehealth: Payer: Self-pay | Admitting: *Deleted

## 2017-05-12 ENCOUNTER — Other Ambulatory Visit: Payer: Self-pay | Admitting: Radiology

## 2017-05-12 ENCOUNTER — Ambulatory Visit: Payer: Medicare Other

## 2017-05-12 MED ORDER — LEVOFLOXACIN 500 MG PO TABS: 500.0000 mg | ORAL_TABLET | Freq: Every day | ORAL | 0 refills | Status: DC

## 2017-05-12 NOTE — Telephone Encounter (Signed)
Patient reports that she is "full of cold" and does not think she can do the biopsy tomorrow.  Discussed with Dr B and orders received to start her on levaquin 500 mg daily times 7 and to reschedule her biopsy until next week . Patient advised of Dr B orders and repeated back to me Phone call to [place] and Nira Conn regarding this. Will send this to scheduling to reschedule her biopsy

## 2017-05-13 ENCOUNTER — Ambulatory Visit: Payer: Medicare Other

## 2017-05-14 ENCOUNTER — Ambulatory Visit: Payer: Medicare Other

## 2017-05-17 ENCOUNTER — Ambulatory Visit: Payer: Medicare Other

## 2017-05-17 ENCOUNTER — Telehealth: Payer: Self-pay | Admitting: *Deleted

## 2017-05-17 NOTE — Telephone Encounter (Signed)
Patient: Jeanette Jones. The new bx date is 05/25/17 and she should arrive at 730 am. Per scheduling [facility] cancer center unable to transport patient to apts due to mod. sedation.   Per [place] in Cancer center scheduling. "Called to tell pt about appt and she stated her son doesn't want her to do it she needs to talk to DR before it is done stated she is finishing meds given last week and she is sore all over and not well."

## 2017-05-19 ENCOUNTER — Ambulatory Visit: Payer: Medicare Other

## 2017-05-19 ENCOUNTER — Ambulatory Visit
Admission: RE | Admit: 2017-05-19 | Discharge: 2017-05-19 | Disposition: A | Discharge status: Home / Self Care | Payer: Medicare Other | Source: Ambulatory Visit | Attending: Radiation Oncology | Admitting: Radiation Oncology

## 2017-05-19 DIAGNOSIS — C3411 Malignant neoplasm of upper lobe, right bronchus or lung: Secondary | ICD-10-CM | POA: Diagnosis not present

## 2017-05-20 ENCOUNTER — Ambulatory Visit: Payer: Medicare Other

## 2017-05-20 ENCOUNTER — Ambulatory Visit
Admission: RE | Admit: 2017-05-20 | Discharge: 2017-05-20 | Disposition: A | Discharge status: Home / Self Care | Payer: Medicare Other | Source: Ambulatory Visit | Attending: Radiation Oncology | Admitting: Radiation Oncology

## 2017-05-20 DIAGNOSIS — C3411 Malignant neoplasm of upper lobe, right bronchus or lung: Secondary | ICD-10-CM | POA: Diagnosis not present

## 2017-05-21 ENCOUNTER — Ambulatory Visit: Payer: Medicare Other

## 2017-05-21 ENCOUNTER — Ambulatory Visit
Admission: RE | Admit: 2017-05-21 | Discharge: 2017-05-21 | Disposition: A | Discharge status: Home / Self Care | Payer: Medicare Other | Source: Ambulatory Visit | Attending: Radiation Oncology | Admitting: Radiation Oncology

## 2017-05-21 DIAGNOSIS — C3411 Malignant neoplasm of upper lobe, right bronchus or lung: Secondary | ICD-10-CM | POA: Diagnosis not present

## 2017-05-24 ENCOUNTER — Ambulatory Visit
Admission: RE | Admit: 2017-05-24 | Discharge: 2017-05-24 | Disposition: A | Discharge status: Home / Self Care | Payer: Medicare Other | Source: Ambulatory Visit | Attending: Radiation Oncology | Admitting: Radiation Oncology

## 2017-05-24 ENCOUNTER — Other Ambulatory Visit: Payer: Self-pay | Admitting: Physician Assistant

## 2017-05-24 ENCOUNTER — Ambulatory Visit: Payer: Medicare Other

## 2017-05-24 DIAGNOSIS — C3411 Malignant neoplasm of upper lobe, right bronchus or lung: Secondary | ICD-10-CM | POA: Diagnosis not present

## 2017-05-25 ENCOUNTER — Ambulatory Visit: Payer: Medicare Other

## 2017-05-25 ENCOUNTER — Ambulatory Visit
Admission: RE | Admit: 2017-05-25 | Discharge: 2017-05-25 | Disposition: A | Discharge status: Home / Self Care | Payer: Medicare Other | Source: Ambulatory Visit | Attending: Radiation Oncology | Admitting: Radiation Oncology

## 2017-05-25 DIAGNOSIS — C3411 Malignant neoplasm of upper lobe, right bronchus or lung: Secondary | ICD-10-CM | POA: Diagnosis not present

## 2017-05-26 ENCOUNTER — Ambulatory Visit
Admission: RE | Admit: 2017-05-26 | Discharge: 2017-05-26 | Disposition: A | Discharge status: Home / Self Care | Payer: Medicare Other | Source: Ambulatory Visit | Attending: Radiation Oncology | Admitting: Radiation Oncology

## 2017-05-26 ENCOUNTER — Ambulatory Visit: Payer: Medicare Other

## 2017-05-26 DIAGNOSIS — C3411 Malignant neoplasm of upper lobe, right bronchus or lung: Secondary | ICD-10-CM | POA: Diagnosis not present

## 2017-05-26 IMAGING — CT CT CHEST W/ CM
1 series · 15 of 34 positions shown, 19 images · IV contrast (iopamidol)
Comparison: 05/20/2016

CLINICAL DATA: Stage IIIA right lower lobe lung cancer.

EXAM:
CT CHEST WITH CONTRAST
TECHNIQUE: Multidetector CT imaging of the chest was performed during
intravenous contrast administration.
CONTRAST:  75mL RFZ4YY-EHH IOPAMIDOL (RFZ4YY-EHH) INJECTION 61%

[Series 2: axial st · axial · 0.64mm/px · z∈[-613,-383]mm · 15 of 137 slices shown, 19 images]
[im 11/137  mediastinal]
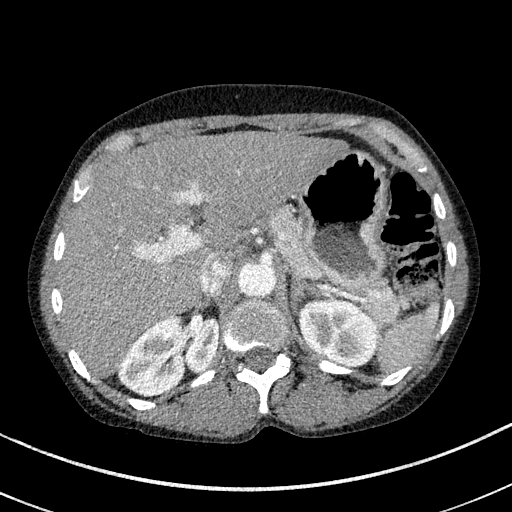
[im 11/137  lung]
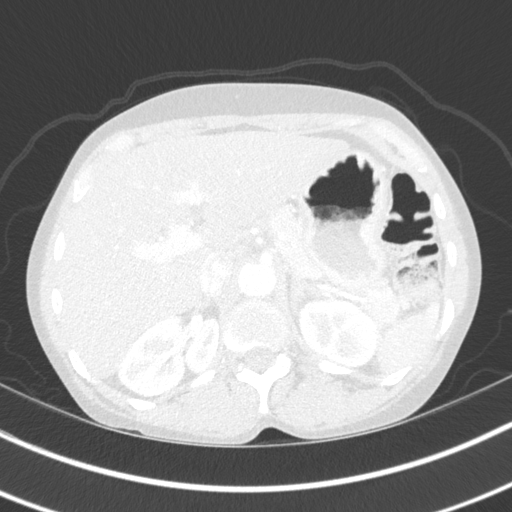
[im 21/137  lung]
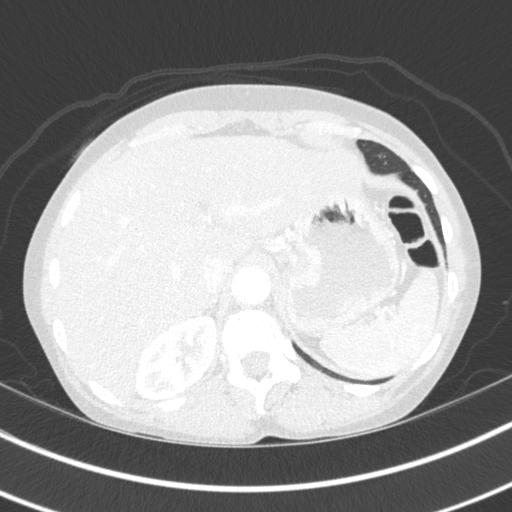
[im 28/137  lung]
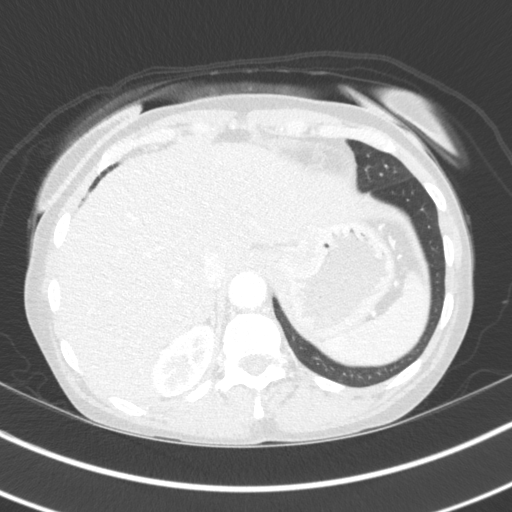
[im 36/137  lung]
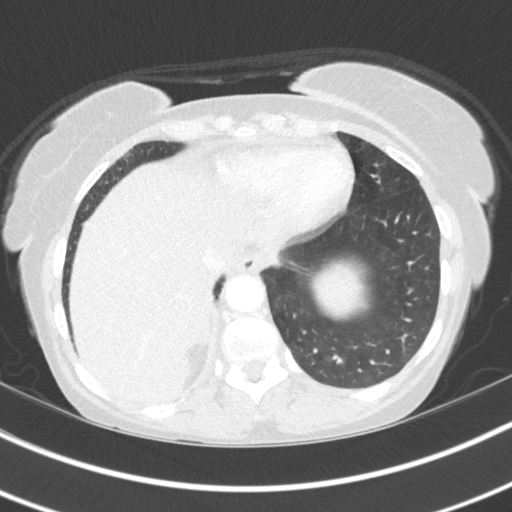
[im 46/137  mediastinal]
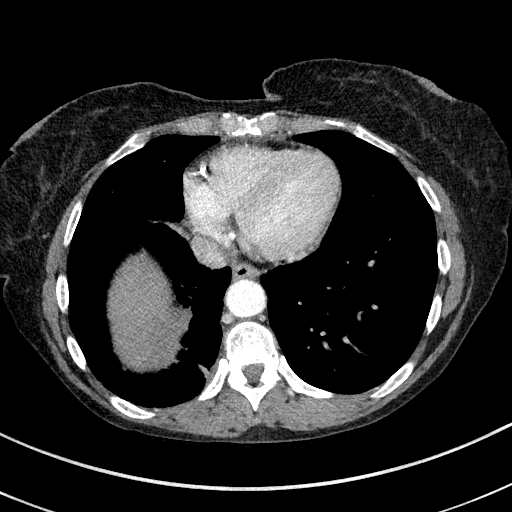
[im 46/137  lung]
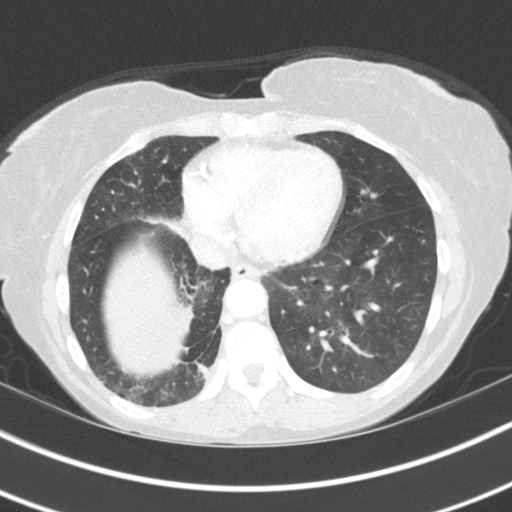
[im 55/137  lung]
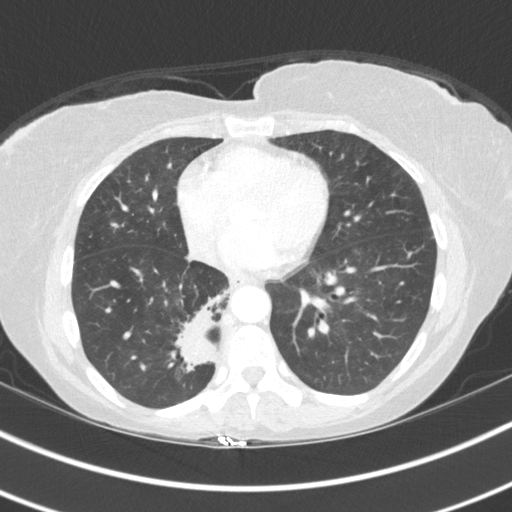
[im 61/137  lung]
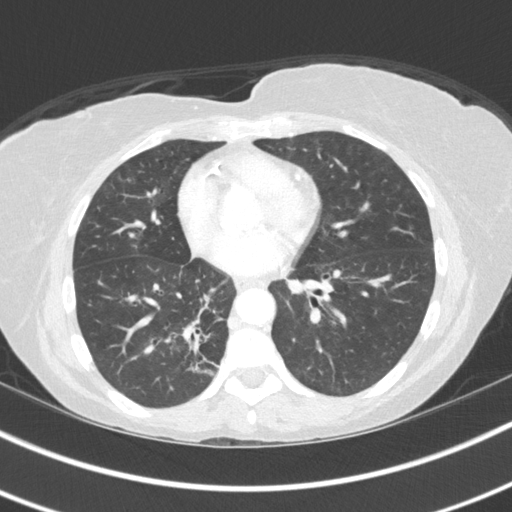
[im 71/137  lung]
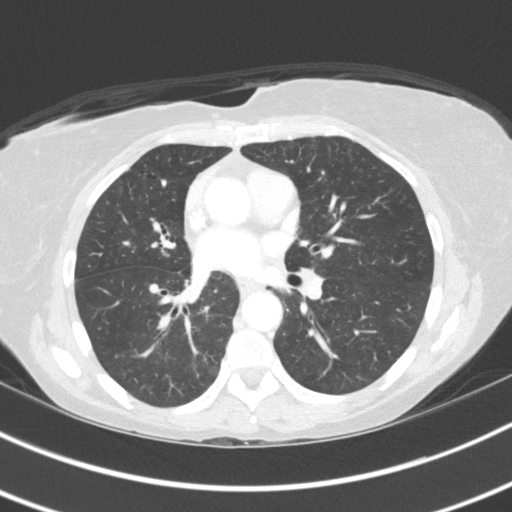
[im 76/137  mediastinal]
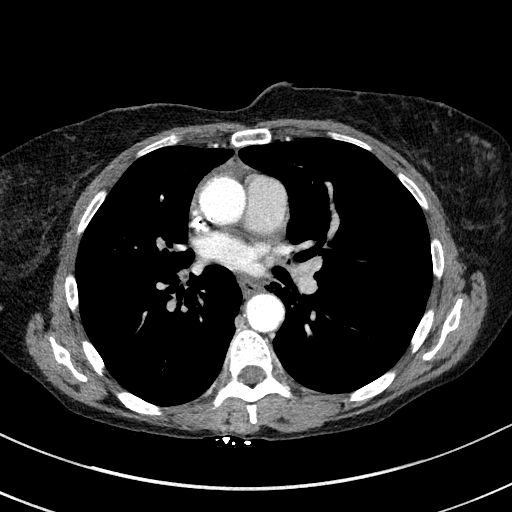
[im 76/137  lung]
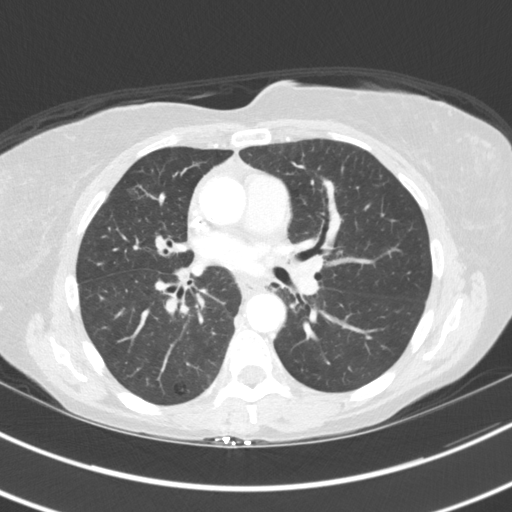
[im 82/137  lung]
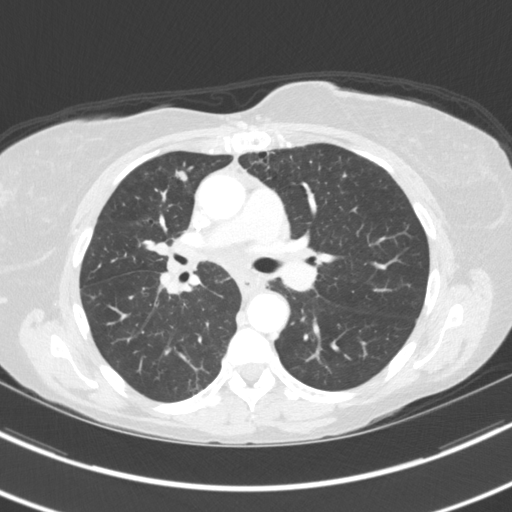
[im 91/137  lung]
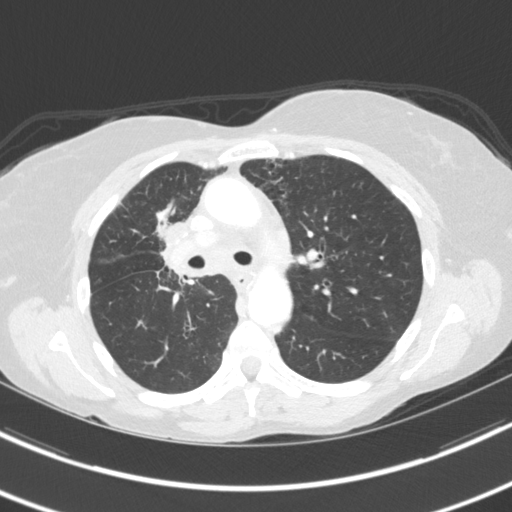
[im 101/137  lung]
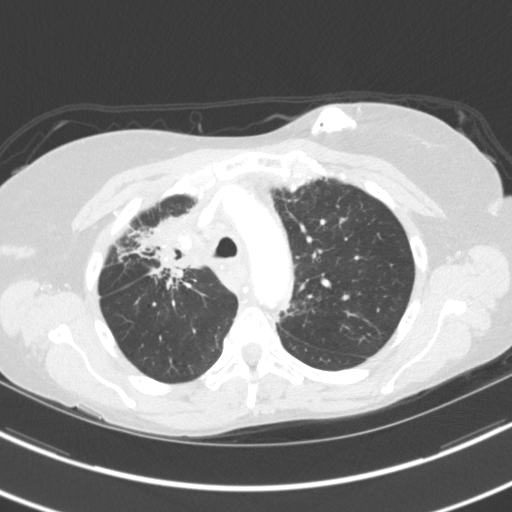
[im 109/137  mediastinal]
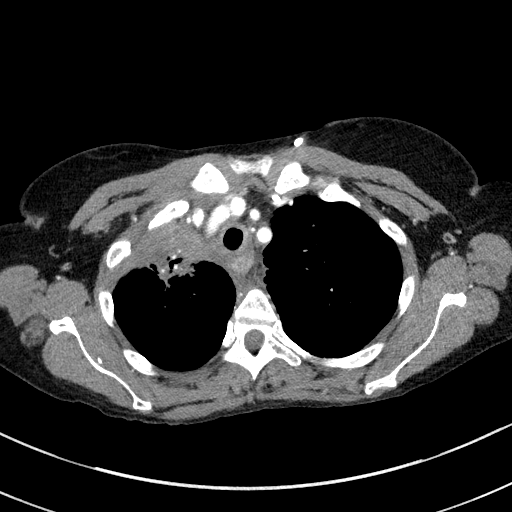
[im 109/137  lung]
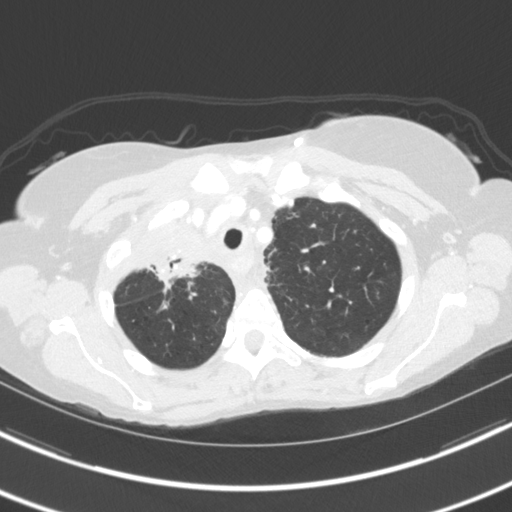
[im 116/137  lung]
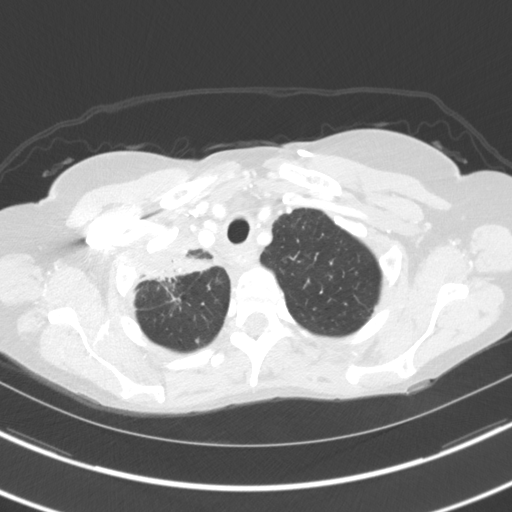
[im 126/137  lung]
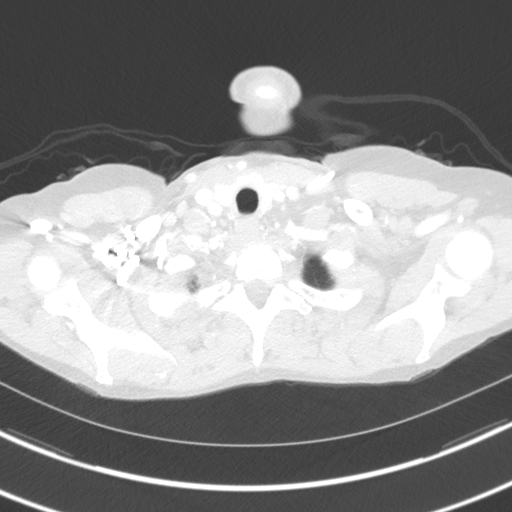

[15 of 34 positions shown; findings below may reference images not displayed]

FINDINGS: Cardiovascular: The heart size is normal. Trace pericardial
effusion. Coronary artery calcification is noted.

Mediastinum/Nodes: Stable 7 mm short axis right paratracheal lymph
node. Stable 8 mm short axis AP window lymph node. Abnormal soft
tissue attenuation in the right hilum , circumferentially encasing
the bronchus intermedius is unchanged.

Lungs/Pleura: Right apical pleural-parenchymal scarring is stable,
likely radiation induced. Anterior right lung nodule measured
lower lobe pulmonary nodule measured previously is now 8 x 6 mm.
Irregular scarring right lower lobe lesion measured previously at
4.2 x 3.1 cm is now 4.2 x 2.9 cm

Left lung stable with no new or progressive findings.

Upper Abdomen: No focal abnormality in the liver.

Musculoskeletal: Bone windows reveal no worrisome lytic or sclerotic
osseous lesions.
IMPRESSION: 1. Interval decrease in size of right upper lobe pulmonary nodule.
2. Previously described right lower lobe pulmonary nodule has
decreased in the interval.
3. Irregular lesion posterior right lower lobe described previously
as post radiation change is stable.
4. Right upper lobe fibrosis similar to prior.

## 2017-05-27 ENCOUNTER — Ambulatory Visit: Payer: Medicare Other

## 2017-05-27 ENCOUNTER — Ambulatory Visit
Admission: RE | Admit: 2017-05-27 | Discharge: 2017-05-27 | Disposition: A | Discharge status: Home / Self Care | Payer: Medicare Other | Source: Ambulatory Visit | Attending: Radiation Oncology | Admitting: Radiation Oncology

## 2017-05-27 DIAGNOSIS — C3411 Malignant neoplasm of upper lobe, right bronchus or lung: Secondary | ICD-10-CM | POA: Diagnosis not present

## 2017-05-28 ENCOUNTER — Ambulatory Visit: Payer: Medicare Other

## 2017-05-28 ENCOUNTER — Ambulatory Visit
Admission: RE | Admit: 2017-05-28 | Discharge: 2017-05-28 | Disposition: A | Discharge status: Home / Self Care | Payer: Medicare Other | Source: Ambulatory Visit | Attending: Radiation Oncology | Admitting: Radiation Oncology

## 2017-05-28 DIAGNOSIS — C3411 Malignant neoplasm of upper lobe, right bronchus or lung: Secondary | ICD-10-CM | POA: Diagnosis not present

## 2017-05-31 ENCOUNTER — Ambulatory Visit
Admission: RE | Admit: 2017-05-31 | Discharge: 2017-05-31 | Disposition: A | Discharge status: Home / Self Care | Payer: Medicare Other | Source: Ambulatory Visit | Attending: Radiation Oncology | Admitting: Radiation Oncology

## 2017-05-31 ENCOUNTER — Ambulatory Visit: Payer: Medicare Other

## 2017-05-31 DIAGNOSIS — C3411 Malignant neoplasm of upper lobe, right bronchus or lung: Secondary | ICD-10-CM | POA: Diagnosis not present

## 2017-06-01 ENCOUNTER — Ambulatory Visit: Payer: Medicare Other

## 2017-06-01 ENCOUNTER — Telehealth: Payer: Self-pay | Admitting: *Deleted

## 2017-06-01 ENCOUNTER — Ambulatory Visit
Admission: RE | Admit: 2017-06-01 | Discharge: 2017-06-01 | Disposition: A | Discharge status: Home / Self Care | Payer: Medicare Other | Source: Ambulatory Visit | Attending: Radiation Oncology | Admitting: Radiation Oncology

## 2017-06-01 DIAGNOSIS — C3411 Malignant neoplasm of upper lobe, right bronchus or lung: Secondary | ICD-10-CM | POA: Diagnosis not present

## 2017-06-01 DIAGNOSIS — R634 Abnormal weight loss: Secondary | ICD-10-CM

## 2017-06-01 DIAGNOSIS — C3490 Malignant neoplasm of unspecified part of unspecified bronchus or lung: Secondary | ICD-10-CM

## 2017-06-01 NOTE — Telephone Encounter (Signed)
Pt stopped me in the lobby to ask if could get samples of ensure to drink since she has experience some weight loss. Samples given to patient and also given an appt to see dietitan, Joli on 1/24 after radiation treatment. Pt verbalized understanding.

## 2017-06-01 NOTE — Telephone Encounter (Signed)
-----   Message from Wallene Dales sent at 06/01/2017 11:19 AM EST ----- Regarding: New Ref for Nutrition Please place referral for pt so I can attach. Let me know when it's completed.  Thank you

## 2017-06-02 ENCOUNTER — Ambulatory Visit
Admission: RE | Admit: 2017-06-02 | Discharge: 2017-06-02 | Disposition: A | Discharge status: Home / Self Care | Payer: Medicare Other | Source: Ambulatory Visit | Attending: Radiation Oncology | Admitting: Radiation Oncology

## 2017-06-02 ENCOUNTER — Ambulatory Visit: Payer: Medicare Other

## 2017-06-02 DIAGNOSIS — C3411 Malignant neoplasm of upper lobe, right bronchus or lung: Secondary | ICD-10-CM | POA: Diagnosis not present

## 2017-06-03 ENCOUNTER — Ambulatory Visit: Payer: Medicare Other

## 2017-06-03 ENCOUNTER — Ambulatory Visit
Admission: RE | Admit: 2017-06-03 | Discharge: 2017-06-03 | Disposition: A | Discharge status: Home / Self Care | Payer: Medicare Other | Source: Ambulatory Visit | Attending: Radiation Oncology | Admitting: Radiation Oncology

## 2017-06-03 DIAGNOSIS — C3411 Malignant neoplasm of upper lobe, right bronchus or lung: Secondary | ICD-10-CM | POA: Diagnosis not present

## 2017-06-04 ENCOUNTER — Ambulatory Visit
Admission: RE | Admit: 2017-06-04 | Discharge: 2017-06-04 | Disposition: A | Discharge status: Home / Self Care | Payer: Medicare Other | Source: Ambulatory Visit | Attending: Internal Medicine | Admitting: Internal Medicine

## 2017-06-04 ENCOUNTER — Inpatient Hospital Stay: Payer: Medicare Other | Attending: Internal Medicine | Admitting: Internal Medicine

## 2017-06-04 ENCOUNTER — Telehealth: Payer: Self-pay | Admitting: *Deleted

## 2017-06-04 ENCOUNTER — Ambulatory Visit: Payer: Medicare Other

## 2017-06-04 ENCOUNTER — Encounter: Payer: Self-pay | Admitting: Internal Medicine

## 2017-06-04 ENCOUNTER — Inpatient Hospital Stay: Payer: Medicare Other

## 2017-06-04 ENCOUNTER — Ambulatory Visit
Admission: RE | Admit: 2017-06-04 | Discharge: 2017-06-04 | Disposition: A | Discharge status: Home / Self Care | Payer: Medicare Other | Source: Ambulatory Visit | Attending: Radiation Oncology | Admitting: Radiation Oncology

## 2017-06-04 VITALS — BP 125/79 | HR 130 | Temp 98.7°F | Resp 20

## 2017-06-04 DIAGNOSIS — R0781 Pleurodynia: Secondary | ICD-10-CM | POA: Diagnosis not present

## 2017-06-04 DIAGNOSIS — F329 Major depressive disorder, single episode, unspecified: Secondary | ICD-10-CM | POA: Insufficient documentation

## 2017-06-04 DIAGNOSIS — C3431 Malignant neoplasm of lower lobe, right bronchus or lung: Secondary | ICD-10-CM | POA: Insufficient documentation

## 2017-06-04 DIAGNOSIS — R918 Other nonspecific abnormal finding of lung field: Secondary | ICD-10-CM | POA: Diagnosis not present

## 2017-06-04 DIAGNOSIS — J449 Chronic obstructive pulmonary disease, unspecified: Secondary | ICD-10-CM | POA: Insufficient documentation

## 2017-06-04 DIAGNOSIS — C770 Secondary and unspecified malignant neoplasm of lymph nodes of head, face and neck: Secondary | ICD-10-CM | POA: Insufficient documentation

## 2017-06-04 DIAGNOSIS — Z9221 Personal history of antineoplastic chemotherapy: Secondary | ICD-10-CM | POA: Diagnosis not present

## 2017-06-04 DIAGNOSIS — C7801 Secondary malignant neoplasm of right lung: Secondary | ICD-10-CM

## 2017-06-04 DIAGNOSIS — I10 Essential (primary) hypertension: Secondary | ICD-10-CM | POA: Diagnosis not present

## 2017-06-04 DIAGNOSIS — R0602 Shortness of breath: Secondary | ICD-10-CM | POA: Diagnosis not present

## 2017-06-04 DIAGNOSIS — Z923 Personal history of irradiation: Secondary | ICD-10-CM | POA: Insufficient documentation

## 2017-06-04 DIAGNOSIS — F17210 Nicotine dependence, cigarettes, uncomplicated: Secondary | ICD-10-CM | POA: Insufficient documentation

## 2017-06-04 DIAGNOSIS — R937 Abnormal findings on diagnostic imaging of other parts of musculoskeletal system: Secondary | ICD-10-CM | POA: Insufficient documentation

## 2017-06-04 DIAGNOSIS — C7951 Secondary malignant neoplasm of bone: Secondary | ICD-10-CM | POA: Diagnosis not present

## 2017-06-04 DIAGNOSIS — Z79899 Other long term (current) drug therapy: Secondary | ICD-10-CM | POA: Diagnosis not present

## 2017-06-04 DIAGNOSIS — J90 Pleural effusion, not elsewhere classified: Secondary | ICD-10-CM | POA: Insufficient documentation

## 2017-06-04 DIAGNOSIS — B182 Chronic viral hepatitis C: Secondary | ICD-10-CM | POA: Diagnosis not present

## 2017-06-04 DIAGNOSIS — C3411 Malignant neoplasm of upper lobe, right bronchus or lung: Secondary | ICD-10-CM | POA: Diagnosis not present

## 2017-06-04 LAB — CBC WITH DIFFERENTIAL/PLATELET
BASOS ABS: 0 10*3/uL (ref 0–0.1)
Basophils Relative: 1 %
Eosinophils Absolute: 0 10*3/uL (ref 0–0.7)
Eosinophils Relative: 1 %
HEMATOCRIT: 35.9 % (ref 35.0–47.0)
Hemoglobin: 12.1 g/dL (ref 12.0–16.0)
LYMPHS ABS: 0.5 10*3/uL — AB (ref 1.0–3.6)
LYMPHS PCT: 9 %
MCH: 28.7 pg (ref 26.0–34.0)
MCHC: 33.8 g/dL (ref 32.0–36.0)
MCV: 84.9 fL (ref 80.0–100.0)
MONO ABS: 0.7 10*3/uL (ref 0.2–0.9)
MONOS PCT: 12 %
NEUTROS ABS: 4.3 10*3/uL (ref 1.4–6.5)
Neutrophils Relative %: 77 %
Platelets: 243 10*3/uL (ref 150–440)
RBC: 4.23 MIL/uL (ref 3.80–5.20)
RDW: 20.1 % — ABNORMAL HIGH (ref 11.5–14.5)
WBC: 5.5 10*3/uL (ref 3.6–11.0)

## 2017-06-04 LAB — COMPREHENSIVE METABOLIC PANEL
ALBUMIN: 3.2 g/dL — AB (ref 3.5–5.0)
ALT: 44 U/L (ref 14–54)
AST: 108 U/L — ABNORMAL HIGH (ref 15–41)
Alkaline Phosphatase: 239 U/L — ABNORMAL HIGH (ref 38–126)
Anion gap: 11 (ref 5–15)
BUN: 10 mg/dL (ref 6–20)
CHLORIDE: 97 mmol/L — AB (ref 101–111)
CO2: 25 mmol/L (ref 22–32)
Calcium: 9.6 mg/dL (ref 8.9–10.3)
Creatinine, Ser: 0.63 mg/dL (ref 0.44–1.00)
GFR calc Af Amer: >60 mL/min (ref >60)
GFR calc non Af Amer: >60 mL/min (ref >60)
GLUCOSE: 90 mg/dL (ref 65–99)
POTASSIUM: 3.6 mmol/L (ref 3.5–5.1)
SODIUM: 133 mmol/L — AB (ref 135–145)
TOTAL PROTEIN: 8.6 g/dL — AB (ref 6.5–8.1)
Total Bilirubin: 0.5 mg/dL (ref 0.3–1.2)

## 2017-06-04 MED ORDER — HYDROCODONE-ACETAMINOPHEN 5-325 MG PO TABS: 1.0000 | ORAL_TABLET | Freq: Three times a day (TID) | ORAL | 0 refills | Status: DC | PRN

## 2017-06-04 NOTE — Progress Notes (Signed)
[facility] OFFICE PROGRESS NOTE  Patient Care Team: Center, [facility] as PCP - General (General Practice)  Cancer Staging No matching staging information was found for the patient.   Oncology History   Chief Complaint/Diagnosis:   1. 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 Faith Rogue, cardiothoracic surgeon) 2. Started on radiation and concurrent chemotherapy with carboplatinum Taxol from April of 2013 3. Finished radiation and chemotherapy with carboplatinum and Taxol in May of 2013 4. Stage IIIB (T4N3M0) NSCLC who completed a course of EBRT on Oct 07, 2011 with a total dose of 7000cGy.   5. Starting consolidation with 2 cycles of carboplatinum and Abraxane 6.patient is off chemotherapy since November of 2013.  Repeat CT scan shows stable disease(January, 2014) 7  Started been on Taxotere and  RAMICIRUMAB from June 07, 2013 8.chemotherapy with Taxotere  and  RAMUCIRUMAB discontinued in March of 2015 because of hemoptysis.  9.Started onNIVOLULAMAB April of 2015. 10NIVOLULAMABWas put on hold because of abnormal liver enzymes January of 2016  # AUG 2017- Right lung recurrence [2 nodules - ~1cm; stable lung mass/fibrosis]. START OPDIVO q 2W [small to Bx; s/p rad-onc eval]; opdivo x4- CT OCT 16th- PROGRESSION 2 lung nodules ~28mm;   # JAN 2018-carbo-TAXOL s/p 2 cycles- CT- PR; but carbo-anaphylaxis; STOPPED chemo  # July 2018- PROGRESSION- GEMCITABINE; 02/12/2017- PROGRESSION [? LFTs elevated sec to Gem; Gem Discontinued]  # MID OCT 2018- Abraxane; HOLD   # MOLECULAR TESTING- ? Not enough tissue; Oct 2018- right supraclav Bx- for molecular testing.      Primary cancer of bronchus of right lower lobe (HCC)     INTERVAL HISTORY:  Jeanette Jones 62 y.o.  female pleasant patient above history of stage IV lung cancer squamous cell/ recurrent- currently  on palliative radiation to the right upper lobe lung mass is here for follow-up.  Patient did not however have her supraclavicular lymph node biopsy as recommended in the past- "as she thought it was too invasive/spread her cancer"  Patient systemic chemotherapy with Abraxane is on hold because of poor tolerance-neutropenia admission to the hospital/sepsis.  Patient complains of intermittent worsening shortness of breath.  Chronic intermittent cough.  Chronic mild hemoptysis.  She also complains of pain in her bilateral ribs/laterally.  She has been taking hydrocodone but significant improvement.  Patient denies any tingling and numbness of the extremities. She denies any skin rash. No diarrhea.    REVIEW OF SYSTEMS:  A complete 10 point review of system is done which is negative except mentioned above/history of present illness.   PAST MEDICAL HISTORY :  Past Medical History:  Diagnosis Date  . Back pain, chronic   . Carcinoma of lung ([place]) 07/2011  . Cough   . Depression   . Dyspnea   . Emphysema of lung ([place])   . H/O tubal ligation 02/12/2017   From chart  . Heart murmur   . Hep C w/o coma, chronic ([place])   . Hypertension   . Knee pain 02/12/2017  . Lung cancer ([place])   . Shoulder pain 02/09/2017  . Swallowing difficulty     PAST SURGICAL HISTORY :   Past Surgical History:  Procedure Laterality Date  . ESOPHAGOGASTRODUODENOSCOPY N/A 06/08/2015   Procedure: ESOPHAGOGASTRODUODENOSCOPY (EGD);  Surgeon: Hulen Luster, MD;  Location: [facility] ENDOSCOPY;  Service: Gastroenterology;  Laterality: N/A;  . HEMORROIDECTOMY    .  LUNG BIOPSY    . TUBAL LIGATION      FAMILY HISTORY :   Family History  Problem Relation Age of Onset  . Hypertension Other   . Breast cancer Neg Hx     SOCIAL HISTORY:   Social History   Tobacco Use  . Smoking status: Current Every Day Smoker    Packs/day: 0.25    Years: 40.00    Pack years: 10.00    Types: Cigarettes  . Smokeless tobacco: Never Used   Substance Use Topics  . Alcohol use: Yes    Comment: beer on saturday  . Drug use: Yes    Types: Cocaine, Marijuana    Comment: 2-3 days ago; dr penwarden notified    ALLERGIES:  is allergic to carboplatin.  MEDICATIONS:  Current Outpatient Medications  Medication Sig Dispense Refill  . feeding supplement, ENSURE COMPLETE, (ENSURE COMPLETE) LIQD Take 237 mLs by mouth 2 (two) times daily between meals. 60 Bottle 0  . feeding supplement, ENSURE ENLIVE, (ENSURE ENLIVE) LIQD Take 237 mLs 4 (four) times daily by mouth. 237 mL 12  . Fluticasone-Salmeterol (ADVAIR DISKUS) 500-50 MCG/DOSE AEPB Inhale 1 puff into the lungs 2 (two) times daily. 1 each 3  . HYDROcodone-acetaminophen (NORCO/VICODIN) 5-325 MG tablet Take 1 tablet by mouth every 8 (eight) hours as needed for moderate pain. 60 tablet 0  . Multiple Vitamin (MULTIVITAMIN) LIQD Take 15 mLs daily by mouth. 500 mL 1  . acetaminophen (TYLENOL) 160 MG/5ML solution Take 20.3 mLs (650 mg total) every 6 (six) hours as needed by mouth for mild pain or fever. (Patient not taking: Reported on 06/04/2017) 120 mL 0  . alum & mag hydroxide-simeth (MAALOX/MYLANTA) 200-200-20 MG/5ML suspension Take 15 mLs every 6 (six) hours as needed by mouth for indigestion or heartburn. (Patient not taking: Reported on 06/04/2017) 355 mL 0  . cyclobenzaprine (FLEXERIL) 10 MG tablet Take 10 mg by mouth at bedtime.    Marland Kitchen levofloxacin (LEVAQUIN) 500 MG tablet Take 1 tablet (500 mg total) by mouth daily. (Patient not taking: Reported on 06/04/2017) 7 tablet 0  . ondansetron (ZOFRAN) 8 MG tablet Take 1 tablet (8 mg total) by mouth every 8 (eight) hours as needed for nausea or vomiting (start 3 days; after chemo). (Patient not taking: Reported on 06/04/2017) 40 tablet 1  . prochlorperazine (COMPAZINE) 10 MG tablet Take 1 tablet (10 mg total) by mouth every 6 (six) hours as needed for nausea or vomiting. (Patient not taking: Reported on 06/04/2017) 40 tablet 1   No current  facility-administered medications for this visit.     PHYSICAL EXAMINATION: ECOG PERFORMANCE STATUS: 0 - Asymptomatic  BP 125/79   Pulse (!) 130   Temp 98.7 F (37.1 C) (Tympanic)   Resp 20   There were no vitals filed for this visit.  GENERAL: Moderately nourished thin build.  Alert, no distress and comfortable.   Alone.  EYES: no pallor or icterus OROPHARYNX: no thrush or ulceration; good dentition  NECK: supple, no masses felt LYMPH:  no palpable lymphadenopathy in the cervical, axillary or inguinal regions LUNGS: Decreased air entry to auscultation bilaterally and  No wheeze or crackles HEART/CVS: regular rate & rhythm and no murmurs; No lower extremity edema ABDOMEN:abdomen soft, non-tender and normal bowel sounds Musculoskeletal:no cyanosis of digits and no clubbing  PSYCH: alert & oriented x 3 with fluent speech NEURO: no focal motor/sensory deficits SKIN:  no rashes or significant lesions  LABORATORY DATA:  I have reviewed the data as  listed    Component Value Date/Time   NA 133 (L) 06/04/2017 1000   NA 135 08/29/2014 1457   K 3.6 06/04/2017 1000   K 3.7 08/29/2014 1457   CL 97 (L) 06/04/2017 1000   CL 105 08/29/2014 1457   CO2 25 06/04/2017 1000   CO2 24 08/29/2014 1457   GLUCOSE 90 06/04/2017 1000   GLUCOSE 98 08/29/2014 1457   BUN 10 06/04/2017 1000   BUN 8 08/29/2014 1457   CREATININE 0.63 06/04/2017 1000   CREATININE 0.64 08/29/2014 1457   CALCIUM 9.6 06/04/2017 1000   CALCIUM 9.0 08/29/2014 1457   PROT 8.6 (H) 06/04/2017 1000   PROT 9.3 (H) 08/29/2014 1457   ALBUMIN 3.2 (L) 06/04/2017 1000   ALBUMIN 3.6 08/29/2014 1457   AST 108 (H) 06/04/2017 1000   AST 111 (H) 08/29/2014 1457   ALT 44 06/04/2017 1000   ALT 78 (H) 08/29/2014 1457   ALKPHOS 239 (H) 06/04/2017 1000   ALKPHOS 168 (H) 08/29/2014 1457   BILITOT 0.5 06/04/2017 1000   BILITOT 0.6 08/29/2014 1457   GFRNONAA >60 06/04/2017 1000   GFRNONAA >60 08/29/2014 1457   GFRAA >60 06/04/2017  1000   GFRAA >60 08/29/2014 1457    No results found for: SPEP, UPEP  Lab Results  Component Value Date   WBC 5.5 06/04/2017   NEUTROABS 4.3 06/04/2017   HGB 12.1 06/04/2017   HCT 35.9 06/04/2017   MCV 84.9 06/04/2017   PLT 243 06/04/2017      Chemistry      Component Value Date/Time   NA 133 (L) 06/04/2017 1000   NA 135 08/29/2014 1457   K 3.6 06/04/2017 1000   K 3.7 08/29/2014 1457   CL 97 (L) 06/04/2017 1000   CL 105 08/29/2014 1457   CO2 25 06/04/2017 1000   CO2 24 08/29/2014 1457   BUN 10 06/04/2017 1000   BUN 8 08/29/2014 1457   CREATININE 0.63 06/04/2017 1000   CREATININE 0.64 08/29/2014 1457      Component Value Date/Time   CALCIUM 9.6 06/04/2017 1000   CALCIUM 9.0 08/29/2014 1457   ALKPHOS 239 (H) 06/04/2017 1000   ALKPHOS 168 (H) 08/29/2014 1457   AST 108 (H) 06/04/2017 1000   AST 111 (H) 08/29/2014 1457   ALT 44 06/04/2017 1000   ALT 78 (H) 08/29/2014 1457   BILITOT 0.5 06/04/2017 1000   BILITOT 0.6 08/29/2014 1457     IMPRESSION: 1. 6 cm hypermetabolic mass anteriorly in the primarily collapsed right upper lobe, maximum SUV 13.8. Associated paratracheal, subcarinal, and supraclavicular metastatic adenopathy. 2. The masslike density in the right lower lobe has low-grade metabolic activity, potentially from prior radiation therapy. 3. 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 4. No findings of metastatic disease to the abdomen or pelvis.   Electronically Signed   By: Van Clines M.D.   On: 02/12/2017 12:31    RADIOGRAPHIC STUDIES: I have personally reviewed the radiological images as listed and agreed with the findings in the report. No results found.   ASSESSMENT & PLAN:  Primary cancer of bronchus of right lower lobe ([place]) # Right lung ca- with recurrence [based on imaging]-  metastases to right lung ~10-89mm;  OCT 5th PET- progression of the right upper lobe primary; right supraclav LN.   #Given poor tolerance to systemic therapy-continue palliative radiation to the right upper lobe  lung.  # I would recommend biopsy of the right supraclavicular lymph node- for more tissue for molecular testing; missed multiple appts; today again discussed the rationale.  I also reassured the patient that this should not lead to progression of disease; and this is not a excisional but a core biopsy.  Patient is agreeable.  # worsening back pain/bilateral rib pain- ? Etiology- on hydrocodone.  Check x-rays; if not improved we will get a bone scan.  Continue 1 every 8 hours.   # COPD/bronchitis-STABLE. On advair/ albuterol.   # follow up in 3 weeks labs.    Orders Placed This Encounter  Procedures  . DG Ribs Bilateral W/Chest    Standing Status:   Future    Number of Occurrences:   1    Standing Expiration Date:   08/03/2018    Order Specific Question:   Reason for Exam (SYMPTOM  OR DIAGNOSIS REQUIRED)    Answer:   bil rib pain; shortness of breath    Order Specific Question:   Is patient pregnant?    Answer:   No    Order Specific Question:   Preferred imaging location?    Answer:   [facility]    Order Specific Question:   Radiology Contrast Protocol - do NOT remove file path    Answer:   [email or web address]      Cammie Sickle, MD 06/04/2017 12:13 PM

## 2017-06-04 NOTE — Telephone Encounter (Signed)
Called report  IMPRESSION: Cortical irregularity RIGHT fourth and LEFT fifth ribs, this could reflect nondisplaced fracture or metastasis given history of lung cancer.  RIGHT upper and RIGHT middle lobe consolidation concerning for disease progression and superimposed atelectasis. Small RIGHT pleural effusion.  These results will be called to the ordering clinician or representative by the Radiologist Assistant, and communication documented in the PACS or zVision Dashboard.   Electronically Signed By: Elon Alas M.D. On: 06/04/2017 14:01

## 2017-06-04 NOTE — Progress Notes (Signed)
Patient here for follow-up for lung cancer. Patient states that she did not take any Levaquin as prescribed (on 05/12/17). She states "I didn't see the point of taking the antibotics when I'm not going to have this biopsy. My children told me that they didn't want anyone else cutting on me. IF they do this biopsy the cancer would spread and I'm not going through this procedure." I explained that the purpose of the antibiotic was to improve her upper respiratory infection symptoms. Pt states that she was not aware of the purpose of the antibiotics. I picked up the prescription for it but never took it"  Patient states that she "still has the productive cough. I'm not getting any better." I reiterated that the purpose of the antibiotics to aid her recovery and the purpose of the biopsy was to obtain additional studies from pathology to determine if there are other chemotherapy options for her. Pt replied, "if that is the case, then sign me up for the biopsy. I didn't know. I didn't want then, but if the doctor states that if he needs it, then I will do it."  "All I want today is my narcotics. I stay in pain and that's all I need today."   Md made aware of RN/pt assessment/conversation.

## 2017-06-04 NOTE — Assessment & Plan Note (Addendum)
#   Right lung ca- with recurrence [based on imaging]- metastases to right lung ~10-54mm;  OCT 5th PET- progression of the right upper lobe primary; right supraclav LN.   #Given poor tolerance to systemic therapy-continue palliative radiation to the right upper lobe lung.  # I would recommend biopsy of the right supraclavicular lymph node- for more tissue for molecular testing; missed multiple appts; today again discussed the rationale.  I also reassured the patient that this should not lead to progression of disease; and this is not a excisional but a core biopsy.  Patient is agreeable.  # worsening back pain/bilateral rib pain- ? Etiology- on hydrocodone.  Check x-rays; if not improved we will get a bone scan.  Continue 1 every 8 hours.   # COPD/bronchitis-STABLE. On advair/ albuterol.   # follow up in 3 weeks labs.

## 2017-06-07 ENCOUNTER — Ambulatory Visit: Payer: Medicare Other

## 2017-06-07 ENCOUNTER — Ambulatory Visit
Admission: RE | Admit: 2017-06-07 | Discharge: 2017-06-07 | Disposition: A | Discharge status: Home / Self Care | Payer: Medicare Other | Source: Ambulatory Visit | Attending: Radiation Oncology | Admitting: Radiation Oncology

## 2017-06-07 DIAGNOSIS — C3411 Malignant neoplasm of upper lobe, right bronchus or lung: Secondary | ICD-10-CM | POA: Diagnosis not present

## 2017-06-08 ENCOUNTER — Telehealth: Payer: Self-pay | Admitting: *Deleted

## 2017-06-08 ENCOUNTER — Ambulatory Visit: Payer: Medicare Other

## 2017-06-08 ENCOUNTER — Ambulatory Visit
Admission: RE | Admit: 2017-06-08 | Discharge: 2017-06-08 | Disposition: A | Discharge status: Home / Self Care | Payer: Medicare Other | Source: Ambulatory Visit | Attending: Radiation Oncology | Admitting: Radiation Oncology

## 2017-06-08 ENCOUNTER — Other Ambulatory Visit: Payer: Self-pay | Admitting: *Deleted

## 2017-06-08 DIAGNOSIS — C3431 Malignant neoplasm of lower lobe, right bronchus or lung: Secondary | ICD-10-CM

## 2017-06-08 DIAGNOSIS — C3411 Malignant neoplasm of upper lobe, right bronchus or lung: Secondary | ICD-10-CM | POA: Diagnosis not present

## 2017-06-08 NOTE — Telephone Encounter (Signed)
-----   Message from Cammie Sickle, MD sent at 06/04/2017  2:14 PM EST ----- Please inform patient that chest x-ray suggestive of for right upper lobe cancer for which she is getting the radiation.  Question rib fractures; continue pain medication for now.  If worse; patient to inform us; then would recommend a bone scan

## 2017-06-08 NOTE — Telephone Encounter (Signed)
unable to leave vm.

## 2017-06-08 NOTE — Telephone Encounter (Signed)
Spoke with patient in the office. She is agreeable to the NM bone scan. Pt reports that the bilateral rib pain has not improved. She did mention that she was not able to swallow the levaquin capsules and wanted a different antibiotics. Pt's phone has been disconnected. She will be back in the office tomorrow for her radiation. We can let her know what to do with the antibiotics at that time. Dr. Rogue Bussing - please advise. We can discuss in clinic tomorrow. Pt's radiation apt is around 10 am.

## 2017-06-09 ENCOUNTER — Ambulatory Visit
Admission: RE | Admit: 2017-06-09 | Discharge: 2017-06-09 | Disposition: A | Discharge status: Home / Self Care | Payer: Medicare Other | Source: Ambulatory Visit | Attending: Radiation Oncology | Admitting: Radiation Oncology

## 2017-06-09 ENCOUNTER — Ambulatory Visit: Payer: Medicare Other

## 2017-06-09 DIAGNOSIS — C3411 Malignant neoplasm of upper lobe, right bronchus or lung: Secondary | ICD-10-CM | POA: Diagnosis not present

## 2017-06-10 ENCOUNTER — Ambulatory Visit
Admission: RE | Admit: 2017-06-10 | Discharge: 2017-06-10 | Disposition: A | Discharge status: Home / Self Care | Payer: Medicare Other | Source: Ambulatory Visit | Attending: Radiation Oncology | Admitting: Radiation Oncology

## 2017-06-10 ENCOUNTER — Inpatient Hospital Stay: Payer: Medicare Other

## 2017-06-10 DIAGNOSIS — C3411 Malignant neoplasm of upper lobe, right bronchus or lung: Secondary | ICD-10-CM | POA: Diagnosis not present

## 2017-06-10 NOTE — Progress Notes (Signed)
Nutrition Assessment   Reason for Assessment:   Weight loss  ASSESSMENT:  61 year old female with stage IV lung cancer on palliative XRT.  Noted lung cancer first diagnosis in 2013, now with progression. Past medical history of depression, hep c, HTN, swallowing difficulty  Met with patient following radiation therapy today.  Patient reports no appetite, shortness of breath to walk to kitchen and prepare foods, and trouble swallowing (feels like knot in throat been there for years).  Patient reports she drinks ensure/boost when she can get them usually 1 per day.  Reports that she drinks it in the morning and then does not eat much during the day.  Reports that she has to take small bites of food and gets fatigued easily.  Lives with daughters but they work different shifts and not with her during the day.    Nutrition Focused Physical Exam: Nutrition-Focused physical exam completed. Findings are moderate orbital, severe upper arm, moderate thoracic/lumbar fat depletion, moderate temple, hand normal, patellar severe, thigh severe and calf severe muscle depletion, and no edema.   Modified exam as patient fully clothed and shortness of breath with moving around for exam.     Medications: MVI, zofran, compazine  Labs: Na 133  Anthropometrics:   Height: 63 inches Weight: 109 lb on 12/7 UBW: 131 lb about 1 year ago per patient. Noted 118 lb on 06/12/2016 BMI: 19  7% weight loss in the last year   Estimated Energy Needs  Kcals: 1500-1750 calories/d Protein: 75-88 g/d Fluid: 1.7L/d  NUTRITION DIAGNOSIS: Malnutrition related to cancer, shortness of breath, dysphagia as evidenced by eating < 75% of estimated energy needs for > or equal to 1 month and severe muscle and fat loss   MALNUTRITION DIAGNOSIS: Patient meets criteria for severe malnutrition related to eating < 75% of estimated energy needs for > or equal to 1 month and severe muscle mass and fat mass  depletion   INTERVENTION:   Encouraged patient to eat small frequent meals. Discussed easy to prepare foods and foods to keep on hand due to fatigue Provided patient with 1st complimentary case of ensure plus. Encouraged use for additional calories and protein.    MONITORING, EVALUATION, GOAL: weight trends, intake   NEXT VISIT: Feb 21  Jeanette Jones B. Zenia Resides, [place], [place] Registered Dietitian [phone] (pager)

## 2017-06-11 ENCOUNTER — Ambulatory Visit
Admission: RE | Admit: 2017-06-11 | Discharge: 2017-06-11 | Disposition: A | Discharge status: Home / Self Care | Payer: Medicare Other | Source: Ambulatory Visit | Attending: Radiation Oncology | Admitting: Radiation Oncology

## 2017-06-11 DIAGNOSIS — C3411 Malignant neoplasm of upper lobe, right bronchus or lung: Secondary | ICD-10-CM | POA: Diagnosis not present

## 2017-06-14 ENCOUNTER — Encounter
Admission: RE | Admit: 2017-06-14 | Discharge: 2017-06-14 | Disposition: A | Discharge status: Home / Self Care | Payer: Medicare Other | Source: Ambulatory Visit | Attending: Internal Medicine | Admitting: Internal Medicine

## 2017-06-14 ENCOUNTER — Ambulatory Visit: Payer: Medicare Other

## 2017-06-14 ENCOUNTER — Ambulatory Visit
Admission: RE | Admit: 2017-06-14 | Discharge: 2017-06-14 | Disposition: A | Discharge status: Home / Self Care | Payer: Medicare Other | Source: Ambulatory Visit | Attending: Radiation Oncology | Admitting: Radiation Oncology

## 2017-06-14 DIAGNOSIS — C3411 Malignant neoplasm of upper lobe, right bronchus or lung: Secondary | ICD-10-CM | POA: Diagnosis not present

## 2017-06-14 DIAGNOSIS — C3431 Malignant neoplasm of lower lobe, right bronchus or lung: Secondary | ICD-10-CM | POA: Diagnosis present

## 2017-06-14 MED ORDER — TECHNETIUM TC 99M MEDRONATE IV KIT
22.9800 | PACK | Freq: Once | INTRAVENOUS | Status: AC | PRN
  Administered 2017-06-14: 22.98 via INTRAVENOUS

## 2017-06-15 ENCOUNTER — Ambulatory Visit
Admission: RE | Admit: 2017-06-15 | Discharge: 2017-06-15 | Disposition: A | Discharge status: Home / Self Care | Payer: Medicare Other | Source: Ambulatory Visit | Attending: Radiation Oncology | Admitting: Radiation Oncology

## 2017-06-15 DIAGNOSIS — C3411 Malignant neoplasm of upper lobe, right bronchus or lung: Secondary | ICD-10-CM | POA: Diagnosis not present

## 2017-06-16 ENCOUNTER — Telehealth: Payer: Self-pay | Admitting: *Deleted

## 2017-06-16 ENCOUNTER — Inpatient Hospital Stay (HOSPITAL_BASED_OUTPATIENT_CLINIC_OR_DEPARTMENT_OTHER): Payer: Medicare Other | Admitting: Internal Medicine

## 2017-06-16 DIAGNOSIS — C770 Secondary and unspecified malignant neoplasm of lymph nodes of head, face and neck: Secondary | ICD-10-CM

## 2017-06-16 DIAGNOSIS — Z79899 Other long term (current) drug therapy: Secondary | ICD-10-CM | POA: Diagnosis not present

## 2017-06-16 DIAGNOSIS — Z923 Personal history of irradiation: Secondary | ICD-10-CM

## 2017-06-16 DIAGNOSIS — F17210 Nicotine dependence, cigarettes, uncomplicated: Secondary | ICD-10-CM

## 2017-06-16 DIAGNOSIS — C7951 Secondary malignant neoplasm of bone: Secondary | ICD-10-CM

## 2017-06-16 DIAGNOSIS — R0781 Pleurodynia: Secondary | ICD-10-CM

## 2017-06-16 DIAGNOSIS — C3431 Malignant neoplasm of lower lobe, right bronchus or lung: Secondary | ICD-10-CM

## 2017-06-16 DIAGNOSIS — C7801 Secondary malignant neoplasm of right lung: Secondary | ICD-10-CM

## 2017-06-16 DIAGNOSIS — Z9221 Personal history of antineoplastic chemotherapy: Secondary | ICD-10-CM

## 2017-06-16 DIAGNOSIS — J449 Chronic obstructive pulmonary disease, unspecified: Secondary | ICD-10-CM

## 2017-06-16 MED ORDER — ALBUTEROL SULFATE HFA 108 (90 BASE) MCG/ACT IN AERS: 2.0000 | INHALATION_SPRAY | Freq: Four times a day (QID) | RESPIRATORY_TRACT | 2 refills | Status: AC | PRN

## 2017-06-16 MED ORDER — FLUTICASONE-SALMETEROL 500-50 MCG/DOSE IN AEPB: 1.0000 | INHALATION_SPRAY | Freq: Two times a day (BID) | RESPIRATORY_TRACT | 3 refills | Status: AC

## 2017-06-16 MED ORDER — HYDROCODONE-ACETAMINOPHEN 5-325 MG PO TABS: 1.0000 | ORAL_TABLET | Freq: Three times a day (TID) | ORAL | 0 refills | Status: DC | PRN

## 2017-06-16 MED ORDER — FENTANYL 25 MCG/HR TD PT72: 25.0000 ug | MEDICATED_PATCH | TRANSDERMAL | 0 refills | Status: DC

## 2017-06-16 MED ORDER — SUCRALFATE 1 G PO TABS: 1.0000 g | ORAL_TABLET | Freq: Three times a day (TID) | ORAL | 0 refills | Status: AC

## 2017-06-16 NOTE — Assessment & Plan Note (Addendum)
#   Right lung ca- with recurrence [based on imaging]- metastases to right lung ~10-78mm;  OCT 5th PET- progression of the right upper lobe primary; right supraclav LN.   #Patient currently status post palliative radiation to the right upper lobe primary; but noted to have progressive bone metastases [bone scan Jan 2019-see discussion below]  #Given the progressive bone metastasis recommend systemic chemotherapy with Abraxane.  Recommend reduce dose of Abraxane given poor tolerance.  Discussed with the patient the overall poor prognosis of cancer; palliative; not curative.  # I would recommend biopsy of the right supraclavicular lymph node- for more tissue for molecular testing; missed multiple appts; today again discussed the rationale.  Again is interested.  # worsening back pain/bilateral rib pain- sec to mets. Pain control- fenatnyl 25 mcg; continue hydrocodne-1.; start x-geva.   # COPD/bronchitis-STABLE. On advair/ albuterol.   #Appointments as planned for next week/chemotherapy-Abraxane labs  # I reviewed the blood work- with the patient in detail; also reviewed the imaging independently [as summarized above]; and with the patient in detail.

## 2017-06-16 NOTE — Telephone Encounter (Signed)
Contacted patient per MD order. Requested pt to come in today to discuss test results. Patient agreeable. Colette spoke with Lucianne Lei transportation who can have patient in the clinic by [phone] am today.

## 2017-06-17 NOTE — Progress Notes (Signed)
[facility] OFFICE PROGRESS NOTE  Patient Care Team: Center, [facility] as PCP - General (General Practice)  Cancer Staging No matching staging information was found for the patient.   Oncology History   Chief Complaint/Diagnosis:   1. 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 Faith Rogue, cardiothoracic surgeon) 2. Started on radiation and concurrent chemotherapy with carboplatinum Taxol from April of 2013 3. Finished radiation and chemotherapy with carboplatinum and Taxol in May of 2013 4. Stage IIIB (T4N3M0) NSCLC who completed a course of EBRT on Oct 07, 2011 with a total dose of 7000cGy.   5. Starting consolidation with 2 cycles of carboplatinum and Abraxane 6.patient is off chemotherapy since November of 2013.  Repeat CT scan shows stable disease(January, 2014) 7  Started been on Taxotere and  RAMICIRUMAB from June 07, 2013 8.chemotherapy with Taxotere  and  RAMUCIRUMAB discontinued in March of 2015 because of hemoptysis.  9.Started onNIVOLULAMAB April of 2015. 10NIVOLULAMABWas put on hold because of abnormal liver enzymes January of 2016  # AUG 2017- Right lung recurrence [2 nodules - ~1cm; stable lung mass/fibrosis]. START OPDIVO q 2W [small to Bx; s/p rad-onc eval]; opdivo x4- CT OCT 16th- PROGRESSION 2 lung nodules ~46mm;   # JAN 2018-carbo-TAXOL s/p 2 cycles- CT- PR; but carbo-anaphylaxis; STOPPED chemo  # July 2018- PROGRESSION- GEMCITABINE; 02/12/2017- PROGRESSION [? LFTs elevated sec to Gem; Gem Discontinued]  # MID OCT 2018- Abraxane; HOLD [sepsis]; Jan 2019- RT to RUL  # Jan 2019- Progression on Bone scan; Feb 7th 2019- Abraxane  # MOLECULAR TESTING- ? Not enough tissue; Oct 2018- right supraclav Bx- for molecular testing.      Primary cancer of bronchus of right lower lobe (HCC)     INTERVAL HISTORY:  Jeanette Jones 62 y.o.   female pleasant patient above history of stage IV lung cancer squamous cell/ recurrent- currently on palliative radiation to the right upper lobe lung mass is here for follow-up/review the results of the bone scan.  Patient complains of bone pain "all over"; currently on hydrocodone 1 pill every 4-6 hours.  Not helping.  Patient complains of intermittent worsening shortness of breath.  Chronic intermittent cough.  Chronic mild hemoptysis.  Patient denies any tingling and numbness of the extremities. She denies any skin rash. No diarrhea.    REVIEW OF SYSTEMS:  A complete 10 point review of system is done which is negative except mentioned above/history of present illness.   PAST MEDICAL HISTORY :  Past Medical History:  Diagnosis Date  . Back pain, chronic   . Carcinoma of lung ([place]) 07/2011  . Cough   . Depression   . Dyspnea   . Emphysema of lung ([place])   . H/O tubal ligation 02/12/2017   From chart  . Heart murmur   . Hep C w/o coma, chronic ([place])   . Hypertension   . Knee pain 02/12/2017  . Lung cancer ([place])   . Shoulder pain 02/09/2017  . Swallowing difficulty     PAST SURGICAL HISTORY :   Past Surgical History:  Procedure Laterality Date  . ESOPHAGOGASTRODUODENOSCOPY N/A 06/08/2015   Procedure: ESOPHAGOGASTRODUODENOSCOPY (EGD);  Surgeon: Hulen Luster, MD;  Location: [facility] ENDOSCOPY;  Service: Gastroenterology;  Laterality: N/A;  . HEMORROIDECTOMY    . LUNG BIOPSY    . TUBAL LIGATION      FAMILY HISTORY :   Family  History  Problem Relation Age of Onset  . Hypertension Other   . Breast cancer Neg Hx     SOCIAL HISTORY:   Social History   Tobacco Use  . Smoking status: Current Every Day Smoker    Packs/day: 0.25    Years: 40.00    Pack years: 10.00    Types: Cigarettes  . Smokeless tobacco: Never Used  Substance Use Topics  . Alcohol use: Yes    Comment: beer on saturday  . Drug use: Yes    Types: Cocaine, Marijuana    Comment: 2-3 days ago; dr penwarden  notified    ALLERGIES:  is allergic to carboplatin.  MEDICATIONS:  Current Outpatient Medications  Medication Sig Dispense Refill  . feeding supplement, ENSURE COMPLETE, (ENSURE COMPLETE) LIQD Take 237 mLs by mouth 2 (two) times daily between meals. 60 Bottle 0  . feeding supplement, ENSURE ENLIVE, (ENSURE ENLIVE) LIQD Take 237 mLs 4 (four) times daily by mouth. 237 mL 12  . Fluticasone-Salmeterol (ADVAIR DISKUS) 500-50 MCG/DOSE AEPB Inhale 1 puff into the lungs 2 (two) times daily. 1 each 3  . HYDROcodone-acetaminophen (NORCO/VICODIN) 5-325 MG tablet Take 1 tablet by mouth every 8 (eight) hours as needed for moderate pain. 60 tablet 0  . Multiple Vitamin (MULTIVITAMIN) LIQD Take 15 mLs daily by mouth. 500 mL 1  . acetaminophen (TYLENOL) 160 MG/5ML solution Take 20.3 mLs (650 mg total) every 6 (six) hours as needed by mouth for mild pain or fever. (Patient not taking: Reported on 06/04/2017) 120 mL 0  . albuterol (PROVENTIL HFA;VENTOLIN HFA) 108 (90 Base) MCG/ACT inhaler Inhale 2 puffs into the lungs every 6 (six) hours as needed for wheezing or shortness of breath. 1 Inhaler 2  . alum & mag hydroxide-simeth (MAALOX/MYLANTA) 200-200-20 MG/5ML suspension Take 15 mLs every 6 (six) hours as needed by mouth for indigestion or heartburn. (Patient not taking: Reported on 06/04/2017) 355 mL 0  . cyclobenzaprine (FLEXERIL) 10 MG tablet Take 10 mg by mouth at bedtime.    . fentaNYL (DURAGESIC - DOSED MCG/HR) 25 MCG/HR patch Place 1 patch (25 mcg total) onto the skin every 3 (three) days. 5 patch 0  . levofloxacin (LEVAQUIN) 500 MG tablet Take 1 tablet (500 mg total) by mouth daily. (Patient not taking: Reported on 06/04/2017) 7 tablet 0  . ondansetron (ZOFRAN) 8 MG tablet Take 1 tablet (8 mg total) by mouth every 8 (eight) hours as needed for nausea or vomiting (start 3 days; after chemo). (Patient not taking: Reported on 06/04/2017) 40 tablet 1  . prochlorperazine (COMPAZINE) 10 MG tablet Take 1 tablet (10  mg total) by mouth every 6 (six) hours as needed for nausea or vomiting. (Patient not taking: Reported on 06/04/2017) 40 tablet 1  . sucralfate (CARAFATE) 1 g tablet Take 1 tablet (1 g total) by mouth 4 (four) times daily -  with meals and at bedtime. Dissolve in water; prior to taking it. 45 tablet 0   No current facility-administered medications for this visit.     PHYSICAL EXAMINATION: ECOG PERFORMANCE STATUS: 0 - Asymptomatic  BP 113/80 (BP Location: Right Arm, Patient Position: Sitting)   Pulse (!) 128   Temp (!) 97.5 F (36.4 C) (Oral)   Wt 113 lb 12.8 oz (51.6 kg)   SpO2 94%   BMI 20.16 kg/m   Filed Weights   06/16/17 1106 06/16/17 1107  Weight: 113 lb 12.8 oz (51.6 kg) 113 lb 12.8 oz (51.6 kg)    GENERAL: Moderately nourished  thin build.  Alert, no distress and comfortable.   Alone.  EYES: no pallor or icterus OROPHARYNX: no thrush or ulceration; good dentition  NECK: supple, no masses felt LYMPH:  no palpable lymphadenopathy in the cervical, axillary or inguinal regions LUNGS: Decreased air entry to auscultation bilaterally and  No wheeze or crackles HEART/CVS: regular rate & rhythm and no murmurs; No lower extremity edema ABDOMEN:abdomen soft, non-tender and normal bowel sounds Musculoskeletal:no cyanosis of digits and no clubbing  PSYCH: alert & oriented x 3 with fluent speech NEURO: no focal motor/sensory deficits SKIN:  no rashes or significant lesions  LABORATORY DATA:  I have reviewed the data as listed    Component Value Date/Time   NA 133 (L) 06/04/2017 1000   NA 135 08/29/2014 1457   K 3.6 06/04/2017 1000   K 3.7 08/29/2014 1457   CL 97 (L) 06/04/2017 1000   CL 105 08/29/2014 1457   CO2 25 06/04/2017 1000   CO2 24 08/29/2014 1457   GLUCOSE 90 06/04/2017 1000   GLUCOSE 98 08/29/2014 1457   BUN 10 06/04/2017 1000   BUN 8 08/29/2014 1457   CREATININE 0.63 06/04/2017 1000   CREATININE 0.64 08/29/2014 1457   CALCIUM 9.6 06/04/2017 1000   CALCIUM 9.0  08/29/2014 1457   PROT 8.6 (H) 06/04/2017 1000   PROT 9.3 (H) 08/29/2014 1457   ALBUMIN 3.2 (L) 06/04/2017 1000   ALBUMIN 3.6 08/29/2014 1457   AST 108 (H) 06/04/2017 1000   AST 111 (H) 08/29/2014 1457   ALT 44 06/04/2017 1000   ALT 78 (H) 08/29/2014 1457   ALKPHOS 239 (H) 06/04/2017 1000   ALKPHOS 168 (H) 08/29/2014 1457   BILITOT 0.5 06/04/2017 1000   BILITOT 0.6 08/29/2014 1457   GFRNONAA >60 06/04/2017 1000   GFRNONAA >60 08/29/2014 1457   GFRAA >60 06/04/2017 1000   GFRAA >60 08/29/2014 1457    No results found for: SPEP, UPEP  Lab Results  Component Value Date   WBC 5.5 06/04/2017   NEUTROABS 4.3 06/04/2017   HGB 12.1 06/04/2017   HCT 35.9 06/04/2017   MCV 84.9 06/04/2017   PLT 243 06/04/2017      Chemistry      Component Value Date/Time   NA 133 (L) 06/04/2017 1000   NA 135 08/29/2014 1457   K 3.6 06/04/2017 1000   K 3.7 08/29/2014 1457   CL 97 (L) 06/04/2017 1000   CL 105 08/29/2014 1457   CO2 25 06/04/2017 1000   CO2 24 08/29/2014 1457   BUN 10 06/04/2017 1000   BUN 8 08/29/2014 1457   CREATININE 0.63 06/04/2017 1000   CREATININE 0.64 08/29/2014 1457      Component Value Date/Time   CALCIUM 9.6 06/04/2017 1000   CALCIUM 9.0 08/29/2014 1457   ALKPHOS 239 (H) 06/04/2017 1000   ALKPHOS 168 (H) 08/29/2014 1457   AST 108 (H) 06/04/2017 1000   AST 111 (H) 08/29/2014 1457   ALT 44 06/04/2017 1000   ALT 78 (H) 08/29/2014 1457   BILITOT 0.5 06/04/2017 1000   BILITOT 0.6 08/29/2014 1457     IMPRESSION: 1. 6 cm hypermetabolic mass anteriorly in the primarily collapsed right upper lobe, maximum SUV 13.8. Associated paratracheal, subcarinal, and supraclavicular metastatic adenopathy. 2. The masslike density in the right lower lobe has low-grade metabolic activity, potentially from prior radiation therapy. 3. 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 4. No findings of metastatic disease to the abdomen or pelvis.   Electronically Signed   By: Van Clines M.D.   On: 02/12/2017 12:31    RADIOGRAPHIC STUDIES: I have personally reviewed the radiological images as listed and agreed with the findings in the report. No results found.   ASSESSMENT & PLAN:  Primary cancer of bronchus of right lower lobe ([place]) # Right lung ca- with recurrence [based on imaging]- metastases to right lung ~10-62mm;  OCT 5th PET- progression of the right upper lobe primary; right supraclav LN.   #Patient currently status post palliative radiation to the right upper lobe primary; but noted to have progressive bone metastases [bone scan Jan 2019-see discussion below]  #Given the progressive bone metastasis recommend systemic chemotherapy with Abraxane.  Recommend reduce dose of Abraxane given poor tolerance.  Discussed with the patient the overall poor prognosis of cancer; palliative; not curative.  # I would recommend biopsy of the right supraclavicular lymph node- for more tissue for molecular testing; missed multiple appts; today again discussed the rationale.  Again is interested.  # worsening back pain/bilateral rib pain- sec to mets. Pain control- fenatnyl 25 mcg; continue hydrocodne-1.; start x-geva.   # COPD/bronchitis-STABLE. On advair/ albuterol.   #Appointments as planned for next week/chemotherapy-Abraxane labs  # I reviewed the blood work- with the patient in detail; also reviewed the imaging independently [as summarized above]; and with the patient in detail.     Orders Placed This Encounter  Procedures  . CBC with Differential/Platelet    Standing Status:   Future    Standing Expiration Date:   06/16/2018  . Comprehensive metabolic panel    Standing Status:   Future    Standing Expiration Date:   06/16/2018      Cammie Sickle, MD 06/17/2017 8:21 AM

## 2017-06-21 ENCOUNTER — Telehealth: Payer: Self-pay | Admitting: *Deleted

## 2017-06-21 NOTE — Telephone Encounter (Signed)
Call attempt to reach patient to discuss biopsy date/time. No answer. Unable to leave vm for patient. (bx date 2/12. With arrival time at 10 am. Npo 6-8 hrs prior

## 2017-06-21 NOTE — Telephone Encounter (Signed)
Spoke with patient. She is aware of the biopsy date/time and to be NPO 608 hrs prior to the procedure. She was instructed to bring a driver to this apt as she will be given sedation.

## 2017-06-23 ENCOUNTER — Ambulatory Visit: Payer: Medicare Other | Admitting: Internal Medicine

## 2017-06-23 ENCOUNTER — Other Ambulatory Visit: Payer: Medicare Other

## 2017-06-24 ENCOUNTER — Other Ambulatory Visit: Payer: Self-pay

## 2017-06-24 ENCOUNTER — Inpatient Hospital Stay: Payer: Medicare Other

## 2017-06-24 ENCOUNTER — Inpatient Hospital Stay: Payer: Medicare Other | Attending: Internal Medicine

## 2017-06-24 ENCOUNTER — Other Ambulatory Visit: Payer: Self-pay | Admitting: *Deleted

## 2017-06-24 ENCOUNTER — Inpatient Hospital Stay (HOSPITAL_BASED_OUTPATIENT_CLINIC_OR_DEPARTMENT_OTHER): Payer: Medicare Other | Admitting: Internal Medicine

## 2017-06-24 VITALS — BP 120/85 | HR 139 | Temp 97.9°F | Resp 24 | Ht 63.0 in | Wt 121.0 lb

## 2017-06-24 DIAGNOSIS — J449 Chronic obstructive pulmonary disease, unspecified: Secondary | ICD-10-CM

## 2017-06-24 DIAGNOSIS — B182 Chronic viral hepatitis C: Secondary | ICD-10-CM | POA: Insufficient documentation

## 2017-06-24 DIAGNOSIS — C7951 Secondary malignant neoplasm of bone: Secondary | ICD-10-CM | POA: Diagnosis not present

## 2017-06-24 DIAGNOSIS — C7801 Secondary malignant neoplasm of right lung: Secondary | ICD-10-CM | POA: Diagnosis not present

## 2017-06-24 DIAGNOSIS — C770 Secondary and unspecified malignant neoplasm of lymph nodes of head, face and neck: Secondary | ICD-10-CM | POA: Diagnosis not present

## 2017-06-24 DIAGNOSIS — F329 Major depressive disorder, single episode, unspecified: Secondary | ICD-10-CM | POA: Insufficient documentation

## 2017-06-24 DIAGNOSIS — C3431 Malignant neoplasm of lower lobe, right bronchus or lung: Secondary | ICD-10-CM | POA: Diagnosis not present

## 2017-06-24 DIAGNOSIS — Z923 Personal history of irradiation: Secondary | ICD-10-CM | POA: Diagnosis not present

## 2017-06-24 DIAGNOSIS — M7989 Other specified soft tissue disorders: Secondary | ICD-10-CM | POA: Diagnosis not present

## 2017-06-24 DIAGNOSIS — I10 Essential (primary) hypertension: Secondary | ICD-10-CM | POA: Diagnosis not present

## 2017-06-24 DIAGNOSIS — Z9221 Personal history of antineoplastic chemotherapy: Secondary | ICD-10-CM | POA: Insufficient documentation

## 2017-06-24 DIAGNOSIS — Z79899 Other long term (current) drug therapy: Secondary | ICD-10-CM | POA: Insufficient documentation

## 2017-06-24 DIAGNOSIS — D508 Other iron deficiency anemias: Secondary | ICD-10-CM

## 2017-06-24 DIAGNOSIS — F17210 Nicotine dependence, cigarettes, uncomplicated: Secondary | ICD-10-CM | POA: Insufficient documentation

## 2017-06-24 DIAGNOSIS — R140 Abdominal distension (gaseous): Secondary | ICD-10-CM | POA: Insufficient documentation

## 2017-06-24 LAB — CBC WITH DIFFERENTIAL/PLATELET
BASOS ABS: 0 10*3/uL (ref 0–0.1)
BASOS PCT: 0 %
EOS PCT: 0 %
Eosinophils Absolute: 0 10*3/uL (ref 0–0.7)
HEMATOCRIT: 34.6 % — AB (ref 35.0–47.0)
Hemoglobin: 11.9 g/dL — ABNORMAL LOW (ref 12.0–16.0)
LYMPHS PCT: 2 %
Lymphs Abs: 0.1 10*3/uL — ABNORMAL LOW (ref 1.0–3.6)
MCH: 29.5 pg (ref 26.0–34.0)
MCHC: 34.4 g/dL (ref 32.0–36.0)
MCV: 85.7 fL (ref 80.0–100.0)
Monocytes Absolute: 0.9 10*3/uL (ref 0.2–0.9)
Monocytes Relative: 9 %
NEUTROS ABS: 8.8 10*3/uL — AB (ref 1.4–6.5)
Neutrophils Relative %: 89 %
PLATELETS: 214 10*3/uL (ref 150–440)
RBC: 4.03 MIL/uL (ref 3.80–5.20)
RDW: 20.6 % — ABNORMAL HIGH (ref 11.5–14.5)
WBC: 9.8 10*3/uL (ref 3.6–11.0)

## 2017-06-24 LAB — COMPREHENSIVE METABOLIC PANEL
ALT: 92 U/L — ABNORMAL HIGH (ref 14–54)
ANION GAP: 10 (ref 5–15)
AST: 159 U/L — ABNORMAL HIGH (ref 15–41)
Albumin: 3.1 g/dL — ABNORMAL LOW (ref 3.5–5.0)
Alkaline Phosphatase: 348 U/L — ABNORMAL HIGH (ref 38–126)
BILIRUBIN TOTAL: 1.2 mg/dL (ref 0.3–1.2)
BUN: 22 mg/dL — ABNORMAL HIGH (ref 6–20)
CALCIUM: 10.5 mg/dL — AB (ref 8.9–10.3)
CO2: 25 mmol/L (ref 22–32)
Chloride: 93 mmol/L — ABNORMAL LOW (ref 101–111)
Creatinine, Ser: 0.55 mg/dL (ref 0.44–1.00)
GFR calc Af Amer: >60 mL/min (ref >60)
Glucose, Bld: 146 mg/dL — ABNORMAL HIGH (ref 65–99)
POTASSIUM: 4 mmol/L (ref 3.5–5.1)
Sodium: 128 mmol/L — ABNORMAL LOW (ref 135–145)
TOTAL PROTEIN: 7.7 g/dL (ref 6.5–8.1)

## 2017-06-24 MED ORDER — DENOSUMAB 120 MG/1.7ML ~~LOC~~ SOLN
120.0000 mg | Freq: Once | SUBCUTANEOUS | Status: AC
  Administered 2017-06-24: 120 mg via SUBCUTANEOUS
  Filled 2017-06-24: qty 1.7

## 2017-06-24 MED ORDER — HYDROCODONE-ACETAMINOPHEN 5-325 MG PO TABS: 1.0000 | ORAL_TABLET | Freq: Three times a day (TID) | ORAL | 0 refills | Status: AC | PRN

## 2017-06-24 MED ORDER — HEPARIN SOD (PORK) LOCK FLUSH 100 UNIT/ML IV SOLN
500.0000 [IU] | Freq: Once | INTRAVENOUS | Status: AC
  Administered 2017-06-24: 500 [IU] via INTRAVENOUS
  Filled 2017-06-24: qty 5

## 2017-06-24 NOTE — Progress Notes (Signed)
Patient presents to clinic today for chemotherapy. Reports no bm x several days until this am. Patient had 1 small pebble size stool this am. Reports abd. Distention and tightness in abd girth. Reports bil. Lower extremity edema. 1+ pitting edema present. Pt is very weak. She requires an assist to get out of w/c. Unable to ambulate w/o assist due to pain in legs.

## 2017-06-24 NOTE — Assessment & Plan Note (Addendum)
#   Right lung ca- with recurrence [based on imaging]- metastases to right lung ~10-11mm;  OCT 5th PET- progression of the right upper lobe primary; right supraclav LN.   #Patient currently status post palliative radiation to the right upper lobe primary; but noted to have progressive bone metastases [bone scan Jan 2019-see discussion below].  #Given the abdominal distention/abdominal pain I suspect patient has progressive liver disease.  Recommend ultrasound of the abdomen ASAP.  LFTs are mildly elevated; HOLD chemo today.  #Given the progressive disease on clinical exam/and also borderline performance status; I would recommend holding chemotherapy at this time. # worsening back pain/bilateral rib pain- sec to mets. Pain control- fenatnyl 25 mcg; continue hydrocodne-1.   # COPD/bronchitis-STABLE. On advair/ albuterol.   # follow up with me after the ultrasound abdomen.  I would like to discuss hospice with patient and family.

## 2017-06-24 NOTE — Progress Notes (Signed)
[place] OFFICE PROGRESS NOTE  Patient Care Team: [facility] as PCP - General (General Practice)  Cancer Staging No matching staging information was found for the patient.   Oncology History   Chief Complaint/Diagnosis:   1. 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 Faith Rogue, cardiothoracic surgeon) 2. Started on radiation and concurrent chemotherapy with carboplatinum Taxol from April of 2013 3. Finished radiation and chemotherapy with carboplatinum and Taxol in May of 2013 4. Stage IIIB (T4N3M0) NSCLC who completed a course of EBRT on Oct 07, 2011 with a total dose of 7000cGy.   5. Starting consolidation with 2 cycles of carboplatinum and Abraxane 6.patient is off chemotherapy since November of 2013.  Repeat CT scan shows stable disease(January, 2014) 7  Started been on Taxotere and  RAMICIRUMAB from June 07, 2013 8.chemotherapy with Taxotere  and  RAMUCIRUMAB discontinued in March of 2015 because of hemoptysis.  9.Started onNIVOLULAMAB April of 2015. 10NIVOLULAMABWas put on hold because of abnormal liver enzymes January of 2016  # AUG 2017- Right lung recurrence [2 nodules - ~1cm; stable lung mass/fibrosis]. START OPDIVO q 2W [small to Bx; s/p rad-onc eval]; opdivo x4- CT OCT 16th- PROGRESSION 2 lung nodules ~26mm;   # JAN 2018-carbo-TAXOL s/p 2 cycles- CT- PR; but carbo-anaphylaxis; STOPPED chemo  # July 2018- PROGRESSION- GEMCITABINE; 02/12/2017- PROGRESSION [? LFTs elevated sec to Gem; Gem Discontinued]  # MID OCT 2018- Abraxane; HOLD [sepsis]; Jan 2019- RT to RUL  # Jan 2019- Progression on Bone scan; Feb 7th 2019- Abraxane  # MOLECULAR TESTING- ? Not enough tissue; Oct 2018- right supraclav Bx- for molecular testing.      Primary cancer of bronchus of right lower lobe (HCC)     INTERVAL HISTORY:  Jeanette Jones 62 y.o.   female pleasant patient above history of stage IV lung cancer squamous cell/ recurrent- currently  status post palliative radiation to the right upper lobe lung mass is here for follow-up-  and to proceed with chemotherapy given the widespread metastatic disease noted on the bone scan.  As per the patient she feels poorly-complains of worsening shortness of breath.  She also complains of abdominal distention.  Complains of pain in the right upper quadrant.  Poor appetite.  Loss of weight.   Chronic intermittent cough.  Chronic mild hemoptysis.  Patient denies any tingling and numbness of the extremities. She denies any skin rash. No diarrhea.    REVIEW OF SYSTEMS:  A complete 10 point review of system is done which is negative except mentioned above/history of present illness.   PAST MEDICAL HISTORY :  Past Medical History:  Diagnosis Date  . Back pain, chronic   . Carcinoma of lung ([place]) 07/2011  . Cough   . Depression   . Dyspnea   . Emphysema of lung ([place])   . H/O tubal ligation 02/12/2017   From chart  . Heart murmur   . Hep C w/o coma, chronic ([place])   . Hypertension   . Knee pain 02/12/2017  . Lung cancer ([place])   . Shoulder pain 02/09/2017  . Swallowing difficulty     PAST SURGICAL HISTORY :   Past Surgical History:  Procedure Laterality Date  . ESOPHAGOGASTRODUODENOSCOPY N/A 06/08/2015   Procedure: ESOPHAGOGASTRODUODENOSCOPY (EGD);  Surgeon: Hulen Luster, MD;  Location: [facility] ENDOSCOPY;  Service: Gastroenterology;  Laterality: N/A;  . HEMORROIDECTOMY    .  LUNG BIOPSY    . TUBAL LIGATION      FAMILY HISTORY :   Family History  Problem Relation Age of Onset  . Hypertension Other   . Breast cancer Neg Hx     SOCIAL HISTORY:   Social History   Tobacco Use  . Smoking status: Current Every Day Smoker    Packs/day: 0.25    Years: 40.00    Pack years: 10.00    Types: Cigarettes  . Smokeless tobacco: Never Used  Substance Use Topics  . Alcohol use: Yes    Comment:  beer on saturday  . Drug use: Yes    Types: Cocaine, Marijuana    Comment: 2-3 days ago; dr penwarden notified    ALLERGIES:  is allergic to carboplatin.  MEDICATIONS:  Current Outpatient Medications  Medication Sig Dispense Refill  . acetaminophen (TYLENOL) 160 MG/5ML solution Take 20.3 mLs (650 mg total) every 6 (six) hours as needed by mouth for mild pain or fever. (Patient not taking: Reported on 06/28/2017) 120 mL 0  . albuterol (PROVENTIL HFA;VENTOLIN HFA) 108 (90 Base) MCG/ACT inhaler Inhale 2 puffs into the lungs every 6 (six) hours as needed for wheezing or shortness of breath. 1 Inhaler 2  . cyclobenzaprine (FLEXERIL) 10 MG tablet Take 10 mg by mouth at bedtime.    . feeding supplement, ENSURE COMPLETE, (ENSURE COMPLETE) LIQD Take 237 mLs by mouth 2 (two) times daily between meals. 60 Bottle 0  . Fluticasone-Salmeterol (ADVAIR DISKUS) 500-50 MCG/DOSE AEPB Inhale 1 puff into the lungs 2 (two) times daily. 1 each 3  . Multiple Vitamin (MULTIVITAMIN) LIQD Take 15 mLs daily by mouth. 500 mL 1  . alum & mag hydroxide-simeth (MAALOX/MYLANTA) 200-200-20 MG/5ML suspension Take 15 mLs every 6 (six) hours as needed by mouth for indigestion or heartburn. (Patient not taking: Reported on 06/04/2017) 355 mL 0  . fentaNYL (DURAGESIC - DOSED MCG/HR) 50 MCG/HR Place 1 patch (50 mcg total) onto the skin every 3 (three) days. 5 patch 0  . furosemide (LASIX) 20 MG tablet Take 1 tablet (20 mg total) by mouth daily. 30 tablet 0  . HYDROcodone-acetaminophen (NORCO/VICODIN) 5-325 MG tablet Take 1 tablet by mouth every 8 (eight) hours as needed for moderate pain. 40 tablet 0  . ondansetron (ZOFRAN) 8 MG tablet Take 1 tablet (8 mg total) by mouth every 8 (eight) hours as needed for nausea or vomiting (start 3 days; after chemo). (Patient not taking: Reported on 06/04/2017) 40 tablet 1  . prochlorperazine (COMPAZINE) 10 MG tablet Take 1 tablet (10 mg total) by mouth every 6 (six) hours as needed for nausea or  vomiting. (Patient not taking: Reported on 06/04/2017) 40 tablet 1  . spironolactone (ALDACTONE) 50 MG tablet Take 1 tablet (50 mg total) by mouth daily. 30 tablet 0  . sucralfate (CARAFATE) 1 g tablet Take 1 tablet (1 g total) by mouth 4 (four) times daily -  with meals and at bedtime. Dissolve in water; prior to taking it. (Patient not taking: Reported on 06/24/2017) 45 tablet 0   No current facility-administered medications for this visit.     PHYSICAL EXAMINATION: ECOG PERFORMANCE STATUS: 0 - Asymptomatic  BP 120/85   Pulse (!) 139   Temp 97.9 F (36.6 C) (Tympanic)   Resp (!) 24   Ht 5\' 3"  (1.6 m)   Wt 121 lb (54.9 kg)   SpO2 100%   BMI 21.43 kg/m   Filed Weights   06/24/17 1003  Weight: 121 lb (  54.9 kg)    GENERAL: Moderately nourished thin build.  Alert, no distress and comfortable.   Alone.  EYES: no pallor or icterus OROPHARYNX: no thrush or ulceration; good dentition  NECK: supple, no masses felt LYMPH:  no palpable lymphadenopathy in the cervical, axillary or inguinal regions LUNGS: Decreased air entry to auscultation bilaterally and  No wheeze or crackles HEART/CVS: regular rate & rhythm and no murmurs; No lower extremity edema ABDOMEN:abdomen soft, non-tender and normal bowel sounds; positive for distention.  Positive for hepatomegaly. Musculoskeletal:no cyanosis of digits and no clubbing  PSYCH: alert & oriented x 3 with fluent speech NEURO: no focal motor/sensory deficits SKIN:  no rashes or significant lesions  LABORATORY DATA:  I have reviewed the data as listed    Component Value Date/Time   NA 128 (L) 06/24/2017 0926   NA 135 08/29/2014 1457   K 4.0 06/24/2017 0926   K 3.7 08/29/2014 1457   CL 93 (L) 06/24/2017 0926   CL 105 08/29/2014 1457   CO2 25 06/24/2017 0926   CO2 24 08/29/2014 1457   GLUCOSE 146 (H) 06/24/2017 0926   GLUCOSE 98 08/29/2014 1457   BUN 22 (H) 06/24/2017 0926   BUN 8 08/29/2014 1457   CREATININE 0.55 06/24/2017 0926    CREATININE 0.64 08/29/2014 1457   CALCIUM 10.5 (H) 06/24/2017 0926   CALCIUM 9.0 08/29/2014 1457   PROT 7.7 06/24/2017 0926   PROT 9.3 (H) 08/29/2014 1457   ALBUMIN 3.1 (L) 06/24/2017 0926   ALBUMIN 3.6 08/29/2014 1457   AST 159 (H) 06/24/2017 0926   AST 111 (H) 08/29/2014 1457   ALT 92 (H) 06/24/2017 0926   ALT 78 (H) 08/29/2014 1457   ALKPHOS 348 (H) 06/24/2017 0926   ALKPHOS 168 (H) 08/29/2014 1457   BILITOT 1.2 06/24/2017 0926   BILITOT 0.6 08/29/2014 1457   GFRNONAA >60 06/24/2017 0926   GFRNONAA >60 08/29/2014 1457   GFRAA >60 06/24/2017 0926   GFRAA >60 08/29/2014 1457    No results found for: SPEP, UPEP  Lab Results  Component Value Date   WBC 9.8 06/24/2017   NEUTROABS 8.8 (H) 06/24/2017   HGB 11.9 (L) 06/24/2017   HCT 34.6 (L) 06/24/2017   MCV 85.7 06/24/2017   PLT 214 06/24/2017      Chemistry      Component Value Date/Time   NA 128 (L) 06/24/2017 0926   NA 135 08/29/2014 1457   K 4.0 06/24/2017 0926   K 3.7 08/29/2014 1457   CL 93 (L) 06/24/2017 0926   CL 105 08/29/2014 1457   CO2 25 06/24/2017 0926   CO2 24 08/29/2014 1457   BUN 22 (H) 06/24/2017 0926   BUN 8 08/29/2014 1457   CREATININE 0.55 06/24/2017 0926   CREATININE 0.64 08/29/2014 1457      Component Value Date/Time   CALCIUM 10.5 (H) 06/24/2017 0926   CALCIUM 9.0 08/29/2014 1457   ALKPHOS 348 (H) 06/24/2017 0926   ALKPHOS 168 (H) 08/29/2014 1457   AST 159 (H) 06/24/2017 0926   AST 111 (H) 08/29/2014 1457   ALT 92 (H) 06/24/2017 0926   ALT 78 (H) 08/29/2014 1457   BILITOT 1.2 06/24/2017 0926   BILITOT 0.6 08/29/2014 1457     IMPRESSION: 1. 6 cm hypermetabolic mass anteriorly in the primarily collapsed right upper lobe, maximum SUV 13.8. Associated paratracheal, subcarinal, and supraclavicular metastatic adenopathy. 2. The masslike density in the right lower lobe has low-grade metabolic activity, potentially from prior radiation therapy.  3. 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 4. No findings of metastatic disease to the abdomen or pelvis.   Electronically Signed   By: Van Clines M.D.   On: 02/12/2017 12:31    RADIOGRAPHIC STUDIES: I have personally reviewed the radiological images as listed and agreed with the findings in the report. No results found.   ASSESSMENT & PLAN:  Primary cancer of bronchus of right lower lobe ([place]) # Right lung ca- with recurrence [based on imaging]- metastases to right lung ~10-11mm;  OCT 5th PET- progression of the right upper lobe primary; right supraclav LN.   #Patient currently status post palliative radiation to the right upper lobe primary; but noted to have progressive bone metastases [bone scan Jan 2019-see discussion below].  #Given the abdominal distention/abdominal pain I suspect patient has progressive liver disease.  Recommend ultrasound of the abdomen ASAP.  LFTs are mildly elevated; HOLD chemo today.  #Given the progressive disease on clinical exam/and also borderline performance status; I would recommend holding chemotherapy at this time. # worsening back pain/bilateral rib pain- sec to mets. Pain control- fenatnyl 25 mcg; continue hydrocodne-1.   # COPD/bronchitis-STABLE. On advair/ albuterol.   # follow up with me after the ultrasound abdomen.  I would like to discuss hospice with patient and family.    Orders Placed This Encounter  Procedures  . US Abdomen Complete    Standing Status:   Future    Number of Occurrences:   1    Standing Expiration Date:   06/24/2018    Order Specific Question:   Reason for Exam (SYMPTOM  OR DIAGNOSIS REQUIRED)    Answer:   abdominal pain; distension    Order Specific Question:   Preferred imaging location?    Answer:   [facility]  . CBC with Differential    Standing Status:   Future    Standing Expiration  Date:   06/24/2018  . Comprehensive metabolic panel    Standing Status:   Future    Standing Expiration Date:   06/24/2018      Cammie Sickle, MD 07/01/2017 8:18 PM

## 2017-06-25 ENCOUNTER — Ambulatory Visit
Admission: RE | Admit: 2017-06-25 | Discharge: 2017-06-25 | Disposition: A | Discharge status: Home / Self Care | Payer: Medicare Other | Source: Ambulatory Visit | Attending: Internal Medicine | Admitting: Internal Medicine

## 2017-06-25 DIAGNOSIS — R140 Abdominal distension (gaseous): Secondary | ICD-10-CM

## 2017-06-25 DIAGNOSIS — C3431 Malignant neoplasm of lower lobe, right bronchus or lung: Secondary | ICD-10-CM | POA: Diagnosis present

## 2017-06-25 DIAGNOSIS — K829 Disease of gallbladder, unspecified: Secondary | ICD-10-CM | POA: Diagnosis not present

## 2017-06-25 DIAGNOSIS — K769 Liver disease, unspecified: Secondary | ICD-10-CM | POA: Diagnosis not present

## 2017-06-25 DIAGNOSIS — R109 Unspecified abdominal pain: Secondary | ICD-10-CM | POA: Diagnosis present

## 2017-06-28 ENCOUNTER — Other Ambulatory Visit: Payer: Self-pay | Admitting: Student

## 2017-06-28 ENCOUNTER — Inpatient Hospital Stay (HOSPITAL_BASED_OUTPATIENT_CLINIC_OR_DEPARTMENT_OTHER): Payer: Medicare Other | Admitting: Internal Medicine

## 2017-06-28 ENCOUNTER — Other Ambulatory Visit: Payer: Self-pay | Admitting: General Surgery

## 2017-06-28 ENCOUNTER — Other Ambulatory Visit: Payer: Self-pay

## 2017-06-28 ENCOUNTER — Ambulatory Visit
Admission: RE | Admit: 2017-06-28 | Discharge: 2017-06-28 | Disposition: A | Discharge status: Home / Self Care | Payer: Medicare Other | Source: Ambulatory Visit | Attending: Internal Medicine | Admitting: Internal Medicine

## 2017-06-28 VITALS — BP 115/76 | HR 116 | Temp 97.7°F | Resp 20 | Ht 63.0 in | Wt 127.0 lb

## 2017-06-28 DIAGNOSIS — M7989 Other specified soft tissue disorders: Secondary | ICD-10-CM

## 2017-06-28 DIAGNOSIS — C7951 Secondary malignant neoplasm of bone: Secondary | ICD-10-CM | POA: Diagnosis not present

## 2017-06-28 DIAGNOSIS — C7801 Secondary malignant neoplasm of right lung: Secondary | ICD-10-CM

## 2017-06-28 DIAGNOSIS — R600 Localized edema: Secondary | ICD-10-CM | POA: Insufficient documentation

## 2017-06-28 DIAGNOSIS — C3431 Malignant neoplasm of lower lobe, right bronchus or lung: Secondary | ICD-10-CM

## 2017-06-28 DIAGNOSIS — Z79899 Other long term (current) drug therapy: Secondary | ICD-10-CM

## 2017-06-28 DIAGNOSIS — C770 Secondary and unspecified malignant neoplasm of lymph nodes of head, face and neck: Secondary | ICD-10-CM | POA: Diagnosis not present

## 2017-06-28 DIAGNOSIS — Z923 Personal history of irradiation: Secondary | ICD-10-CM | POA: Diagnosis not present

## 2017-06-28 DIAGNOSIS — Z9221 Personal history of antineoplastic chemotherapy: Secondary | ICD-10-CM

## 2017-06-28 DIAGNOSIS — R140 Abdominal distension (gaseous): Secondary | ICD-10-CM

## 2017-06-28 DIAGNOSIS — F17210 Nicotine dependence, cigarettes, uncomplicated: Secondary | ICD-10-CM

## 2017-06-28 DIAGNOSIS — J449 Chronic obstructive pulmonary disease, unspecified: Secondary | ICD-10-CM

## 2017-06-28 MED ORDER — FENTANYL 50 MCG/HR TD PT72: 50.0000 ug | MEDICATED_PATCH | TRANSDERMAL | 0 refills | Status: AC

## 2017-06-28 MED ORDER — SPIRONOLACTONE 50 MG PO TABS: 50.0000 mg | ORAL_TABLET | Freq: Every day | ORAL | 0 refills | Status: AC

## 2017-06-28 MED ORDER — FUROSEMIDE 20 MG PO TABS: 20.0000 mg | ORAL_TABLET | Freq: Every day | ORAL | 0 refills | Status: AC

## 2017-06-28 NOTE — Assessment & Plan Note (Addendum)
#   Right lung ca- with recurrence [based on imaging]- metastases to right lung ~10-87mm;  OCT 5th PET- progression of the right upper lobe primary; right supraclav LN. S/p RT to RUL [finished Jan 2019].  # FEB 2019 bone scan-progressive metastatic disease in the bones; ultrasound of the liver multiple lesions in the liver; and also consistent with cirrhosis.  Given the swelling in the legs abdominal distention-I suspect patient has decompensated liver disease.  #I had a long discussion with patient regarding in general the life expectancy of stage III lung cancer [original diagnosis 2013] is approximately 2 years.  And she definitely has lived much longer than that.  She is status post multiple lines of therapies; however in the context of the declining performance status/poor tolerance to chemotherapy/liver decompensation-continue to suspect patient will have poor therapeutic tolerance; with high risk of side effects admission to the hospital; risk of death from chemotherapy.   #However given the reasons I would recommend hospice at her home at this time.  Also discussed that if her condition declines further she might be a candidate for hospice home.   #Bilateral leg swelling check ultrasound Doppler stat; if negative I would recommend diuretics/Lasix of 20 and Aldactone 50 mg once a day.  Prescription sent.  #Hold off biopsy of the supraclavicular lymph node.  # worsening back pain/bilateral rib pain- sec to mets.  Not well controlled.  Increase fentanyl patch to 50 mcg. ;  Continue hydrocodone every 8 hours as needed.  Patient will likely need adjustment of her pain medications from hospice soon.  #No follow-up at this time unless needed.  Hospice ASAP.   #Patient and family asked multiple questions; all questions were answered to the best of my knowledge to their satisfaction.  Addendum: Ultrasound Dopplers negative for DVT.  Patient will be informed.   # I reviewed the blood work- with the  patient in detail; also reviewed the imaging independently [as summarized above]; and with the patient in detail.

## 2017-06-28 NOTE — Progress Notes (Signed)
[place] OFFICE PROGRESS NOTE  Patient Care Team: Center, [facility] as PCP - General (General Practice)  Cancer Staging No matching staging information was found for the patient.   Oncology History   Chief Complaint/Diagnosis:   1. 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 Faith Rogue, cardiothoracic surgeon) 2. Started on radiation and concurrent chemotherapy with carboplatinum Taxol from April of 2013 3. Finished radiation and chemotherapy with carboplatinum and Taxol in May of 2013 4. Stage IIIB (T4N3M0) NSCLC who completed a course of EBRT on Oct 07, 2011 with a total dose of 7000cGy.   5. Starting consolidation with 2 cycles of carboplatinum and Abraxane 6.patient is off chemotherapy since November of 2013.  Repeat CT scan shows stable disease(January, 2014) 7  Started been on Taxotere and  RAMICIRUMAB from June 07, 2013 8.chemotherapy with Taxotere  and  RAMUCIRUMAB discontinued in March of 2015 because of hemoptysis.  9.Started onNIVOLULAMAB April of 2015. 10NIVOLULAMABWas put on hold because of abnormal liver enzymes January of 2016  # AUG 2017- Right lung recurrence [2 nodules - ~1cm; stable lung mass/fibrosis]. START OPDIVO q 2W [small to Bx; s/p rad-onc eval]; opdivo x4- CT OCT 16th- PROGRESSION 2 lung nodules ~66mm;   # JAN 2018-carbo-TAXOL s/p 2 cycles- CT- PR; but carbo-anaphylaxis; STOPPED chemo  # July 2018- PROGRESSION- GEMCITABINE; 02/12/2017- PROGRESSION [? LFTs elevated sec to Gem; Gem Discontinued]  # MID OCT 2018- Abraxane; HOLD [sepsis]; Jan 2019- RT to RUL  # Jan 2019- Progression on Bone scan; Feb 7th 2019- Abraxane  # MOLECULAR TESTING- ? Not enough tissue; Oct 2018- right supraclav Bx- for molecular testing.      Primary cancer of bronchus of right lower lobe (HCC)     INTERVAL HISTORY:  Jeanette Jones 62 y.o.   female pleasant patient above history of stage IV lung cancer squamous cell/ recurrent- currently s/p palliative radiation to the right upper lobe lung mass is here for follow-up/review the results of the bone scan/ ultrasound of the liver.  Today patient is accompanied by multiple family members.  Patient complains of continued pain all over; she also complains of significant swelling in the legs left more than right.  Going on for the last 1-2 weeks.  Progressive getting worse.  She also complains of a nodule on the left chest wall.  Intermittent headaches.  she feels poorly.  Poor appetite.  Positive for nausea no vomiting.  Chronic difficult swallowing.  Chronic cough chronic shortness of breath on exertion.  Patient's pain is slightly better controlled on the fentanyl patch 25 however not completely at baseline.  She is needing hydrocodone for breakthrough pain medication.  Positive for constipation; no diarrhea.  Her mobility is significantly decreased in the last few weeks.  REVIEW OF SYSTEMS:  A complete 10 point review of system is done which is negative except mentioned above/history of present illness.   PAST MEDICAL HISTORY :  Past Medical History:  Diagnosis Date  . Back pain, chronic   . Carcinoma of lung ([place]) 07/2011  . Cough   . Depression   . Dyspnea   . Emphysema of lung ([place])   . H/O tubal ligation 02/12/2017   From chart  . Heart murmur   . Hep C w/o coma, chronic ([place])   . Hypertension   . Knee pain 02/12/2017  . Lung cancer ([place])   . Shoulder  pain 02/09/2017  . Swallowing difficulty     PAST SURGICAL HISTORY :   Past Surgical History:  Procedure Laterality Date  . ESOPHAGOGASTRODUODENOSCOPY N/A 06/08/2015   Procedure: ESOPHAGOGASTRODUODENOSCOPY (EGD);  Surgeon: Hulen Luster, MD;  Location: [facility] ENDOSCOPY;  Service: Gastroenterology;  Laterality: N/A;  . HEMORROIDECTOMY    . LUNG BIOPSY    . TUBAL LIGATION      FAMILY HISTORY :   Family History  Problem  Relation Age of Onset  . Hypertension Other   . Breast cancer Neg Hx     SOCIAL HISTORY:   Social History   Tobacco Use  . Smoking status: Current Every Day Smoker    Packs/day: 0.25    Years: 40.00    Pack years: 10.00    Types: Cigarettes  . Smokeless tobacco: Never Used  Substance Use Topics  . Alcohol use: Yes    Comment: beer on saturday  . Drug use: Yes    Types: Cocaine, Marijuana    Comment: 2-3 days ago; dr penwarden notified    ALLERGIES:  is allergic to carboplatin.  MEDICATIONS:  Current Outpatient Medications  Medication Sig Dispense Refill  . albuterol (PROVENTIL HFA;VENTOLIN HFA) 108 (90 Base) MCG/ACT inhaler Inhale 2 puffs into the lungs every 6 (six) hours as needed for wheezing or shortness of breath. 1 Inhaler 2  . feeding supplement, ENSURE COMPLETE, (ENSURE COMPLETE) LIQD Take 237 mLs by mouth 2 (two) times daily between meals. 60 Bottle 0  . fentaNYL (DURAGESIC - DOSED MCG/HR) 50 MCG/HR Place 1 patch (50 mcg total) onto the skin every 3 (three) days. 5 patch 0  . Fluticasone-Salmeterol (ADVAIR DISKUS) 500-50 MCG/DOSE AEPB Inhale 1 puff into the lungs 2 (two) times daily. 1 each 3  . HYDROcodone-acetaminophen (NORCO/VICODIN) 5-325 MG tablet Take 1 tablet by mouth every 8 (eight) hours as needed for moderate pain. 40 tablet 0  . Multiple Vitamin (MULTIVITAMIN) LIQD Take 15 mLs daily by mouth. 500 mL 1  . acetaminophen (TYLENOL) 160 MG/5ML solution Take 20.3 mLs (650 mg total) every 6 (six) hours as needed by mouth for mild pain or fever. (Patient not taking: Reported on 06/28/2017) 120 mL 0  . alum & mag hydroxide-simeth (MAALOX/MYLANTA) 200-200-20 MG/5ML suspension Take 15 mLs every 6 (six) hours as needed by mouth for indigestion or heartburn. (Patient not taking: Reported on 06/04/2017) 355 mL 0  . cyclobenzaprine (FLEXERIL) 10 MG tablet Take 10 mg by mouth at bedtime.    . furosemide (LASIX) 20 MG tablet Take 1 tablet (20 mg total) by mouth daily. 30 tablet  0  . ondansetron (ZOFRAN) 8 MG tablet Take 1 tablet (8 mg total) by mouth every 8 (eight) hours as needed for nausea or vomiting (start 3 days; after chemo). (Patient not taking: Reported on 06/04/2017) 40 tablet 1  . prochlorperazine (COMPAZINE) 10 MG tablet Take 1 tablet (10 mg total) by mouth every 6 (six) hours as needed for nausea or vomiting. (Patient not taking: Reported on 06/04/2017) 40 tablet 1  . spironolactone (ALDACTONE) 50 MG tablet Take 1 tablet (50 mg total) by mouth daily. 30 tablet 0  . sucralfate (CARAFATE) 1 g tablet Take 1 tablet (1 g total) by mouth 4 (four) times daily -  with meals and at bedtime. Dissolve in water; prior to taking it. (Patient not taking: Reported on 06/24/2017) 45 tablet 0   No current facility-administered medications for this visit.     PHYSICAL EXAMINATION: ECOG PERFORMANCE STATUS: 3 - Symptomatic, >  50% confined to bed  BP 115/76 (BP Location: Left Arm, Patient Position: Sitting)   Pulse (!) 116   Temp 97.7 F (36.5 C) (Tympanic)   Resp 20   Ht 5\' 3"  (1.6 m)   Wt 127 lb (57.6 kg)   BMI 22.50 kg/m   Filed Weights   06/28/17 0931  Weight: 127 lb (57.6 kg)    GENERAL: Moderately nourished thin build.  Alert, no distress and comfortable.   Accompanied by multiple family members.  She is in a wheelchair. EYES: no pallor or icterus OROPHARYNX: no thrush or ulceration; good dentition  NECK: supple, no masses felt LYMPH:  no palpable lymphadenopathy in the cervical, axillary or inguinal regions LUNGS: Decreased air entry to auscultation bilaterally and  No wheeze or crackles HEART/CVS: regular rate & rhythm and no murmurs; bilateral lower extremity edema 2+; left more than right. ABDOMEN:abdomen soft, non-tender and normal bowel sounds Musculoskeletal:no cyanosis of digits and no clubbing  PSYCH: alert & oriented x 3 with fluent speech NEURO: no focal motor/sensory deficits SKIN:  no rashes; nodule noted in the left lateral chest wall  approximately 2 cm size.  LABORATORY DATA:  I have reviewed the data as listed    Component Value Date/Time   NA 128 (L) 06/24/2017 0926   NA 135 08/29/2014 1457   K 4.0 06/24/2017 0926   K 3.7 08/29/2014 1457   CL 93 (L) 06/24/2017 0926   CL 105 08/29/2014 1457   CO2 25 06/24/2017 0926   CO2 24 08/29/2014 1457   GLUCOSE 146 (H) 06/24/2017 0926   GLUCOSE 98 08/29/2014 1457   BUN 22 (H) 06/24/2017 0926   BUN 8 08/29/2014 1457   CREATININE 0.55 06/24/2017 0926   CREATININE 0.64 08/29/2014 1457   CALCIUM 10.5 (H) 06/24/2017 0926   CALCIUM 9.0 08/29/2014 1457   PROT 7.7 06/24/2017 0926   PROT 9.3 (H) 08/29/2014 1457   ALBUMIN 3.1 (L) 06/24/2017 0926   ALBUMIN 3.6 08/29/2014 1457   AST 159 (H) 06/24/2017 0926   AST 111 (H) 08/29/2014 1457   ALT 92 (H) 06/24/2017 0926   ALT 78 (H) 08/29/2014 1457   ALKPHOS 348 (H) 06/24/2017 0926   ALKPHOS 168 (H) 08/29/2014 1457   BILITOT 1.2 06/24/2017 0926   BILITOT 0.6 08/29/2014 1457   GFRNONAA >60 06/24/2017 0926   GFRNONAA >60 08/29/2014 1457   GFRAA >60 06/24/2017 0926   GFRAA >60 08/29/2014 1457    No results found for: SPEP, UPEP  Lab Results  Component Value Date   WBC 9.8 06/24/2017   NEUTROABS 8.8 (H) 06/24/2017   HGB 11.9 (L) 06/24/2017   HCT 34.6 (L) 06/24/2017   MCV 85.7 06/24/2017   PLT 214 06/24/2017      Chemistry      Component Value Date/Time   NA 128 (L) 06/24/2017 0926   NA 135 08/29/2014 1457   K 4.0 06/24/2017 0926   K 3.7 08/29/2014 1457   CL 93 (L) 06/24/2017 0926   CL 105 08/29/2014 1457   CO2 25 06/24/2017 0926   CO2 24 08/29/2014 1457   BUN 22 (H) 06/24/2017 0926   BUN 8 08/29/2014 1457   CREATININE 0.55 06/24/2017 0926   CREATININE 0.64 08/29/2014 1457      Component Value Date/Time   CALCIUM 10.5 (H) 06/24/2017 0926   CALCIUM 9.0 08/29/2014 1457   ALKPHOS 348 (H) 06/24/2017 0926   ALKPHOS 168 (H) 08/29/2014 1457   AST 159 (H) 06/24/2017 3086  AST 111 (H) 08/29/2014 1457   ALT 92 (H)  06/24/2017 0926   ALT 78 (H) 08/29/2014 1457   BILITOT 1.2 06/24/2017 0926   BILITOT 0.6 08/29/2014 1457     IMPRESSION: 1. 6 cm hypermetabolic mass anteriorly in the primarily collapsed right upper lobe, maximum SUV 13.8. Associated paratracheal, subcarinal, and supraclavicular metastatic adenopathy. 2. The masslike density in the right lower lobe has low-grade metabolic activity, potentially from prior radiation therapy. 3. 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 4. No findings of metastatic disease to the abdomen or pelvis.   Electronically Signed   By: Van Clines M.D.   On: 02/12/2017 12:31    RADIOGRAPHIC STUDIES: I have personally reviewed the radiological images as listed and agreed with the findings in the report. US Venous Img Lower Bilateral  Result Date: 06/28/2017 CLINICAL DATA:  Bilateral lower extremity edema. Primary lung cancer. EXAM: BILATERAL LOWER EXTREMITY VENOUS DOPPLER ULTRASOUND TECHNIQUE: Gray-scale sonography with graded compression, as well as color Doppler and duplex ultrasound were performed to evaluate the lower extremity deep venous systems from the level of the common femoral vein and including the common femoral, femoral, profunda femoral, popliteal and calf veins including the posterior tibial, peroneal and gastrocnemius veins when visible. The superficial great saphenous vein was also interrogated. Spectral Doppler was utilized to evaluate flow at rest and with distal augmentation maneuvers in the common femoral, femoral and popliteal veins. COMPARISON:  None. FINDINGS: RIGHT LOWER EXTREMITY Common Femoral Vein: No evidence of thrombus. Normal compressibility, respiratory phasicity and response to augmentation. Saphenofemoral Junction: No evidence of thrombus. Normal compressibility and flow on  color Doppler imaging. Profunda Femoral Vein: No evidence of thrombus. Normal compressibility and flow on color Doppler imaging. Femoral Vein: No evidence of thrombus. Normal compressibility, respiratory phasicity and response to augmentation. Popliteal Vein: No evidence of thrombus. Normal compressibility, respiratory phasicity and response to augmentation. Calf Veins: Visualized right deep calf veins are patent without thrombus. LEFT LOWER EXTREMITY Common Femoral Vein: No evidence of thrombus. Normal compressibility, respiratory phasicity and response to augmentation. Saphenofemoral Junction: No evidence of thrombus. Normal compressibility and flow on color Doppler imaging. Profunda Femoral Vein: No evidence of thrombus. Normal compressibility and flow on color Doppler imaging. Femoral Vein: No evidence of thrombus. Normal compressibility, respiratory phasicity and response to augmentation. Popliteal Vein: No evidence of thrombus. Normal compressibility, respiratory phasicity and response to augmentation. Calf Veins: Visualized left deep calf veins are patent without thrombus. Other Findings:  Subcutaneous edema in the calves. IMPRESSION: No evidence of deep venous thrombosis. Electronically Signed   By: Markus Daft M.D.   On: 06/28/2017 12:37     ASSESSMENT & PLAN:  Primary cancer of bronchus of right lower lobe ([place]) # Right lung ca- with recurrence [based on imaging]- metastases to right lung ~10-88mm;  OCT 5th PET- progression of the right upper lobe primary; right supraclav LN. S/p RT to RUL [finished Jan 2019].  # FEB 2019 bone scan-progressive metastatic disease in the bones; ultrasound of the liver multiple lesions in the liver; and also consistent with cirrhosis.  Given the swelling in the legs abdominal distention-I suspect patient has decompensated liver disease.  #I had a long discussion with patient regarding in general the life expectancy of stage III lung cancer [original diagnosis 2013] is  approximately 2 years.  And she definitely has lived much longer than that.  She  is status post multiple lines of therapies; however in the context of the declining performance status/poor tolerance to chemotherapy/liver decompensation-continue to suspect patient will have poor therapeutic tolerance; with high risk of side effects admission to the hospital; risk of death from chemotherapy.   #However given the reasons I would recommend hospice at her home at this time.  Also discussed that if her condition declines further she might be a candidate for hospice home.   #Bilateral leg swelling check ultrasound Doppler stat; if negative I would recommend diuretics/Lasix of 20 and Aldactone 50 mg once a day.  Prescription sent.  #Hold off biopsy of the supraclavicular lymph node.  # worsening back pain/bilateral rib pain- sec to mets.  Not well controlled.  Increase fentanyl patch to 50 mcg. ;  Continue hydrocodone every 8 hours as needed.  Patient will likely need adjustment of her pain medications from hospice soon.  #No follow-up at this time unless needed.  Hospice ASAP.   #Patient and family asked multiple questions; all questions were answered to the best of my knowledge to their satisfaction.  Addendum: Ultrasound Dopplers negative for DVT.  Patient will be informed.   # I reviewed the blood work- with the patient in detail; also reviewed the imaging independently [as summarized above]; and with the patient in detail.     Orders Placed This Encounter  Procedures  . US Venous Img Lower Bilateral    Standing Status:   Future    Number of Occurrences:   1    Standing Expiration Date:   08/28/2018    Order Specific Question:   Reason for Exam (SYMPTOM  OR DIAGNOSIS REQUIRED)    Answer:   leg swelling    Order Specific Question:   Preferred imaging location?    Answer:   [facility]    Order Specific Question:   Call Results- Best Contact Number?    Answer:   [phone] hold  patient      Cammie Sickle, MD 06/28/2017 1:38 PM

## 2017-06-29 ENCOUNTER — Ambulatory Visit: Admission: RE | Admit: 2017-06-29 | Payer: Medicare Other | Source: Ambulatory Visit

## 2017-06-29 ENCOUNTER — Telehealth: Payer: Self-pay | Admitting: *Deleted

## 2017-06-29 NOTE — Telephone Encounter (Signed)
-----   Message from Cammie Sickle, MD sent at 06/28/2017  1:20 PM EST ----- Please inform pt/family that Ultrasound is negative for blood clots; continue water pills [diuretics]; hospice as recommended.

## 2017-06-29 NOTE — Telephone Encounter (Signed)
Spoke with patient. Results of u/s provided.

## 2017-07-02 ENCOUNTER — Other Ambulatory Visit: Payer: Medicare Other

## 2017-07-02 ENCOUNTER — Ambulatory Visit: Payer: Medicare Other | Admitting: Internal Medicine

## 2017-07-02 ENCOUNTER — Ambulatory Visit: Payer: Medicare Other

## 2017-07-05 ENCOUNTER — Telehealth: Payer: Self-pay | Admitting: *Deleted

## 2017-07-05 NOTE — Telephone Encounter (Signed)
Please advise on sleep med

## 2017-07-05 NOTE — Telephone Encounter (Signed)
-----   Message from Jeanette Jones sent at 07/02/2017  4:23 PM EST ----- Regarding: Hospice([facility])  RX Contact: [phone] Pt needs RX for sleep.

## 2017-07-05 NOTE — Telephone Encounter (Signed)
Hospice will order off of their protocol. Please disregard message

## 2017-07-16 DEATH — deceased

## 2017-07-23 ENCOUNTER — Ambulatory Visit: Payer: Medicare Other | Admitting: Radiation Oncology

## 2017-07-27 ENCOUNTER — Ambulatory Visit: Payer: Medicare Other

## 2018-07-02 ENCOUNTER — Other Ambulatory Visit: Payer: Self-pay | Admitting: Nurse Practitioner
